# Patient Record
Sex: Female | Born: 1975
Health system: Southern US, Community
[De-identification: ages and names within clinical notes are randomized; demographics above are authoritative.]

## PROBLEM LIST (undated history)

## (undated) DIAGNOSIS — E119 Type 2 diabetes mellitus without complications: Secondary | ICD-10-CM

## (undated) DIAGNOSIS — F32A Depression, unspecified: Secondary | ICD-10-CM

## (undated) DIAGNOSIS — M549 Dorsalgia, unspecified: Secondary | ICD-10-CM

## (undated) DIAGNOSIS — D509 Iron deficiency anemia, unspecified: Secondary | ICD-10-CM

## (undated) DIAGNOSIS — F329 Major depressive disorder, single episode, unspecified: Secondary | ICD-10-CM

## (undated) DIAGNOSIS — G43909 Migraine, unspecified, not intractable, without status migrainosus: Secondary | ICD-10-CM

## (undated) DIAGNOSIS — E282 Polycystic ovarian syndrome: Secondary | ICD-10-CM

## (undated) DIAGNOSIS — F319 Bipolar disorder, unspecified: Secondary | ICD-10-CM

## (undated) DIAGNOSIS — M4712 Other spondylosis with myelopathy, cervical region: Secondary | ICD-10-CM

## (undated) DIAGNOSIS — E782 Mixed hyperlipidemia: Secondary | ICD-10-CM

## (undated) DIAGNOSIS — G43809 Other migraine, not intractable, without status migrainosus: Secondary | ICD-10-CM

## (undated) DIAGNOSIS — M4722 Other spondylosis with radiculopathy, cervical region: Secondary | ICD-10-CM

## (undated) DIAGNOSIS — I10 Essential (primary) hypertension: Secondary | ICD-10-CM

## (undated) DIAGNOSIS — F419 Anxiety disorder, unspecified: Secondary | ICD-10-CM

## (undated) DIAGNOSIS — G4733 Obstructive sleep apnea (adult) (pediatric): Secondary | ICD-10-CM

## (undated) DIAGNOSIS — E1165 Type 2 diabetes mellitus with hyperglycemia: Secondary | ICD-10-CM

## (undated) DIAGNOSIS — G5600 Carpal tunnel syndrome, unspecified upper limb: Secondary | ICD-10-CM

## (undated) DIAGNOSIS — T7840XA Allergy, unspecified, initial encounter: Secondary | ICD-10-CM

## (undated) HISTORY — DX: Migraine, unspecified, not intractable, without status migrainosus: G43.909

## (undated) HISTORY — PX: OTHER SURGICAL HISTORY: SHX169

## (undated) HISTORY — PX: TONSILLECTOMY: SUR1361

## (undated) HISTORY — DX: Allergy, unspecified, initial encounter: T78.40XA

## (undated) HISTORY — PX: PILONIDAL CYST EXCISION: SHX744

## (undated) HISTORY — PX: SKIN BIOPSY: SHX1

## (undated) HISTORY — DX: Anxiety disorder, unspecified: F41.9

## (undated) HISTORY — DX: Depression, unspecified: F32.A

## (undated) HISTORY — DX: Type 2 diabetes mellitus without complications: E11.9

## (undated) HISTORY — PX: OVARIAN CYST REMOVAL: SHX89

## (undated) HISTORY — DX: Major depressive disorder, single episode, unspecified: F32.9

---
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
## 1998-02-03 HISTORY — PX: LAPAROSCOPY: SHX197

## 2004-02-04 HISTORY — PX: COLONOSCOPY: SHX174

## 2007-05-05 HISTORY — PX: ABDOMINAL HYSTERECTOMY: SHX81

## 2009-08-22 ENCOUNTER — Ambulatory Visit: Payer: Self-pay | Admitting: Family Medicine

## 2010-08-22 ENCOUNTER — Emergency Department: Payer: Self-pay | Admitting: Pediatrics

## 2010-11-13 ENCOUNTER — Ambulatory Visit: Payer: Self-pay | Admitting: Oncology

## 2011-05-06 ENCOUNTER — Inpatient Hospital Stay: Payer: Self-pay | Admitting: Psychiatry

## 2011-05-06 LAB — COMPREHENSIVE METABOLIC PANEL
Albumin: 3.8 g/dL (ref 3.4–5.0)
Alkaline Phosphatase: 76 U/L (ref 50–136)
Anion Gap: 10 (ref 7–16)
Chloride: 106 mmol/L (ref 98–107)
Co2: 26 mmol/L (ref 21–32)
Creatinine: 0.73 mg/dL (ref 0.60–1.30)
EGFR (Non-African Amer.): >60
Glucose: 81 mg/dL (ref 65–99)
Osmolality: 282 (ref 275–301)
Potassium: 3.8 mmol/L (ref 3.5–5.1)
SGOT(AST): 90 U/L — ABNORMAL HIGH (ref 15–37)

## 2011-05-06 LAB — DRUG SCREEN, URINE
Benzodiazepine, Ur Scrn: NEGATIVE (ref ?–200)
Cannabinoid 50 Ng, Ur ~~LOC~~: NEGATIVE (ref ?–50)
Cocaine Metabolite,Ur ~~LOC~~: NEGATIVE (ref ?–300)
Opiate, Ur Screen: NEGATIVE (ref ?–300)
Phencyclidine (PCP) Ur S: NEGATIVE (ref ?–25)
Tricyclic, Ur Screen: NEGATIVE (ref ?–1000)

## 2011-05-06 LAB — CBC
HCT: 41.2 % (ref 35.0–47.0)
HGB: 13.9 g/dL (ref 12.0–16.0)
MCH: 31.4 pg (ref 26.0–34.0)
MCV: 93 fL (ref 80–100)

## 2011-05-06 LAB — WET PREP, GENITAL

## 2011-05-06 LAB — ACETAMINOPHEN LEVEL: Acetaminophen: <2 ug/mL

## 2011-05-06 LAB — SALICYLATE LEVEL: Salicylates, Serum: <1.7 mg/dL

## 2011-05-06 LAB — ETHANOL: Ethanol %: <0.003 % (ref 0.000–0.080)

## 2011-06-25 ENCOUNTER — Emergency Department: Payer: Self-pay | Admitting: Unknown Physician Specialty

## 2011-06-25 LAB — URINALYSIS, COMPLETE
Bilirubin,UR: NEGATIVE
Glucose,UR: NEGATIVE mg/dL (ref 0–75)
Ketone: NEGATIVE
Leukocyte Esterase: NEGATIVE
Ph: 5 (ref 4.5–8.0)
RBC,UR: <1 /HPF (ref 0–5)
Specific Gravity: 1.015 (ref 1.003–1.030)
Squamous Epithelial: 2

## 2011-06-25 LAB — CBC
HGB: 13.4 g/dL (ref 12.0–16.0)
MCV: 91 fL (ref 80–100)
Platelet: 215 10*3/uL (ref 150–440)
RBC: 4.27 10*6/uL (ref 3.80–5.20)
WBC: 8.2 10*3/uL (ref 3.6–11.0)

## 2011-06-25 LAB — COMPREHENSIVE METABOLIC PANEL
Albumin: 3.5 g/dL (ref 3.4–5.0)
Alkaline Phosphatase: 77 U/L (ref 50–136)
BUN: 13 mg/dL (ref 7–18)
Calcium, Total: 9.3 mg/dL (ref 8.5–10.1)
Chloride: 100 mmol/L (ref 98–107)
Co2: 29 mmol/L (ref 21–32)
EGFR (African American): >60
EGFR (Non-African Amer.): >60
Osmolality: 275 (ref 275–301)
SGOT(AST): 45 U/L — ABNORMAL HIGH (ref 15–37)
SGPT (ALT): 47 U/L
Sodium: 137 mmol/L (ref 136–145)

## 2011-06-25 LAB — LIPASE, BLOOD: Lipase: 122 U/L (ref 73–393)

## 2012-06-03 ENCOUNTER — Emergency Department: Payer: Self-pay | Admitting: Emergency Medicine

## 2012-06-03 LAB — URINALYSIS, COMPLETE
Bilirubin,UR: NEGATIVE
Blood: NEGATIVE
Glucose,UR: NEGATIVE mg/dL (ref 0–75)
Leukocyte Esterase: NEGATIVE
Ph: 9 (ref 4.5–8.0)
RBC,UR: 1 /HPF (ref 0–5)
Specific Gravity: 1.018 (ref 1.003–1.030)
Squamous Epithelial: 11
WBC UR: 1 /HPF (ref 0–5)

## 2012-06-03 LAB — CBC
HCT: 36.4 % (ref 35.0–47.0)
HGB: 12.8 g/dL (ref 12.0–16.0)
MCHC: 35 g/dL (ref 32.0–36.0)
Platelet: 298 10*3/uL (ref 150–440)

## 2012-06-03 LAB — BASIC METABOLIC PANEL
BUN: 10 mg/dL (ref 7–18)
Chloride: 104 mmol/L (ref 98–107)
Creatinine: 0.68 mg/dL (ref 0.60–1.30)
EGFR (African American): >60
Glucose: 94 mg/dL (ref 65–99)
Osmolality: 276 (ref 275–301)
Potassium: 2.7 mmol/L — ABNORMAL LOW (ref 3.5–5.1)
Sodium: 139 mmol/L (ref 136–145)

## 2012-06-08 ENCOUNTER — Emergency Department: Payer: Self-pay | Admitting: Emergency Medicine

## 2012-06-08 LAB — URINALYSIS, COMPLETE
Bacteria: NONE SEEN
Blood: NEGATIVE
Glucose,UR: NEGATIVE mg/dL (ref 0–75)
Leukocyte Esterase: NEGATIVE
Nitrite: NEGATIVE
Protein: NEGATIVE
RBC,UR: 4 /HPF (ref 0–5)
Specific Gravity: 1.023 (ref 1.003–1.030)
WBC UR: 1 /HPF (ref 0–5)

## 2012-06-08 LAB — COMPREHENSIVE METABOLIC PANEL
Albumin: 3.8 g/dL (ref 3.4–5.0)
Alkaline Phosphatase: 75 U/L (ref 50–136)
Anion Gap: 9 (ref 7–16)
Bilirubin,Total: 0.3 mg/dL (ref 0.2–1.0)
Chloride: 102 mmol/L (ref 98–107)
Creatinine: 0.73 mg/dL (ref 0.60–1.30)
Glucose: 109 mg/dL — ABNORMAL HIGH (ref 65–99)
Potassium: 2.9 mmol/L — ABNORMAL LOW (ref 3.5–5.1)
SGPT (ALT): 49 U/L (ref 12–78)
Sodium: 136 mmol/L (ref 136–145)
Total Protein: 7.9 g/dL (ref 6.4–8.2)

## 2012-06-08 LAB — CBC
HCT: 38.1 % (ref 35.0–47.0)
HGB: 13.5 g/dL (ref 12.0–16.0)
MCH: 32.2 pg (ref 26.0–34.0)
MCHC: 35.5 g/dL (ref 32.0–36.0)
MCV: 91 fL (ref 80–100)
WBC: 10.9 10*3/uL (ref 3.6–11.0)

## 2012-06-08 LAB — MAGNESIUM: Magnesium: 1.7 mg/dL — ABNORMAL LOW

## 2013-05-09 DIAGNOSIS — R1310 Dysphagia, unspecified: Secondary | ICD-10-CM | POA: Insufficient documentation

## 2013-05-09 DIAGNOSIS — J029 Acute pharyngitis, unspecified: Secondary | ICD-10-CM | POA: Insufficient documentation

## 2013-07-23 ENCOUNTER — Emergency Department: Payer: Self-pay | Admitting: Emergency Medicine

## 2013-12-05 ENCOUNTER — Ambulatory Visit: Payer: Self-pay | Admitting: Physician Assistant

## 2013-12-15 DIAGNOSIS — S60529A Blister (nonthermal) of unspecified hand, initial encounter: Secondary | ICD-10-CM | POA: Insufficient documentation

## 2013-12-15 DIAGNOSIS — M722 Plantar fascial fibromatosis: Secondary | ICD-10-CM | POA: Insufficient documentation

## 2014-02-18 ENCOUNTER — Emergency Department: Payer: Self-pay

## 2014-05-28 NOTE — H&P (Signed)
PATIENT NAME:  Nicole Cervantes, Nicole Cervantes MR#:  [id number] DATE OF BIRTH:  Jan 25, 1976  DATE OF ADMISSION:  05/06/2011  CHIEF COMPLAINT AND IDENTIFYING DATA: Nicole Cervantes is a 39 year old female presenting to the Emergency Department with complaints of depression and wishing that a car would swerve into her. She also was having thoughts of cutting herself.   HISTORY OF PRESENT ILLNESS: She was able to obtain a full time job approximately one month ago. She continues with bill stress. She has a son who has had psychiatric disorders. She was experiencing normal mood until approximately seven days ago when she began to experience progressive drop in her mood along with decreased energy and difficulty concentrating. It progressed to crying easily and the symptoms described above.   She has no hallucinations or delusions. Her orientation and memory function are intact. She does have a history of hypomania. However, she has not experienced any recent elevation of mood. She has continued to take her Paxil 40 mg daily. She had to leave work because she could not take her symptoms anymore. She also notes that she has had a change in the nature of her dreams. She is having nightmares. Other thoughts of killing herself include the method of overdose.   PAST PSYCHIATRIC HISTORY: Nicole Cervantes does have a history of several episodes of elevated mood, some lasting longer than seven days where she will experience racing thoughts, euphoria, increased goal-directed activity, and increased self esteem. She also has experienced short periods of elevated mood lasting 1 or 2 days followed by dropping back into a deep depression. These elevated mood periods have occurred without antidepressants and without stimulants.   She has no periods of elevated mood involving any destructive activities. She does state that when she has an elevated mood she is like the "head of the pack" at her work place.   She acknowledges a suicide attempt  of cutting herself when she was 39 years old. She was admitted to a psychiatric hospital at that time.   She has no history of hallucinations or delusions. She denies any history of psychotropic medication other than Paxil. She has remained on Paxil 40 mg q. a.m.   She was in psychotherapy. Her last psychotherapy was with Jeronimo Norma at [place]. This discontinued in November 2012.   FAMILY PSYCHIATRIC HISTORY: She has two sisters diagnosed with bipolar disorder. Also, her son has been diagnosed with psychiatric conditions and has been treated. He has been admitted to [facility] in [place].   SOCIAL HISTORY: Nicole Cervantes lives with a supportive fiance and her 15 year old son. She does not use alcohol or illegal drugs. She has never been incarcerated or arrested. She works at a [employer].   MEDICATIONS:  1. Paxil 40 mg daily.  2. Neurontin 300 mg t.i.d. 3. Hydrochlorothiazide 12.5 mg daily.  PAST MEDICAL HISTORY:   1. Nicole Cervantes has a history of cervical neuropathy with pressure on nerves from bulges on the left side of her neck. Her neuropathy symptoms involve some tingling and numbness throughout the left upper extremity. There is no loss of muscle strength. The paresthesias and numbness come and go intermittently and have been relieved with physical therapy. She and her provider have opted to not use surgery yet.   2. She has hypertension.  ALLERGIES: No known drug allergies.    REVIEW OF SYSTEMS: Constitutional, HEENT, mouth, neurologic, psychiatric, cardiovascular, respiratory, gastrointestinal, genitourinary, skin, musculoskeletal, hematologic, lymphatic, endocrine, metabolic all unremarkable.   PHYSICAL EXAMINATION:  VITAL  SIGNS: Temperature 97.2, pulse 71, respiratory rate 18, blood pressure 153/103.   The patient is examined with female staff escort.   GENERAL APPEARANCE: Nicole Cervantes is a well-developed, well-nourished, middle-aged female lying in a supine  position on her hospital gurney with no abnormal involuntary movements. She has no cachexia. Her muscle tone is normal. Her grooming and hygiene are normal.   HEENT: Normocephalic, atraumatic. Pupils equally round and reactive to light and accommodation. Oropharynx clear without erythema. Head supple, nontender, no masses.   RESPIRATORY: Clear to auscultation. No wheezing, rhonchi, or rales.   EXTREMITIES: No cyanosis, clubbing, or edema.   SKIN: Normal turgor. No rashes.   CARDIOVASCULAR: Regular rate and rhythm. No murmurs, rubs, or gallops.   ABDOMEN: Bowel sounds positive. Soft, nontender.   GENITOURINARY: Deferred.   NEUROLOGIC: Cranial nerves II through XII intact. General sensory intact throughout to light touch. Motor 5/5 throughout. Deep tendon reflexes normal strength and symmetry throughout except slightly decreased at the left elbow compared to the right elbow. Coordination intact bilaterally to finger-to-nose. No Babinski.   MENTAL STATUS EXAM: Nicole Cervantes is alert. Her eye contact is good. Her concentration is mildly decreased. She is oriented to all spheres. Her memory is intact to immediate, recent, and remote. Her fund of knowledge, use of language, and intelligence are normal. Speech is mildly slowed. She has normal prosody and no dysarthria. Thought process is mildly slowed, logical, coherent, and goal-directed. No looseness of associations or tangents. Thought content: Please see the history of present illness. She has no thoughts of harming herself, delusions, or hallucinations. Affect is constricted with tears. Mood is depressed. Insight is intact. Judgment is intact for the need of psychiatric care.   ASSESSMENT:  AXIS I:  Bipolar II disorder, depressed.   AXIS II:  Deferred.  AXIS III: Hypertension. Please see the past medical history.   AXIS IV: Occupational, general medical.   AXIS V:  30.  Nicole Cervantes does have a history of suicide attempts. She has had  a recurrence of severe depression symptoms refractory to ongoing treatment. She would be at risk to attempt suicide outside of the supportive environment of the hospital.   PLAN:  1. We will admit Nicole Cervantes to the inpatient behavioral unit. She will undergo milieu and group psychotherapy.  2. The indications, alternatives, and adverse effects of the following were discussed with Nicole Cervantes: Lamictal as a mood stabilizer, including the risk of lethal rash; Paxil as her antidepressant. She understands and wants to proceed with Lamictal and Paxil.  3. We will continue Paxil at 40 mg daily. We will start Lamictal at 25 mg daily.  4. She will have followup with her primary care physician after hospitalization.   ____________________________ Drue Stager. Jhoselin Crume, MD jsw:bjt D: 05/06/2011 14:13:13 ET T: 05/06/2011 14:31:22 ET JOB#: [id number]  cc: Drue Stager. Miarose Lippert, MD, <Dictator> Billie Ruddy MD ELECTRONICALLY SIGNED 05/08/2011 20:31

## 2014-05-28 NOTE — Discharge Summary (Signed)
PATIENT NAME:  Nicole Cervantes, Nicole Cervantes MR#:  [id number] DATE OF BIRTH:  12-13-1975  DATE OF ADMISSION:  05/06/2011 DATE OF DISCHARGE:  05/08/2011  HISTORY OF PRESENT ILLNESS: [name] is a 39 year old female admitted to the inpatient behavioral health unit due to acute depression as well as suicidal thoughts. She was having thoughts of cutting herself and she was also wishing that a car would swerve into her. She had been experiencing seven days of progressive drop in her mood along with decreased energy and difficulty concentrating. She had been crying easily.   Please see the admission History and Physical dictation.   ANCILLARY CLINICAL DATA:  The patient continued to demonstrate mild hypertension despite her HCTZ dosing. By April 4 she was still showing 149/96 and standing 134/94. Norvasc 5 mg daily was added to her regimen upon discharge. She is to follow up with her primary care physician within the week of discharge.   HOSPITAL COURSE: [name] was admitted to the inpatient behavioral health unit and underwent milieu and group psychotherapy. She was started on Lamictal at 25 mg every a.m. She continued on her Paxil 40 mg daily. She did require Vistaril for anti- acute insomnia. Her mood progressively improved.   CONDITION ON DISCHARGE: By the fourth of April [name] is sleeping well and she is very relieved because she has not slept well in weeks. Her energy is normal. Concentration is normal. Her crying has stopped. She has constructive future goals and interests. She is tolerating her Lamictal well without adverse effect. She is looking forward to returning to work.   MENTAL STATUS EXAM UPON DISCHARGE: [name] is alert. Her eye contact is good. Her concentration is normal. She is oriented to all spheres. Her memory is intact to immediate, recent, and remote. Fund of knowledge, intelligence, and use of language are normal. Speech involves normal rate and prosody without  dysarthria. Thought process is logical, coherent, and goal directed. No looseness of associations or tangents. Thought content: No thoughts of harming herself, no thoughts of harming others. No delusions or hallucinations. Affect broad and appropriate. Mood within normal limits. Insight intact. Judgment intact.    DISCHARGE DIAGNOSES:  AXIS I:  Bipolar II disorder, depressed, in clinical remission.   AXIS II: Deferred.   AXIS III: Hypertension.   AXIS IV: Occupational, general medical.   AXIS V: 55.   [name] is not at risk to harm herself or others. She agrees to call emergency services immediately for any thoughts of harming herself, thoughts of harming others, or distress.   She agrees to not drive if drowsy.   DISCHARGE MEDICATIONS:  1. Vistaril 25 mg, 1 to 3 p.o. at bedtime p.r.n. insomnia 10-day supply. 2. Paxil 40 mg daily, 10-day supply. 3. Lamotrigine 25 mg, 1 daily, 10-day supply. 4. Neurontin 300 mg, 1  t.i.d., 10-day supply.  5. HCTZ 12.5 mg daily, 10-day supply. 6. Norvasc 5 mg daily, 10-day supply.   DIET:  Regular.  ACTIVITY:  Routine.  FOLLOWUP:  1. She will follow up with [facility] on 04/08 at 11:30 a.m.  2. She will follow up with her primary care physician within seven days of discharge.   ____________________________ Drue Stager. Jalena Vanderlinden, MD jsw:bjt D: 05/09/2011 21:20:49 ET T: 05/12/2011 12:39:57 ET JOB#: [id number] Billie Ruddy MD ELECTRONICALLY SIGNED 05/15/2011 0:06

## 2014-09-15 ENCOUNTER — Ambulatory Visit
Admission: EM | Admit: 2014-09-15 | Discharge: 2014-09-15 | Disposition: A | Discharge status: Home / Self Care | Payer: Worker's Compensation | Attending: Family Medicine | Admitting: Family Medicine

## 2014-09-15 ENCOUNTER — Encounter: Payer: Self-pay | Admitting: Emergency Medicine

## 2014-09-15 DIAGNOSIS — M545 Low back pain, unspecified: Secondary | ICD-10-CM

## 2014-09-15 DIAGNOSIS — M5090 Cervical disc disorder, unspecified, unspecified cervical region: Secondary | ICD-10-CM | POA: Diagnosis not present

## 2014-09-15 HISTORY — DX: Essential (primary) hypertension: I10

## 2014-09-15 MED ORDER — KETOROLAC TROMETHAMINE 60 MG/2ML IM SOLN: 60.0000 mg | Freq: Once | INTRAMUSCULAR | Status: DC

## 2014-09-15 MED ORDER — MELOXICAM 7.5 MG PO TABS
7.5000 mg | ORAL_TABLET | Freq: Two times a day (BID) | ORAL | Status: DC
Start: 2014-09-15 — End: 2014-09-21

## 2014-09-15 MED ORDER — ORPHENADRINE CITRATE ER 100 MG PO TB12: 100.0000 mg | ORAL_TABLET | Freq: Two times a day (BID) | ORAL | Status: DC

## 2014-09-15 MED ORDER — KETOROLAC TROMETHAMINE 60 MG/2ML IM SOLN
60.0000 mg | Freq: Once | INTRAMUSCULAR | Status: AC
  Administered 2014-09-15: 60 mg via INTRAMUSCULAR

## 2014-09-15 MED ORDER — HYDROCODONE-ACETAMINOPHEN 5-325 MG PO TABS: 1.0000 | ORAL_TABLET | Freq: Every evening | ORAL | Status: DC | PRN

## 2014-09-15 NOTE — ED Notes (Signed)
Pt lifting boxes now with lower back pain with arms numbness

## 2014-09-15 NOTE — ED Provider Notes (Addendum)
CSN: [id number]     Arrival date & time 09/15/14  0840 History   First MD Initiated Contact with Patient 09/15/14 [id number]     Chief Complaint  Patient presents with  . Back Pain   (Consider location/radiation/quality/duration/timing/severity/associated sxs/prior Treatment) HPI 39 yo F reports lifting 30 lb buckets at work yesterday -moving them from a cart to shelving up to shoulder high. She  States she was "OK at Work" but during the night her neck and back developed pain. Also reports fleeting moments of numbness and tingling In both legs on the "area you touch when you hang your arms /hands down".  Also noted intermittent left upper arm numbness and reports both hands had tingling and numbness during the night . Used naproxen at home without improvement..Denies bladder or bowel difficulty, no incontinence. This morning she accomplished full self care and drove herself to work. Became increasingly uncomfortable and was sent in for evaluation.  Patient  gives history of bulging cervical  disc C4-5-6 in 2012 with left arm symptoms that had resolved in interim. Has 2015 hx of back pain presumed from work environment, approx 1 year ago.  Husband with recent cardiac diagnosis and both are on weight loss program and exercise. She has had 8 pound loss intentionally. Norvasc and HCTZ 25 mg  Past Medical History  Diagnosis Date  . Hypertension    Past Surgical History  Procedure Laterality Date  . Tonsillectomy    . Skin biopsy    . Abdominal hysterectomy  2009  . Ovarian cyst removal     Family History  Problem Relation Age of Onset  . Diabetes Mother   . Bladder Cancer Father   . Prostate cancer Father   . Diabetes Father    Social History  Substance Use Topics  . Smoking status: Never Smoker   . Smokeless tobacco: Never Used  . Alcohol Use: No   OB History    No data available     Review of Systems Constitutional: No fever.  Eyes: No visual changes. ENT:No sore  throat. Cardiovascular:Negative for chest pain/palpitations Respiratory: Negative for shortness of breath Gastrointestinal: No abdominal pain. No nausea,vomiting, diarrhea Genitourinary: Negative for dysuria. Normal urination. Musculoskeletal: Negative for neck discomfort, positive paresthesia left lateral upper arm intermittent. Positive  for  Low back pain. Reporting fleeting paresthesia lateral lower extremities Skin: Negative for rash Neurological: Negative for headache, focal weakness. Numbness/tingling as reported  Allergies  Review of patient's allergies indicates no known allergies.  Home Medications   Prior to Admission medications   Medication Sig Start Date End Date Taking? Authorizing Provider  amLODipine (NORVASC) 10 MG tablet Take 10 mg by mouth daily.   Yes Historical Provider, MD  hydrochlorothiazide (HYDRODIURIL) 25 MG tablet Take 25 mg by mouth daily.   Yes Historical Provider, MD  PARoxetine (PAXIL) 40 MG tablet Take 40 mg by mouth every morning.   Yes Historical Provider, MD  HYDROcodone-acetaminophen (NORCO/VICODIN) 5-325 MG per tablet Take 1-2 tablets by mouth at bedtime and may repeat dose one time if needed. 09/15/14   Jan Fireman, PA-C  meloxicam (MOBIC) 7.5 MG tablet Take 1 tablet (7.5 mg total) by mouth 2 (two) times daily. 09/15/14   Jan Fireman, PA-C  orphenadrine (NORFLEX) 100 MG tablet Take 1 tablet (100 mg total) by mouth 2 (two) times daily. 09/15/14   Jan Fireman, PA-C   BP 124/84 mmHg  Pulse 77  Temp(Src) 97.9 F (36.6 C) (Tympanic)  Resp 18  Ht 5' 4.5" (1.638 m)  Wt 224 lb (101.606 kg)  BMI 37.87 kg/m2  SpO2 100% Physical Exam   Constitutional -alert and oriented,reports moderate distress- she is shaking her fingers and reporting tingling; moving carefully and guarding low back.  Can sit and stand -either for short periods of time, transition between positions is uncomfortable; supine on table comfortable with knees flexed Head-atraumatic,  normocephalic Eyes- conjunctiva normal, EOMI ,conjugate gaze Ears - Neg bilat Nose- no congestion or rhinorrhea Mouth/throat- mucous membranes moist ,oropharynx non-erythematous Neck- supple without glandular enlargement CV- regular rate, grossly normal heart sounds,  Resp-no distress, normal respiratory effort,clear to auscultation bilaterally Back- no CVAT, palpation of lumbar paraspinous area is tender GI- soft,non-tender,no distention GU- not examined MSK- can move all extremities without pain in the extremity; fleeting paresthesia does not appear related to position or function;  SLR bilaterally causes localized right low back discomfort without radiation, good pulses Neuro- normal speech and language, no gross focal neurological deficit appreciated, Hyper-reflexia noted bilateral LEs, good plantar flexion, no foot drop ;  climbs on and off table without assistance CNs as tested are WNL Skin-warm,dry ,intact; no rash noted Psych-mood and affect grossly normal; speech and behavior grossly normal ED Course  Procedures (including critical care time) Labs Review Labs Reviewed - No data to display  Imaging Review No results found.  Toradol 60 mg given IM - well tolerated by patient   Patient reviewed with Dr Alveta Heimlich including history and physical findings. Impression of exacerbation of disc disease and low back pain. Treat for muscle spasm, anti-inflammmatory - may use evening dose of Vicodin MDM   1. Midline low back pain without sciatica   2. Cervical disc disease    Exacerbation of disc disease suspected. Diagnosis and treatment discussed. . Questions fielded, expectations and recommendations reviewed.  Patient expresses understanding. Will return to [facility] with questions, concern or exacerbation.   Discharge Medication List as of 09/15/2014 10:44 AM    START taking these medications   Details  HYDROcodone-acetaminophen (NORCO/VICODIN) 5-325 MG per tablet Take 1-2 tablets by  mouth at bedtime and may repeat dose one time if needed., Starting 09/15/2014, Until Discontinued, Print    meloxicam (MOBIC) 7.5 MG tablet Take 1 tablet (7.5 mg total) by mouth 2 (two) times daily., Starting 09/15/2014, Until Discontinued, Print    orphenadrine (NORFLEX) 100 MG tablet Take 1 tablet (100 mg total) by mouth 2 (two) times daily., Starting 09/15/2014, Until Discontinued, Print        Jan Fireman, PA-C 09/18/14 [place], PA-C 10/18/14 1255

## 2014-09-15 NOTE — Discharge Instructions (Signed)
Muscle relaxer 1 tablet at bedtime- may add one in the morning if not driving or operating machinery Anti-inflammatory 7.5mg  1 twice a day  Vicodin 1-2 tablets at bedtime as needed for pain, may repeat once during day  Heat pad/ice pack/ modified activity No heavy lifting greater than 5 pounds Firm support mattress  Out of work today.Return Monday August 15 with restrictions    Back Pain, Adult Low back pain is very common. About 1 in 5 people have back pain.The cause of low back pain is rarely dangerous. The pain often gets better over time.About half of people with a sudden onset of back pain feel better in just 2 weeks. About 8 in 10 people feel better by 6 weeks.  CAUSES Some common causes of back pain include:  Strain of the muscles or ligaments supporting the spine.  Wear and tear (degeneration) of the spinal discs.  Arthritis.  Direct injury to the back. DIAGNOSIS Most of the time, the direct cause of low back pain is not known.However, back pain can be treated effectively even when the exact cause of the pain is unknown.Answering your caregiver's questions about your overall health and symptoms is one of the most accurate ways to make sure the cause of your pain is not dangerous. If your caregiver needs more information, he or she may order lab work or imaging tests (X-rays or MRIs).However, even if imaging tests show changes in your back, this usually does not require surgery. HOME CARE INSTRUCTIONS For many people, back pain returns.Since low back pain is rarely dangerous, it is often a condition that people can learn to [facility] their own.   Remain active. It is stressful on the back to sit or stand in one place. Do not sit, drive, or stand in one place for more than 30 minutes at a time. Take short walks on level surfaces as soon as pain allows.Try to increase the length of time you walk each day.  Do not stay in bed.Resting more than 1 or 2 days can delay your  recovery.  Do not avoid exercise or work.Your body is made to move.It is not dangerous to be active, even though your back may hurt.Your back will likely heal faster if you return to being active before your pain is gone.  Pay attention to your body when you bend and lift. Many people have less discomfortwhen lifting if they bend their knees, keep the load close to their bodies,and avoid twisting. Often, the most comfortable positions are those that put less stress on your recovering back.  Find a comfortable position to sleep. Use a firm mattress and lie on your side with your knees slightly bent. If you lie on your back, put a pillow under your knees.  Only take over-the-counter or prescription medicines as directed by your caregiver. Over-the-counter medicines to reduce pain and inflammation are often the most helpful.Your caregiver may prescribe muscle relaxant drugs.These medicines help dull your pain so you can more quickly return to your normal activities and healthy exercise.  Put ice on the injured area.  Put ice in a plastic bag.  Place a towel between your skin and the bag.  Leave the ice on for 15-20 minutes, 03-04 times a day for the first 2 to 3 days. After that, ice and heat may be alternated to reduce pain and spasms.  Ask your caregiver about trying back exercises and gentle massage. This may be of some benefit.  Avoid feeling anxious or stressed.Stress  increases muscle tension and can worsen back pain.It is important to recognize when you are anxious or stressed and learn ways to manage it.Exercise is a great option. SEEK MEDICAL CARE IF:  You have pain that is not relieved with rest or medicine.  You have pain that does not improve in 1 week.  You have new symptoms.  You are generally not feeling well. SEEK IMMEDIATE MEDICAL CARE IF:   You have pain that radiates from your back into your legs.  You develop new bowel or bladder control problems.  You  have unusual weakness or numbness in your arms or legs.  You develop nausea or vomiting.  You develop abdominal pain.  You feel faint. Document Released: 01/20/2005 Document Revised: 07/22/2011 Document Reviewed: 05/24/2013 [facility] Patient Information 2015 [place], [place]. This information is not intended to replace advice given to you by your health care provider. Make sure you discuss any questions you have with your health care provider.

## 2014-09-21 ENCOUNTER — Ambulatory Visit: Payer: Worker's Compensation

## 2014-09-21 ENCOUNTER — Encounter: Payer: Self-pay | Admitting: Emergency Medicine

## 2014-09-21 ENCOUNTER — Ambulatory Visit
Admission: EM | Admit: 2014-09-21 | Discharge: 2014-09-21 | Disposition: A | Discharge status: Home / Self Care | Payer: Worker's Compensation | Attending: Emergency Medicine | Admitting: Emergency Medicine

## 2014-09-21 DIAGNOSIS — I10 Essential (primary) hypertension: Secondary | ICD-10-CM | POA: Diagnosis not present

## 2014-09-21 DIAGNOSIS — X58XXXA Exposure to other specified factors, initial encounter: Secondary | ICD-10-CM | POA: Insufficient documentation

## 2014-09-21 DIAGNOSIS — S39012D Strain of muscle, fascia and tendon of lower back, subsequent encounter: Secondary | ICD-10-CM

## 2014-09-21 DIAGNOSIS — S39012A Strain of muscle, fascia and tendon of lower back, initial encounter: Secondary | ICD-10-CM | POA: Insufficient documentation

## 2014-09-21 DIAGNOSIS — M545 Low back pain: Secondary | ICD-10-CM | POA: Diagnosis present

## 2014-09-21 MED ORDER — PREDNISONE 20 MG PO TABS: ORAL_TABLET | ORAL | Status: DC

## 2014-09-21 MED ORDER — KETOROLAC TROMETHAMINE 60 MG/2ML IM SOLN
60.0000 mg | Freq: Once | INTRAMUSCULAR | Status: AC
  Administered 2014-09-21: 60 mg via INTRAMUSCULAR

## 2014-09-21 MED ORDER — DICLOFENAC SODIUM 75 MG PO TBEC: 75.0000 mg | DELAYED_RELEASE_TABLET | Freq: Two times a day (BID) | ORAL | Status: DC

## 2014-09-21 MED ORDER — METHOCARBAMOL 500 MG PO TABS: 500.0000 mg | ORAL_TABLET | Freq: Four times a day (QID) | ORAL | Status: DC

## 2014-09-21 NOTE — ED Notes (Signed)
Patient here for follow-up visit to a back injury that occurred at her work.  Patient states that her lower back pain and not improved but has actually worsen.

## 2014-09-21 NOTE — ED Provider Notes (Signed)
HPI  SUBJECTIVE:  SHAYLINN HLADIK is a 39 y.o. female who presents with persistent midline low back pain starting after injuring her back at work, 8 days ago. States that she was lifting heavy buckets, lowering then down onto lower shelves and lifting him up on middle shelf, and doing a lot of torso rotation. She now reports sore, stabbing pain in her mid lower back, muscle spasms, states pain becomes throbbing with walking. Her symptoms are worse with walking, lifting leg/hip flexion, going from sitting to standing. no alleviating factors. The pain is not associated with torso rotation or bending forward. She tried Norco, Mobic, and Norflex. Last dose of Mobic was this morning. She reports complete body numbness 4 days ago while walking. States it lasted approximately 4 hours and has now resolved, after she started taking the Mobic, Norflex. She states the numbness has resolved, but the back pain is getting worse. She denies neck pain, arm weakness, shoulder pain. She denies nausea, vomiting, fevers, urinary complaints, urinary or fecal incontinence, abdominal pain, saddle anesthesia, lower extremity weakness, pain shooting down her legs, pain worse at night, unintentional weight loss, history of osteopenia, osteoporosis, cancer, IV drug use, AAA, recent bacteremia. Past medical history of hypertension, bulging discs C4-C6, spina bifida. No history of diabetes.   She was seen here on 8/13, no imaging was done at that time.  She was thought to have acute back strain, aggravation of DJD in her C-spine, sent home with Vicodin, Mobic, Norflex.   Past Medical History  Diagnosis Date  . Hypertension     Past Surgical History  Procedure Laterality Date  . Tonsillectomy    . Skin biopsy    . Abdominal hysterectomy  2009  . Ovarian cyst removal      Family History  Problem Relation Age of Onset  . Diabetes Mother   . Bladder Cancer Father   . Prostate cancer Father   . Diabetes Father     Social  History  Substance Use Topics  . Smoking status: Never Smoker   . Smokeless tobacco: Never Used  . Alcohol Use: No    No current facility-administered medications for this encounter.  Current outpatient prescriptions:  .  amLODipine (NORVASC) 10 MG tablet, Take 10 mg by mouth daily., Disp: , Rfl:  .  diclofenac (VOLTAREN) 75 MG EC tablet, Take 1 tablet (75 mg total) by mouth 2 (two) times daily. Take with food, Disp: 30 tablet, Rfl: 0 .  hydrochlorothiazide (HYDRODIURIL) 25 MG tablet, Take 25 mg by mouth daily., Disp: , Rfl:  .  methocarbamol (ROBAXIN) 500 MG tablet, Take 1 tablet (500 mg total) by mouth 4 (four) times daily., Disp: 40 tablet, Rfl: 0 .  PARoxetine (PAXIL) 40 MG tablet, Take 40 mg by mouth every morning., Disp: , Rfl:  .  predniSONE (DELTASONE) 20 MG tablet, Take 3 tabs po on first day, 2 tabs second day, 2 tabs third day, 1 tab fourth day, 1 tab 5th day. Take with food., Disp: 9 tablet, Rfl: 0  No Known Allergies   ROS  As noted in HPI.   Physical Exam  BP 124/86 mmHg  Pulse 78  Temp(Src) 98.5 F (36.9 C) (Tympanic)  Resp 16  Ht 5\' 4"  (1.626 m)  Wt 220 lb (99.791 kg)  BMI 37.74 kg/m2  SpO2 98%  Constitutional: Well developed, well nourished, no acute distress Eyes:  EOMI, conjunctiva normal bilaterally HENT: Normocephalic, atraumatic,mucus membranes moist Respiratory: Normal inspiratory effort Cardiovascular: Normal rate GI:  nondistended, soft, nontender.  Back: No bony tenderness along C-spine, no trapezial tenderness, muscle spasm, patient able to do FROM neck,  No CVA tenderness  skin: No rash, skin intact Musculoskeletal: - paralumbar tenderness, - muscle spasm. + bony tenderness L4-S1. Bilateral lower extremities nontender without new rashes or color change, baseline ROM with intact DP pulses. Pain aggravated with left hip extension, knee extension  No pain with int/ext rotation flex/extension hips bilaterally. SLR neg bilaterally. Sensation baseline  light touch bilaterally for Pt, DTR's symmetric and intact bilaterally KJ, Motor symmetric bilateral 5/5 hip flexion, quadriceps, hamstrings, EHL, foot dorsiflexion, foot plantarflexion, gait somewhat antalgic but without apparent new ataxia. No SIJ tenderness.  Bilateral shoulder, arm exam within normal limits, strength 5/5 bilaterally, sensation grossly intact b/l, Neurologic: Alert & oriented x 3, no focal neuro deficits Psychiatric: Speech and behavior appropriate   ED Course   Medications  ketorolac (TORADOL) injection 60 mg (60 mg Intramuscular Given 09/21/14 1458)    Orders Placed This Encounter  Procedures  . DG Lumbar Spine Complete    Standing Status: Standing     Number of Occurrences: 1     Standing Expiration Date:     Order Specific Question:  Reason for Exam (SYMPTOM  OR DIAGNOSIS REQUIRED)    Answer:  L4 S1 tenderness s/p repetitive use     Comments:  L4-S1 bony tenderness r/o fx dislocation djd bony lesions, etc    No results found for this or any previous visit (from the past 24 hour(s)). Dg Lumbar Spine Complete  09/21/2014   CLINICAL DATA:  Low back pain.  Low back tenderness.  EXAM: LUMBAR SPINE - COMPLETE 4+ VIEW  COMPARISON:  CT scan abdomen dated 06/25/2011  FINDINGS: There is no fracture, subluxation, disc space narrowing or severe facet arthritis. Slight degenerative changes of the facet joints at L5-S1.  IMPRESSION: Slight degenerative facet arthritis at L5-S1.   Electronically Signed   By: Lorriane Shire M.D.   On: 09/21/2014 14:39    ED Clinical Impression   Low back strain, subsequent encounter  ED Assessment/Plan  Previous records reviewed. As noted in history of present illness  Giving Toradol. X-raying back due to bony tenderness. There are no neurologic deficits on my exam. Doubt cauda equina.  Reviewed imaging independently. No fracture dislocation. Slight degenerative facet arthritis per radiology.  See radiology report for full  details.  We'll send home with some steroids, diclofenac, we can try different muscle relaxants. Advised follow-up with physical therapy and referral to orthopedics if not better in one week, advised deep tissue massage. Filled out worker's comp paperwork.  Discussed imaging, MDM, plan and followup with patient. Discussed sn/sx that should prompt return to the UC or ED. Patient  agrees with plan.  *This clinic note was created using Dragon dictation software. Therefore, there may be occasional mistakes despite careful proofreading.  ?   Melynda Ripple, MD 09/21/14 2114

## 2014-09-21 NOTE — Discharge Instructions (Signed)
Follow-up with a physical therapist. They can be very helpful in the acute phase of back pain. Talk to your company about which physical therapist they can recommended. Also, follow up with you have numbness between your thighs, if you the accidentally urinate or defecate on yourself, for pain not controlled with the medications, or for other concerns. Also, try deep tissue massage. Kneaded therapy or [facility] in [place] are very good practices.

## 2014-10-05 ENCOUNTER — Telehealth: Payer: Self-pay

## 2014-10-05 NOTE — ED Notes (Signed)
Pt called and states she is having difficulty with getting worker's compensation follow up appointments ordered (PT and surgical ortho) here at [facility]. She states she has left multiple messages for her Human Resources person and for adjustor at [employer] comp carrier. She states "I am getting the run around. I have not had one call returned from [facility] in [place]." Encouraged patient to call them again, and return here if necessary.

## 2014-10-10 NOTE — ED Notes (Signed)
Patient called stating HR/WCB has not given her an appointment with PT or Orthopedic. Is requesting a note that can go "back to work while waiting for or having PT". Instructed to contact HR AND WCB carrier and speak to them about getting appointments set up as discussed with Provider at [facility]

## 2014-11-16 ENCOUNTER — Other Ambulatory Visit: Payer: Self-pay | Admitting: Student

## 2014-11-16 DIAGNOSIS — M5416 Radiculopathy, lumbar region: Secondary | ICD-10-CM

## 2014-11-20 ENCOUNTER — Encounter: Payer: Self-pay | Admitting: Emergency Medicine

## 2014-11-20 ENCOUNTER — Emergency Department
Admission: EM | Admit: 2014-11-20 | Discharge: 2014-11-20 | Disposition: A | Discharge status: Home / Self Care | Payer: Commercial Managed Care - HMO | Attending: Emergency Medicine | Admitting: Emergency Medicine

## 2014-11-20 DIAGNOSIS — Z79899 Other long term (current) drug therapy: Secondary | ICD-10-CM | POA: Diagnosis not present

## 2014-11-20 DIAGNOSIS — Z791 Long term (current) use of non-steroidal anti-inflammatories (NSAID): Secondary | ICD-10-CM | POA: Diagnosis not present

## 2014-11-20 DIAGNOSIS — M5412 Radiculopathy, cervical region: Secondary | ICD-10-CM | POA: Insufficient documentation

## 2014-11-20 DIAGNOSIS — M25511 Pain in right shoulder: Secondary | ICD-10-CM | POA: Diagnosis present

## 2014-11-20 DIAGNOSIS — I10 Essential (primary) hypertension: Secondary | ICD-10-CM | POA: Diagnosis not present

## 2014-11-20 MED ORDER — MELOXICAM 15 MG PO TABS: 15.0000 mg | ORAL_TABLET | Freq: Every day | ORAL | Status: DC

## 2014-11-20 NOTE — ED Notes (Signed)
Developed pain to right shoulder that is radiating into neck couple of days ago. Denies any specific injury but uses her right arm a lot at work. No swelling or deformity noted

## 2014-11-20 NOTE — ED Notes (Signed)
Pain to right shoulder and neck.  Hurts when looking left.  Onset of symptoms last Tuesday.  No relief from Ice or Naprosyn or Robaxin.  Robaxin last taken yesterday at noon.  Naprosyn last take this morning at 0600.  Patient denies injury.

## 2014-11-20 NOTE — Discharge Instructions (Signed)
Cervical Radiculopathy Cervical radiculopathy happens when a nerve in the neck (cervical nerve) is pinched or bruised. This condition can develop because of an injury or as part of the normal aging process. Pressure on the cervical nerves can cause pain or numbness that runs from the neck all the way down into the arm and fingers. Usually, this condition gets better with rest. Treatment may be needed if the condition does not improve.  CAUSES This condition may be caused by:  Injury.  Slipped (herniated) disk.  Muscle tightness in the neck because of overuse.  Arthritis.  Breakdown or degeneration in the bones and joints of the spine (spondylosis) due to aging.  Bone spurs that may develop near the cervical nerves. SYMPTOMS Symptoms of this condition include:  Pain that runs from the neck to the arm and hand. The pain can be severe or irritating. It may be worse when the neck is moved.  Numbness or weakness in the affected arm and hand. DIAGNOSIS This condition may be diagnosed based on symptoms, medical history, and a physical exam. You may also have tests, including:  X-rays.  CT scan.  MRI.  Electromyogram (EMG).  Nerve conduction tests. TREATMENT In many cases, treatment is not needed for this condition. With rest, the condition usually gets better over time. If treatment is needed, options may include:  Wearing a soft neck collar for short periods of time.  Physical therapy to strengthen your neck muscles.  Medicines, such as NSAIDs, oral corticosteroids, or spinal injections.  Surgery. This may be needed if other treatments do not help. Various types of surgery may be done depending on the cause of your problems. HOME CARE INSTRUCTIONS Managing Pain  Take over-the-counter and prescription medicines only as told by your health care provider.  If directed, apply ice to the affected area.  Put ice in a plastic bag.  Place a towel between your skin and the  bag.  Leave the ice on for 20 minutes, 2-3 times per day.  If ice does not help, you can try using heat. Take a warm shower or warm bath, or use a heat pack as told by your health care provider.  Try a gentle neck and shoulder massage to help relieve symptoms. Activity  Rest as needed. Follow instructions from your health care provider about any restrictions on activities.  Do stretching and strengthening exercises as told by your health care provider or physical therapist. General Instructions  If you were given a soft collar, wear it as told by your health care provider.  Use a flat pillow when you sleep.  Keep all follow-up visits as told by your health care provider. This is important. SEEK MEDICAL CARE IF:  Your condition does not improve with treatment. SEEK IMMEDIATE MEDICAL CARE IF:  Your pain gets much worse and cannot be controlled with medicines.  You have weakness or numbness in your hand, arm, face, or leg.  You have a high fever.  You have a stiff, rigid neck.  You lose control of your bowels or your bladder (have incontinence).  You have trouble with walking, balance, or speaking.   This information is not intended to replace advice given to you by your health care provider. Make sure you discuss any questions you have with your health care provider.   Document Released: 10/15/2000 Document Revised: 10/11/2014 Document Reviewed: 03/16/2014 Elsevier Interactive Patient Education 2016 Elsevier [employer]  Epidural Steroid Injection An epidural steroid injection is given to relieve pain in  your neck, back, or legs that is caused by the irritation or swelling of a nerve root. This procedure involves injecting a steroid and numbing medicine (anesthetic) into the epidural space. The epidural space is the space between the outer covering of your spinal cord and the bones that form your backbone (vertebra).  LET [facility] CARE PROVIDER KNOW ABOUT:   Any allergies you  have.  All medicines you are taking, including vitamins, herbs, eye drops, creams, and over-the-counter medicines such as aspirin.  Previous problems you or members of your family have had with the use of anesthetics.  Any blood disorders or blood clotting disorders you have.  Previous surgeries you have had.  Medical conditions you have. RISKS AND COMPLICATIONS Generally, this is a safe procedure. However, as with any procedure, complications can occur. Possible complications of epidural steroid injection include:  Headache.  Bleeding.  Infection.  Allergic reaction to the medicines.  Damage to your nerves. The response to this procedure depends on the underlying cause of the pain and its duration. People who have long-term (chronic) pain are less likely to benefit from epidural steroids than are those people whose pain comes on strong and suddenly. BEFORE THE PROCEDURE   Ask your health care provider about changing or stopping your regular medicines. You may be advised to stop taking blood-thinning medicines a few days before the procedure.  You may be given medicines to reduce anxiety.  Arrange for someone to take you home after the procedure. PROCEDURE   You will remain awake during the procedure. You may receive medicine to make you relaxed.  You will be asked to lie on your stomach.  The injection site will be cleaned.  The injection site will be numbed with a medicine (local anesthetic).  A needle will be injected through your skin into the epidural space.  Your health care provider will use an X-ray machine to ensure that the steroid is delivered closest to the affected nerve. You may have minimal discomfort at this time.  Once the needle is in the right position, the local anesthetic and the steroid will be injected into the epidural space.  The needle will then be removed and a bandage will be applied to the injection site. AFTER THE PROCEDURE   You may be  monitored for a short time before you go home.  You may feel weakness or numbness in your arm or leg, which disappears within hours.  You may be allowed to eat, drink, and take your regular medicine.  You may have soreness at the site of the injection.   This information is not intended to replace advice given to you by your health care provider. Make sure you discuss any questions you have with your health care provider.   Document Released: 04/29/2007 Document Revised: 09/22/2012 Document Reviewed: 07/09/2012 Elsevier Interactive Patient Education [employer].

## 2014-11-20 NOTE — ED Provider Notes (Signed)
[facility] Emergency Department Provider Note  ____________________________________________  Time seen: Approximately 6:30 PM  I have reviewed the triage vital signs and the nursing notes.   HISTORY  Chief Complaint Shoulder Pain     HPI Nicole Cervantes is a 39 y.o. female who presents to the emergency department complaining of right shoulder pain. She states that she has a history of "bulging disc between C3 and C6." She has had similar symptoms on the left side for which she receives cortisone injections. She states that the symptoms on her right side began 5 days ago and had increased in the intervening period. That she takes Robaxin at night and Naprosyn twice a day and neither medication has improved her symptoms.States pain is constant, burning sensation, worse with movement. He denies any injury to area.   Past Medical History  Diagnosis Date  . Hypertension     There are no active problems to display for this patient.   Past Surgical History  Procedure Laterality Date  . Tonsillectomy    . Skin biopsy    . Abdominal hysterectomy  2009  . Ovarian cyst removal      Current Outpatient Rx  Name  Route  Sig  Dispense  Refill  . amLODipine (NORVASC) 10 MG tablet   Oral   Take 10 mg by mouth daily.         . diclofenac (VOLTAREN) 75 MG EC tablet   Oral   Take 1 tablet (75 mg total) by mouth 2 (two) times daily. Take with food   30 tablet   0   . hydrochlorothiazide (HYDRODIURIL) 25 MG tablet   Oral   Take 25 mg by mouth daily.         . meloxicam (MOBIC) 15 MG tablet   Oral   Take 1 tablet (15 mg total) by mouth daily.   30 tablet   0   . methocarbamol (ROBAXIN) 500 MG tablet   Oral   Take 1 tablet (500 mg total) by mouth 4 (four) times daily.   40 tablet   0   . PARoxetine (PAXIL) 40 MG tablet   Oral   Take 40 mg by mouth every morning.         . predniSONE (DELTASONE) 20 MG tablet      Take 3 tabs po on first day, 2  tabs second day, 2 tabs third day, 1 tab fourth day, 1 tab 5th day. Take with food.   9 tablet   0     Allergies Review of patient's allergies indicates no known allergies.  Family History  Problem Relation Age of Onset  . Diabetes Mother   . Bladder Cancer Father   . Prostate cancer Father   . Diabetes Father     Social History Social History  Substance Use Topics  . Smoking status: Never Smoker   . Smokeless tobacco: Never Used  . Alcohol Use: No    Review of Systems Constitutional: No fever/chills Eyes: No visual changes. ENT: No sore throat. Cardiovascular: Denies chest pain. Respiratory: Denies shortness of breath. Gastrointestinal: No abdominal pain.  No nausea, no vomiting.  No diarrhea.  No constipation. Genitourinary: Negative for dysuria. Musculoskeletal: Negative for back pain. Endorses right shoulder pain. Skin: Negative for rash. Neurological: Negative for headaches, focal weakness or numbness.  10-point ROS otherwise negative.  ____________________________________________   PHYSICAL EXAM:  VITAL SIGNS: ED Triage Vitals  Enc Vitals Group     BP 11/20/14  1648 136/94 mmHg     Pulse Rate 11/20/14 1648 98     Resp 11/20/14 1648 16     Temp 11/20/14 1648 97.2 F (36.2 C)     Temp Source 11/20/14 1648 Oral     SpO2 11/20/14 1648 97 %     Weight 11/20/14 1648 230 lb (104.327 kg)     Height 11/20/14 1648 5\' 4"  (1.626 m)     Head Cir --      Peak Flow --      Pain Score 11/20/14 1650 9     Pain Loc --      Pain Edu? --      Excl. in [place]? --     Constitutional: Alert and oriented. Well appearing and in no acute distress. Eyes: Conjunctivae are normal. PERRL. EOMI. Head: Atraumatic. Nose: No congestion/rhinnorhea. Mouth/Throat: Mucous membranes are moist.  Oropharynx non-erythematous. Neck: No stridor.  No cervical spine tenderness to palpation. Cardiovascular: Normal rate, regular rhythm. Grossly normal heart sounds.  Good peripheral  circulation. Respiratory: Normal respiratory effort.  No retractions. Lungs CTAB. Gastrointestinal: Soft and nontender. No distention. No abdominal bruits. No CVA tenderness. Musculoskeletal: No lower extremity tenderness nor edema.  No joint effusions. No visible deformity to right shoulder. No palpable deformity to the skeletal components. Tender palpation over the scapula but no muscle spasms are noted. Full range of motion to shoulder. Pulses and sensation are intact distally Neurologic:  Normal speech and language. No gross focal neurologic deficits are appreciated. No gait instability. Skin:  Skin is warm, dry and intact. No rash noted. Psychiatric: Mood and affect are normal. Speech and behavior are normal.  ____________________________________________   LABS (all labs ordered are listed, but only abnormal results are displayed)  Labs Reviewed - No data to display ____________________________________________  EKG   ____________________________________________  RADIOLOGY   ____________________________________________   PROCEDURES  Procedure(s) performed: None  Critical Care performed: No  ____________________________________________   INITIAL IMPRESSION / ASSESSMENT AND PLAN / ED COURSE  Pertinent labs & imaging results that were available during my care of the patient were reviewed by me and considered in my medical decision making (see chart for details).  Patient's history, symptoms, and physical exam are consistent with cervical radiculopathy on the right. She is advised of findings and diagnosis and she verbalizes understanding of same. I advised the patient I would place her on stronger anti-inflammatories for symptom control but that she would need to follow up with orthopedics. Patient verbalizes understanding and compliance with treatment plan. She states that she will follow-up with her orthopedic surgeon Dr. Bess Harvest at the first available  time. ____________________________________________   FINAL CLINICAL IMPRESSION(S) / ED DIAGNOSES  Final diagnoses:  Cervical radiculopathy      Darletta Moll, PA-C 11/20/14 1847  Orbie Pyo, MD 11/20/14 2158

## 2014-11-27 ENCOUNTER — Ambulatory Visit: Payer: Self-pay

## 2014-11-29 ENCOUNTER — Ambulatory Visit: Payer: Commercial Managed Care - HMO

## 2015-01-06 ENCOUNTER — Emergency Department
Admission: EM | Admit: 2015-01-06 | Discharge: 2015-01-06 | Disposition: A | Discharge status: Home / Self Care | Payer: Commercial Managed Care - HMO | Attending: Emergency Medicine | Admitting: Emergency Medicine

## 2015-01-06 ENCOUNTER — Encounter: Payer: Self-pay | Admitting: Emergency Medicine

## 2015-01-06 DIAGNOSIS — M545 Low back pain: Secondary | ICD-10-CM | POA: Insufficient documentation

## 2015-01-06 DIAGNOSIS — I10 Essential (primary) hypertension: Secondary | ICD-10-CM | POA: Insufficient documentation

## 2015-01-06 DIAGNOSIS — G8929 Other chronic pain: Secondary | ICD-10-CM

## 2015-01-06 DIAGNOSIS — Z79899 Other long term (current) drug therapy: Secondary | ICD-10-CM | POA: Insufficient documentation

## 2015-01-06 DIAGNOSIS — M549 Dorsalgia, unspecified: Secondary | ICD-10-CM

## 2015-01-06 HISTORY — DX: Dorsalgia, unspecified: M54.9

## 2015-01-06 MED ORDER — MELOXICAM 15 MG PO TABS: 15.0000 mg | ORAL_TABLET | Freq: Every day | ORAL | Status: DC

## 2015-01-06 MED ORDER — KETOROLAC TROMETHAMINE 60 MG/2ML IM SOLN
60.0000 mg | Freq: Once | INTRAMUSCULAR | Status: AC
  Administered 2015-01-06: 60 mg via INTRAMUSCULAR
  Filled 2015-01-06: qty 2

## 2015-01-06 MED ORDER — OXYCODONE-ACETAMINOPHEN 5-325 MG PO TABS
1.0000 | ORAL_TABLET | Freq: Once | ORAL | Status: AC
  Administered 2015-01-06: 1 via ORAL
  Filled 2015-01-06: qty 1

## 2015-01-06 NOTE — ED Notes (Signed)
Pt presents with chronic back pain. Pt states pain became worse yesterday as driving. Pt states pain is worse when sitting and lying. Pt states pain is shooting down the back of both legs into feet.

## 2015-01-06 NOTE — ED Provider Notes (Signed)
[facility] Emergency Department Provider Note  ____________________________________________  Time seen: Approximately 7:40 AM  I have reviewed the triage vital signs and the nursing notes.   HISTORY  Chief Complaint Back Pain   HPI Nicole CIANNA Cervantes is a 39 y.o. female who presents to the emergency department for evaluation of acute on chronic back pain. She states that 2 days ago while riding in the car her chronic back pain that had been well-controlled suddenly began to hurt. She states that the pain is now traveling into both of her buttocks. She denies paresthesias. She denies loss of bowel or bladder control. She recently finished a 12 day prednisone taper. She also ran out of her meloxicam about 2 weeks ago. She has taken Tylenol without relief.She had an MRI of her lumbar spine in October and has been following with [facility]. She denies new injury.   Past Medical History  Diagnosis Date  . Hypertension   . Back pain     There are no active problems to display for this patient.   Past Surgical History  Procedure Laterality Date  . Tonsillectomy    . Skin biopsy    . Abdominal hysterectomy  2009  . Ovarian cyst removal      Current Outpatient Rx  Name  Route  Sig  Dispense  Refill  . amLODipine (NORVASC) 10 MG tablet   Oral   Take 10 mg by mouth daily.         . hydrochlorothiazide (HYDRODIURIL) 25 MG tablet   Oral   Take 25 mg by mouth daily.         . meloxicam (MOBIC) 15 MG tablet   Oral   Take 1 tablet (15 mg total) by mouth daily.   30 tablet   0   . methocarbamol (ROBAXIN) 500 MG tablet   Oral   Take 1 tablet (500 mg total) by mouth 4 (four) times daily.   40 tablet   0   . PARoxetine (PAXIL) 40 MG tablet   Oral   Take 40 mg by mouth every morning.         . predniSONE (DELTASONE) 20 MG tablet      Take 3 tabs po on first day, 2 tabs second day, 2 tabs third day, 1 tab fourth day, 1 tab 5th day. Take with  food.   9 tablet   0     Allergies Review of patient's allergies indicates no known allergies.  Family History  Problem Relation Age of Onset  . Diabetes Mother   . Bladder Cancer Father   . Prostate cancer Father   . Diabetes Father     Social History Social History  Substance Use Topics  . Smoking status: Never Smoker   . Smokeless tobacco: Never Used  . Alcohol Use: No    Review of Systems Constitutional: No recent illness. Eyes: No visual changes. ENT: No sore throat. Cardiovascular: Denies chest pain or palpitations. Respiratory: Denies shortness of breath. Gastrointestinal: No abdominal pain.  Genitourinary: Negative for dysuria. Musculoskeletal: Pain in lower back with radiation into bilateral buttocks Skin: Negative for rash. Neurological: Negative for headaches, focal weakness or numbness. 10-point ROS otherwise negative.  ____________________________________________   PHYSICAL EXAM:  VITAL SIGNS: ED Triage Vitals  Enc Vitals Group     BP 01/06/15 0736 150/112 mmHg     Pulse Rate 01/06/15 0736 92     Resp 01/06/15 0736 18     Temp 01/06/15  0739 98 F (36.7 C)     Temp Source 01/06/15 0739 Oral     SpO2 01/06/15 0736 99 %     Weight 01/06/15 0739 230 lb (104.327 kg)     Height 01/06/15 0739 5\' 4"  (1.626 m)     Head Cir --      Peak Flow --      Pain Score 01/06/15 0743 7     Pain Loc --      Pain Edu? --      Excl. in [place]? --     Constitutional: Alert and oriented. Well appearing and in no acute distress. Eyes: Conjunctivae are normal. EOMI. Head: Atraumatic. Nose: No congestion/rhinnorhea. Neck: No stridor.  Respiratory: Normal respiratory effort.   Musculoskeletal: Full range of motion of back and lower extremities. Straight leg raise is negative bilaterally. No loss of sensation. Neurologic:  Normal speech and language. No gross focal neurologic deficits are appreciated. Speech is normal. No gait instability. Skin:  Skin is warm, dry  and intact. Atraumatic. Psychiatric: Mood and affect are normal. Speech and behavior are normal.  ____________________________________________   LABS (all labs ordered are listed, but only abnormal results are displayed)  Labs Reviewed - No data to display ____________________________________________  RADIOLOGY  Not indicated ____________________________________________   PROCEDURES  Procedure(s) performed: None   ____________________________________________   INITIAL IMPRESSION / ASSESSMENT AND PLAN / ED COURSE  Pertinent labs & imaging results that were available during my care of the patient were reviewed by me and considered in my medical decision making (see chart for details).  IM Toradol and by mouth oxycodone were given while in the emergency department. She will be restarted on her meloxicam and advised to follow-up with her orthopedist. She was advised to return to the emergency department for symptoms that change or worsen if she is unable schedule an appointment. Of note, patient is asymptomatic with her hypertension and had just taken her medications prior to arrival. ____________________________________________   FINAL CLINICAL IMPRESSION(S) / ED DIAGNOSES  Final diagnoses:  Chronic back pain  Essential hypertension       Victorino Dike, FNP 01/06/15 1033  Carrie Mew, MD 01/06/15 [phone]

## 2015-01-06 NOTE — ED Notes (Signed)
Pt states has hypertension and has not taken medications this morning.

## 2015-04-26 LAB — HM PAP SMEAR

## 2015-04-26 LAB — HM MAMMOGRAPHY

## 2015-08-20 DIAGNOSIS — G5603 Carpal tunnel syndrome, bilateral upper limbs: Secondary | ICD-10-CM | POA: Diagnosis not present

## 2015-08-22 DIAGNOSIS — Z6841 Body Mass Index (BMI) 40.0 and over, adult: Secondary | ICD-10-CM | POA: Diagnosis not present

## 2015-08-22 DIAGNOSIS — F315 Bipolar disorder, current episode depressed, severe, with psychotic features: Secondary | ICD-10-CM | POA: Diagnosis not present

## 2015-08-24 DIAGNOSIS — F4000 Agoraphobia, unspecified: Secondary | ICD-10-CM | POA: Diagnosis not present

## 2015-08-24 DIAGNOSIS — F930 Separation anxiety disorder of childhood: Secondary | ICD-10-CM | POA: Diagnosis not present

## 2015-08-24 DIAGNOSIS — F3181 Bipolar II disorder: Secondary | ICD-10-CM | POA: Diagnosis not present

## 2015-08-31 ENCOUNTER — Encounter: Payer: Self-pay | Admitting: Emergency Medicine

## 2015-08-31 ENCOUNTER — Emergency Department: Payer: BLUE CROSS/BLUE SHIELD

## 2015-08-31 ENCOUNTER — Emergency Department
Admission: EM | Admit: 2015-08-31 | Discharge: 2015-08-31 | Disposition: A | Discharge status: Home / Self Care | Payer: BLUE CROSS/BLUE SHIELD | Attending: Emergency Medicine | Admitting: Emergency Medicine

## 2015-08-31 DIAGNOSIS — R1030 Lower abdominal pain, unspecified: Secondary | ICD-10-CM

## 2015-08-31 DIAGNOSIS — Z79899 Other long term (current) drug therapy: Secondary | ICD-10-CM | POA: Diagnosis not present

## 2015-08-31 DIAGNOSIS — R197 Diarrhea, unspecified: Secondary | ICD-10-CM | POA: Insufficient documentation

## 2015-08-31 DIAGNOSIS — K921 Melena: Secondary | ICD-10-CM | POA: Diagnosis not present

## 2015-08-31 DIAGNOSIS — K649 Unspecified hemorrhoids: Secondary | ICD-10-CM | POA: Diagnosis not present

## 2015-08-31 DIAGNOSIS — R1032 Left lower quadrant pain: Secondary | ICD-10-CM | POA: Diagnosis not present

## 2015-08-31 DIAGNOSIS — I10 Essential (primary) hypertension: Secondary | ICD-10-CM | POA: Insufficient documentation

## 2015-08-31 DIAGNOSIS — E876 Hypokalemia: Secondary | ICD-10-CM

## 2015-08-31 LAB — URINALYSIS COMPLETE WITH MICROSCOPIC (ARMC ONLY)
BACTERIA UA: NONE SEEN
Bilirubin Urine: NEGATIVE
Glucose, UA: NEGATIVE mg/dL
Ketones, ur: NEGATIVE mg/dL
Leukocytes, UA: NEGATIVE
Nitrite: NEGATIVE
Protein, ur: NEGATIVE mg/dL
RBC / HPF: NONE SEEN RBC/hpf (ref 0–5)
Specific Gravity, Urine: 1.015 (ref 1.005–1.030)
pH: 5 (ref 5.0–8.0)

## 2015-08-31 LAB — COMPREHENSIVE METABOLIC PANEL
ALT: 33 U/L (ref 14–54)
AST: 29 U/L (ref 15–41)
Albumin: 3.7 g/dL (ref 3.5–5.0)
Alkaline Phosphatase: 81 U/L (ref 38–126)
Anion gap: 7 (ref 5–15)
BUN: 12 mg/dL (ref 6–20)
CHLORIDE: 101 mmol/L (ref 101–111)
CO2: 30 mmol/L (ref 22–32)
Calcium: 9.4 mg/dL (ref 8.9–10.3)
Creatinine, Ser: 0.54 mg/dL (ref 0.44–1.00)
GFR calc Af Amer: >60 mL/min (ref >60)
GLUCOSE: 118 mg/dL — AB (ref 65–99)
POTASSIUM: 2.6 mmol/L — AB (ref 3.5–5.1)
Sodium: 138 mmol/L (ref 135–145)
Total Bilirubin: 0.5 mg/dL (ref 0.3–1.2)
Total Protein: 7.4 g/dL (ref 6.5–8.1)

## 2015-08-31 LAB — CBC
HEMATOCRIT: 39.1 % (ref 35.0–47.0)
Hemoglobin: 13.8 g/dL (ref 12.0–16.0)
MCH: 31.3 pg (ref 26.0–34.0)
MCHC: 35.2 g/dL (ref 32.0–36.0)
MCV: 89 fL (ref 80.0–100.0)
Platelets: 225 10*3/uL (ref 150–440)
RBC: 4.39 MIL/uL (ref 3.80–5.20)
RDW: 13 % (ref 11.5–14.5)
WBC: 11 10*3/uL (ref 3.6–11.0)

## 2015-08-31 LAB — TYPE AND SCREEN
ABO/RH(D): O POS
ANTIBODY SCREEN: NEGATIVE

## 2015-08-31 LAB — LIPASE, BLOOD: LIPASE: 24 U/L (ref 11–51)

## 2015-08-31 MED ORDER — SODIUM CHLORIDE 0.9 % IV BOLUS (SEPSIS)
1000.0000 mL | Freq: Once | INTRAVENOUS | Status: AC
  Administered 2015-08-31: 1000 mL via INTRAVENOUS

## 2015-08-31 MED ORDER — DIATRIZOATE MEGLUMINE & SODIUM 66-10 % PO SOLN
15.0000 mL | Freq: Once | ORAL | Status: AC
  Administered 2015-08-31: 15 mL via ORAL

## 2015-08-31 MED ORDER — IOPAMIDOL (ISOVUE-300) INJECTION 61%
100.0000 mL | Freq: Once | INTRAVENOUS | Status: AC | PRN
  Administered 2015-08-31: 100 mL via INTRAVENOUS
  Filled 2015-08-31: qty 100

## 2015-08-31 MED ORDER — POTASSIUM CHLORIDE CRYS ER 20 MEQ PO TBCR
40.0000 meq | EXTENDED_RELEASE_TABLET | Freq: Once | ORAL | Status: AC
  Administered 2015-08-31: 40 meq via ORAL
  Filled 2015-08-31: qty 2

## 2015-08-31 NOTE — ED Provider Notes (Signed)
[facility] Emergency Department Provider Note  ____________________________________________  Time seen: Approximately 4:52 PM  I have reviewed the triage vital signs and the nursing notes.   HISTORY  Chief Complaint Abdominal Pain    HPI Nicole Cervantes is a 40 y.o. female, NAD, presents to the emergency department with 2 week history of abdominal pain. States the abdominal pain has been present for a couple of weeks but has gotten more severe today, located in LLQ, and is accompanied by occasional nausea and headache. Also reports that for the last 2-3 days she has noticed a large amount of blood streaking the stool, turning toilet water red at times, and blood on toilet paper with clots. States that she has had about 6 bowel movements today that have been very loose with blood present in all episodes and 1 episode with mucus. Notes that stools throughout the day has gone from loose to being more formed but soft. Currently rates the pain as a 4/10. Has been eating and drinking without difficulty or changes. Denies sick contacts, vomiting, fever, chills, body aches, dysuria, hematuria, vaginal discharge, pelvic pain, decreased appetite, dizziness,  lightheadedness, chest pain, palpitations nor shortness of breath. Last colonoscopy was in [place] in 2007/2008 and was negative for any abnormalities. Family history is significant for diverticulosis and Crohn's disease.    Past Medical History:  Diagnosis Date  . Back pain   . Hypertension     There are no active problems to display for this patient.   Past Surgical History:  Procedure Laterality Date  . ABDOMINAL HYSTERECTOMY  2009  . OVARIAN CYST REMOVAL    . SKIN BIOPSY    . TONSILLECTOMY      Prior to Admission medications   Medication Sig Start Date End Date Taking? Authorizing Provider  amLODipine (NORVASC) 10 MG tablet Take 10 mg by mouth daily.    Historical Provider, MD  hydrochlorothiazide  (HYDRODIURIL) 25 MG tablet Take 25 mg by mouth daily.    Historical Provider, MD  meloxicam (MOBIC) 15 MG tablet Take 1 tablet (15 mg total) by mouth daily. 01/06/15   Victorino Dike, FNP  methocarbamol (ROBAXIN) 500 MG tablet Take 1 tablet (500 mg total) by mouth 4 (four) times daily. 09/21/14   Melynda Ripple, MD  PARoxetine (PAXIL) 40 MG tablet Take 40 mg by mouth every morning.    Historical Provider, MD  predniSONE (DELTASONE) 20 MG tablet Take 3 tabs po on first day, 2 tabs second day, 2 tabs third day, 1 tab fourth day, 1 tab 5th day. Take with food. 09/21/14   Melynda Ripple, MD    Allergies Review of patient's allergies indicates no known allergies.  Family History  Problem Relation Age of Onset  . Diabetes Mother   . Bladder Cancer Father   . Prostate cancer Father   . Diabetes Father     Social History Social History  Substance Use Topics  . Smoking status: Never Smoker  . Smokeless tobacco: Never Used  . Alcohol use No     Review of Systems  Constitutional: No fever/chills or decreased appetite.  Eyes: No visual changes. No discharge ENT: No sore throat. Cardiovascular: No chest pain. Respiratory: No cough. No shortness of breath. No wheezing.  Gastrointestinal: Positive LLQ abdominal pain, nausea, increased frequency of loose stools with blood and mucus. No vomiting, constipation, rectal pain/pressure.  Genitourinary: Negative for dysuria. No hematuria. No urinary hesitancy, urgency or increased frequency. Musculoskeletal: Negative for general myalgias.  Skin: Negative for rash, redness, swelling, skin sores, and open wounds.  Neurological: Positive for headaches. Negative for focal weakness or numbness. 10-point ROS otherwise negative.  ____________________________________________   PHYSICAL EXAM:  VITAL SIGNS: ED Triage Vitals  Enc Vitals Group     BP 08/31/15 1457 135/81     Pulse Rate 08/31/15 1457 89     Resp 08/31/15 1457 18     Temp 08/31/15  1457 97.7 F (36.5 C)     Temp src --      SpO2 08/31/15 1457 97 %     Weight 08/31/15 1457 230 lb (104.3 kg)     Height 08/31/15 1457 5\' 4"  (1.626 m)     Head Circumference --      Peak Flow --      Pain Score 08/31/15 1458 8     Pain Loc --      Pain Edu? --      Excl. in [place]? --     Constitutional: Alert and oriented. Well appearing and in no acute distress. Eyes: Conjunctivae are normal.  Head: Atraumatic. Neck: Supple, full range of motion  Hematological/Lymphatic/Immunilogical: No cervical lymphadenopathy. Cardiovascular: Normal rate, regular rhythm. Normal S1 and S2. No murmurs, rubs, or gallops. Good peripheral circulation. Respiratory: Normal respiratory effort without tachypnea or retractions. Lungs CTAB with breath sounds noted in all lung fields. . Gastrointestinal: LLQ and RLQ tenderness on deep palpation but the areas are soft without distention or guarding. All other quadrants of the abdomen are soft, nontender without distention or guarding.  No rigidity or distention. Bowel sounds quiet but without abnormal tones.  Rectal: Normal rectal tone. One external hemorrhoid that is inflamed but no active bleeding. Soft stool in the vault with no visualized blood and hemoccult negative.  Musculoskeletal: No lower extremity tenderness nor edema.  Neurologic:  Normal speech and language. No gross focal neurologic deficits are appreciated.  Skin:  Skin is warm, dry and intact. No rash noted. Psychiatric: Mood and affect are normal. Speech and behavior are normal. Patient exhibits appropriate insight and judgement.   ____________________________________________   LABS (all labs ordered are listed, but only abnormal results are displayed)  Labs Reviewed  COMPREHENSIVE METABOLIC PANEL - Abnormal; Notable for the following:       Result Value   Potassium 2.6 (*)    Glucose, Bld 118 (*)    All other components within normal limits  URINALYSIS COMPLETEWITH MICROSCOPIC ([facility]  ONLY) - Abnormal; Notable for the following:    Color, Urine YELLOW (*)    APPearance CLEAR (*)    Hgb urine dipstick 1+ (*)    Squamous Epithelial / LPF 0-5 (*)    All other components within normal limits  LIPASE, BLOOD  CBC  POC URINE PREG, ED  POC OCCULT BLOOD, ED  TYPE AND SCREEN    POC Occult blood is negative ____________________________________________  EKG  None ____________________________________________  RADIOLOGY I have personally viewed and evaluated these images (plain radiographs) as part of my medical decision making, as well as reviewing the written report by the radiologist.  Ct Abdomen Pelvis W Contrast  Result Date: 08/31/2015 CLINICAL DATA:  Abdominal pain, hematochezia, left lower quadrant pain. EXAM: CT ABDOMEN AND PELVIS WITH CONTRAST TECHNIQUE: Multidetector CT imaging of the abdomen and pelvis was performed using the standard protocol following bolus administration of intravenous contrast. CONTRAST:  183mL ISOVUE-300 IOPAMIDOL (ISOVUE-300) INJECTION 61% COMPARISON:  06/25/2011 FINDINGS: Lower chest: Lung bases are clear. No effusions. Heart is normal  size. Hepatobiliary: Small hepatic cysts in the right hepatic lobe. Gallbladder is contracted. Pancreas: No focal abnormality or ductal dilatation. Spleen: No focal abnormality.  Normal size. Adrenals/Urinary Tract: No adrenal abnormality. No focal renal abnormality. No stones or hydronephrosis. Urinary bladder is unremarkable. Stomach/Bowel: Appendix is normal. Stomach, large and small bowel grossly unremarkable. Vascular/Lymphatic: No evidence of aneurysm or adenopathy. Reproductive: Prior hysterectomy. Small bilateral ovarian cysts, the largest on the right measuring 3.5 cm. Other: No free fluid or free air. Musculoskeletal: No acute bony abnormality or focal bone lesion. IMPRESSION: No acute findings in the abdomen or pelvis. Small bilateral ovarian cysts, the largest on the right at 3.5 cm. Electronically Signed    By: Rolm Baptise M.D.   On: 08/31/2015 18:05   ____________________________________________    PROCEDURES  Procedure(s) performed: None   Procedures   Medications  potassium chloride SA (K-DUR,KLOR-CON) CR tablet 40 mEq (40 mEq Oral Given 08/31/15 1724)  sodium chloride 0.9 % bolus 1,000 mL (1,000 mLs Intravenous New Bag/Given 08/31/15 1726)  diatrizoate meglumine-sodium (GASTROGRAFIN) 66-10 % solution 15 mL (15 mLs Oral Given 08/31/15 1727)  iopamidol (ISOVUE-300) 61 % injection 100 mL (100 mLs Intravenous Contrast Given 08/31/15 1752)     ____________________________________________   INITIAL IMPRESSION / ASSESSMENT AND PLAN / ED COURSE  Pertinent labs & imaging results that were available during my care of the patient were reviewed by me and considered in my medical decision making (see chart for details). All lab work resulted grossly within normal limits except for a low potassium at 2.6. Patient was administered 40 mEq of potassium while in the emergency department and tolerated well. CT scan showed no evidence of any acute infection, inflammation or perforation that would cause patient's pain. Bilateral ovarian cysts were noted which could be contributing to the patient's pain.  Clinical Course    Patient's diagnosis is consistent with lower abdominal pain, hematochezia, hemorrhoids, diarrhea and hypokalemia. Patient will be discharged home with detailed instructions to care for management conservatively of symptoms. May take over-the-counter probiotics as needed to decrease abdominal pain and improved bowel movements. Patient given information in regards to food she may eat to keep her potassium levels normal. Patient is to follow up with Dr Allen Norris, and GI in 3 days for further evaluation and treatment. Patient is given ED precautions to return to the ED for any worsening or new symptoms.      ____________________________________________  FINAL CLINICAL IMPRESSION(S) / ED  DIAGNOSES  Final diagnoses:  Hematochezia  Lower abdominal pain  Hemorrhoids, unspecified hemorrhoid type  Diarrhea, unspecified type  Hypokalemia      NEW MEDICATIONS STARTED DURING THIS VISIT:  New Prescriptions   No medications on file         Braxton Feathers, PA-C 08/31/15 [place], MD 08/31/15 2054

## 2015-08-31 NOTE — ED Triage Notes (Signed)
Patient arrives POV to [facility] ed with complaint of abd pain for 2 weeks, worsening today. Patient states the pain radiates from the LLQ into her umbilicus. Patient states she is having diarrhea with a large amount of bright read blood with clots

## 2015-08-31 NOTE — ED Notes (Signed)
See triage note  Developed some lower bad pain which is increased on the left  Pain radiates from LLQ to umbilicus

## 2015-09-21 DIAGNOSIS — F930 Separation anxiety disorder of childhood: Secondary | ICD-10-CM | POA: Diagnosis not present

## 2015-09-21 DIAGNOSIS — F4000 Agoraphobia, unspecified: Secondary | ICD-10-CM | POA: Diagnosis not present

## 2015-09-21 DIAGNOSIS — F3181 Bipolar II disorder: Secondary | ICD-10-CM | POA: Diagnosis not present

## 2015-09-24 DIAGNOSIS — F930 Separation anxiety disorder of childhood: Secondary | ICD-10-CM | POA: Diagnosis not present

## 2015-09-24 DIAGNOSIS — F3181 Bipolar II disorder: Secondary | ICD-10-CM | POA: Diagnosis not present

## 2015-09-24 DIAGNOSIS — F4000 Agoraphobia, unspecified: Secondary | ICD-10-CM | POA: Diagnosis not present

## 2015-10-10 DIAGNOSIS — F930 Separation anxiety disorder of childhood: Secondary | ICD-10-CM | POA: Diagnosis not present

## 2015-10-10 DIAGNOSIS — F3181 Bipolar II disorder: Secondary | ICD-10-CM | POA: Diagnosis not present

## 2015-10-10 DIAGNOSIS — F4000 Agoraphobia, unspecified: Secondary | ICD-10-CM | POA: Diagnosis not present

## 2015-10-23 ENCOUNTER — Emergency Department
Admission: EM | Admit: 2015-10-23 | Discharge: 2015-10-24 | Disposition: A | Discharge status: Other Acute Inpatient Hospital | Payer: BLUE CROSS/BLUE SHIELD | Attending: Emergency Medicine | Admitting: Emergency Medicine

## 2015-10-23 ENCOUNTER — Encounter: Payer: Self-pay | Admitting: Emergency Medicine

## 2015-10-23 DIAGNOSIS — F99 Mental disorder, not otherwise specified: Secondary | ICD-10-CM | POA: Diagnosis not present

## 2015-10-23 DIAGNOSIS — I10 Essential (primary) hypertension: Secondary | ICD-10-CM | POA: Diagnosis not present

## 2015-10-23 DIAGNOSIS — F332 Major depressive disorder, recurrent severe without psychotic features: Secondary | ICD-10-CM | POA: Diagnosis not present

## 2015-10-23 DIAGNOSIS — Z6841 Body Mass Index (BMI) 40.0 and over, adult: Secondary | ICD-10-CM | POA: Diagnosis not present

## 2015-10-23 DIAGNOSIS — R45851 Suicidal ideations: Secondary | ICD-10-CM

## 2015-10-23 DIAGNOSIS — F314 Bipolar disorder, current episode depressed, severe, without psychotic features: Secondary | ICD-10-CM | POA: Diagnosis not present

## 2015-10-23 DIAGNOSIS — Z79899 Other long term (current) drug therapy: Secondary | ICD-10-CM | POA: Diagnosis not present

## 2015-10-23 DIAGNOSIS — Z791 Long term (current) use of non-steroidal anti-inflammatories (NSAID): Secondary | ICD-10-CM | POA: Insufficient documentation

## 2015-10-23 DIAGNOSIS — M549 Dorsalgia, unspecified: Secondary | ICD-10-CM

## 2015-10-23 DIAGNOSIS — F329 Major depressive disorder, single episode, unspecified: Secondary | ICD-10-CM | POA: Insufficient documentation

## 2015-10-23 DIAGNOSIS — F32A Depression, unspecified: Secondary | ICD-10-CM

## 2015-10-23 HISTORY — DX: Bipolar disorder, unspecified: F31.9

## 2015-10-23 LAB — COMPREHENSIVE METABOLIC PANEL
ALBUMIN: 3.8 g/dL (ref 3.5–5.0)
ALK PHOS: 64 U/L (ref 38–126)
ALT: 30 U/L (ref 14–54)
AST: 29 U/L (ref 15–41)
Anion gap: 7 (ref 5–15)
BUN: 15 mg/dL (ref 6–20)
CALCIUM: 9.1 mg/dL (ref 8.9–10.3)
CO2: 30 mmol/L (ref 22–32)
CREATININE: 0.63 mg/dL (ref 0.44–1.00)
Chloride: 103 mmol/L (ref 101–111)
GFR calc Af Amer: >60 mL/min (ref >60)
GFR calc non Af Amer: >60 mL/min (ref >60)
GLUCOSE: 87 mg/dL (ref 65–99)
Potassium: 3 mmol/L — ABNORMAL LOW (ref 3.5–5.1)
SODIUM: 140 mmol/L (ref 135–145)
Total Bilirubin: 0.4 mg/dL (ref 0.3–1.2)
Total Protein: 7.4 g/dL (ref 6.5–8.1)

## 2015-10-23 LAB — CBC
HEMATOCRIT: 40.7 % (ref 35.0–47.0)
HEMOGLOBIN: 14.3 g/dL (ref 12.0–16.0)
MCH: 31.8 pg (ref 26.0–34.0)
MCHC: 35 g/dL (ref 32.0–36.0)
MCV: 90.6 fL (ref 80.0–100.0)
Platelets: 245 10*3/uL (ref 150–440)
RBC: 4.49 MIL/uL (ref 3.80–5.20)
RDW: 13.2 % (ref 11.5–14.5)
WBC: 12.8 10*3/uL — ABNORMAL HIGH (ref 3.6–11.0)

## 2015-10-23 LAB — URINE DRUG SCREEN, QUALITATIVE (ARMC ONLY)
Amphetamines, Ur Screen: NOT DETECTED
BARBITURATES, UR SCREEN: NOT DETECTED
Benzodiazepine, Ur Scrn: NOT DETECTED
COCAINE METABOLITE, UR ~~LOC~~: NOT DETECTED
Cannabinoid 50 Ng, Ur ~~LOC~~: NOT DETECTED
MDMA (ECSTASY) UR SCREEN: NOT DETECTED
METHADONE SCREEN, URINE: NOT DETECTED
OPIATE, UR SCREEN: NOT DETECTED
Phencyclidine (PCP) Ur S: NOT DETECTED
Tricyclic, Ur Screen: NOT DETECTED

## 2015-10-23 LAB — SALICYLATE LEVEL: Salicylate Lvl: <4 mg/dL (ref 2.8–30.0)

## 2015-10-23 LAB — ACETAMINOPHEN LEVEL

## 2015-10-23 LAB — ETHANOL: Alcohol, Ethyl (B): <5 mg/dL (ref <5)

## 2015-10-23 MED ORDER — LAMOTRIGINE 25 MG PO TABS
25.0000 mg | ORAL_TABLET | Freq: Every day | ORAL | Status: DC
  Administered 2015-10-24: 25 mg via ORAL
  Filled 2015-10-23 (×2): qty 1

## 2015-10-23 MED ORDER — TRAZODONE HCL 100 MG PO TABS
100.0000 mg | ORAL_TABLET | Freq: Every evening | ORAL | Status: DC | PRN
  Administered 2015-10-23: 100 mg via ORAL
  Filled 2015-10-23: qty 1

## 2015-10-23 MED ORDER — PAROXETINE HCL 20 MG PO TABS
40.0000 mg | ORAL_TABLET | Freq: Every day | ORAL | Status: DC
  Administered 2015-10-23 – 2015-10-24 (×2): 40 mg via ORAL
  Filled 2015-10-23 (×2): qty 2

## 2015-10-23 MED ORDER — HYDROCHLOROTHIAZIDE 25 MG PO TABS
25.0000 mg | ORAL_TABLET | Freq: Every day | ORAL | Status: DC
  Filled 2015-10-23 (×3): qty 1

## 2015-10-23 MED ORDER — AMLODIPINE BESYLATE 5 MG PO TABS
10.0000 mg | ORAL_TABLET | Freq: Every day | ORAL | Status: DC
  Administered 2015-10-24: 10 mg via ORAL
  Filled 2015-10-23 (×2): qty 2

## 2015-10-23 MED ORDER — MELOXICAM 7.5 MG PO TABS
15.0000 mg | ORAL_TABLET | Freq: Every day | ORAL | Status: DC
  Administered 2015-10-24: 15 mg via ORAL
  Filled 2015-10-23 (×2): qty 2

## 2015-10-23 NOTE — ED Notes (Signed)
Patient resting quietly in room. Patient took all scheduled medications without problem.  No noted distress or abnormal behaviors noted. Will continue 15 minute checks and observation by security camera for safety.

## 2015-10-23 NOTE — BH Assessment (Signed)
Assessment Note  Nicole Cervantes is an 40 y.o. female who presents to the ER after being seen in the office of her PCP. While at her appointment, she voiced SI with the thoughts of overdosing on medications. Patient states, she has dealt with depression throughout her life. She does well for a week or two and then for months she is down, sad, have feelings of helplessness and hopelessness. On last night (10/22/2015) and today (10/23/2015) she was having SI and was unable to "come out of it."  Patient reports of having two suicide attempts. Once when she was a teenager and the other one in 2013. As a teenager she tried to cut her wrist but her mother found her and stop her. In 2013, she took an overdose of medications.  Patient is currently living with her husband and their teenage son. She reports of having no problems in the home. She currently receives mental health outpatient treatment with [facility].  Patient denies HI and AV/H. She also denies having a history of violence and aggression. She have no involvement with the legal system nor with DSS. During the interview, she was pleasant, calm and cooperative. Diagnosis: Depression  Past Medical History:  Past Medical History:  Diagnosis Date  . Back pain   . Bipolar 1 disorder ([place])   . Hypertension     Past Surgical History:  Procedure Laterality Date  . ABDOMINAL HYSTERECTOMY  2009  . OVARIAN CYST REMOVAL    . SKIN BIOPSY    . TONSILLECTOMY      Family History:  Family History  Problem Relation Age of Onset  . Diabetes Mother   . Bladder Cancer Father   . Prostate cancer Father   . Diabetes Father     Social History:  reports that she has never smoked. She has never used smokeless tobacco. She reports that she does not drink alcohol. Her drug history is not on file.  Additional Social History:  Alcohol / Drug Use Pain Medications: See PTA Prescriptions: See PTA Over the Counter: See PTA History of alcohol /  drug use?: No history of alcohol / drug abuse Longest period of sobriety (when/how long): No past or present use Negative Consequences of Use:  (Reports of none) Withdrawal Symptoms:  (Reports of none)  CIWA: CIWA-Ar BP: 121/82 Pulse Rate: 66 COWS:    Allergies: No Known Allergies  Home Medications:  (Not in a hospital admission)  OB/GYN Status:  No LMP recorded. Patient has had a hysterectomy.  General Assessment Data Location of Assessment: [facility] ED TTS Assessment: In system Is this a Tele or Face-to-Face Assessment?: Face-to-Face Is this an Initial Assessment or a Re-assessment for this encounter?: Initial Assessment Marital status: Married [place] name: n/a Is patient pregnant?: No Pregnancy Status: No Living Arrangements: Spouse/significant other, Children Can pt return to current living arrangement?: Yes Admission Status: Involuntary Is patient capable of signing voluntary admission?: No (Under IVC) Referral Source: Self/Family/Friend Insurance type: Insurance risk surveyor Exam ([place]) Medical Exam completed: Yes  Crisis Care Plan Living Arrangements: Spouse/significant other, Children Legal Guardian: Other: (Reports of none) Name of Psychiatrist: [place] Name of Therapist: [place]  Education Status Is patient currently in school?: No Current Grade: n/a Highest grade of school patient has completed: [employer] Diploma Name of school: n/a Contact person: n/a  Risk to self with the past 6 months Suicidal Ideation: Yes-Currently Present Has patient been a risk to self within the  past 6 months prior to admission? : Yes Suicidal Intent: Yes-Currently Present Has patient had any suicidal intent within the past 6 months prior to admission? : Yes Is patient at risk for suicide?: Yes Suicidal Plan?: Yes-Currently Present Has patient had any suicidal plan within the past 6 months prior to admission? : Yes Specify  Current Suicidal Plan: Overdose on medications Access to Means: Yes Specify Access to Suicidal Means: Overdose on medications What has been your use of drugs/alcohol within the last 12 months?: Reports of none Previous Attempts/Gestures: Yes How many times?: 2 Other Self Harm Risks: Reports of none Triggers for Past Attempts: Family contact, Other (Comment) (Stress) Intentional Self Injurious Behavior: None Family Suicide History: No Recent stressful life event(s): Other (Comment) (History of depression) Persecutory voices/beliefs?: No Depression: Yes Depression Symptoms: Tearfulness, Insomnia, Loss of interest in usual pleasures, Isolating, Fatigue Substance abuse history and/or treatment for substance abuse?: No Suicide prevention information given to non-admitted patients: Not applicable  Risk to Others within the past 6 months Homicidal Ideation: No Does patient have any lifetime risk of violence toward others beyond the six months prior to admission? : No Thoughts of Harm to Others: No Current Homicidal Intent: No Current Homicidal Plan: No Access to Homicidal Means: No Identified Victim: Reports of none History of harm to others?: No Violent Behavior Description: Reports of none Does patient have access to weapons?: No Criminal Charges Pending?: No Does patient have a court date: No Is patient on probation?: No  Psychosis Hallucinations: None noted Delusions: None noted  Mental Status Report Appearance/Hygiene: In scrubs, Unremarkable Eye Contact: Good Motor Activity: Freedom of movement, Unremarkable Speech: Logical/coherent, Soft, Unremarkable Level of Consciousness: Alert Mood: Depressed, Sad, Pleasant Affect: Appropriate to circumstance, Depressed, Sad Anxiety Level: Minimal Thought Processes: Coherent, Relevant Judgement: Unimpaired Orientation: Person, Place, Appropriate for developmental age, Situation, Time Obsessive Compulsive Thoughts/Behaviors:  None  Cognitive Functioning Concentration: Normal Memory: Remote Intact, Recent Intact IQ: Average Insight: Fair Impulse Control: Fair Appetite: Good Weight Loss: 0 Weight Gain: 10 (Last 30 days) Sleep: Decreased Total Hours of Sleep: 4 (Trouble falling and staying asleep) Vegetative Symptoms: None  ADLScreening [facility] Assessment Services) Patient's cognitive ability adequate to safely complete daily activities?: Yes Patient able to express need for assistance with ADLs?: Yes Independently performs ADLs?: Yes (appropriate for developmental age)  Prior Inpatient Therapy Prior Inpatient Therapy: Yes Prior Therapy Dates: 2013 Prior Therapy Facilty/Provider(s): [facility] Reason for Treatment: Depression, suicide attempt  Prior Outpatient Therapy Prior Outpatient Therapy: Yes Prior Therapy Dates: Current Prior Therapy Facilty/Provider(s): [facility] Reason for Treatment: Mood Disorder Does patient have an ACCT team?: No Does patient have Intensive In-House Services?  : No Does patient have [facility] services? : No Does patient have P4CC services?: No  ADL Screening (condition at time of admission) Patient's cognitive ability adequate to safely complete daily activities?: Yes Is the patient deaf or have difficulty hearing?: No Does the patient have difficulty seeing, even when wearing glasses/contacts?: No Does the patient have difficulty concentrating, remembering, or making decisions?: No Patient able to express need for assistance with ADLs?: Yes Does the patient have difficulty dressing or bathing?: No Independently performs ADLs?: Yes (appropriate for developmental age) Does the patient have difficulty walking or climbing stairs?: No Weakness of Legs: None Weakness of Arms/Hands: None  Home Assistive Devices/Equipment Home Assistive Devices/Equipment: None  Therapy Consults (therapy consults require a physician order) PT Evaluation Needed: No OT  Evalulation Needed: No SLP Evaluation Needed: No Abuse/Neglect Assessment (Assessment  to be complete while patient is alone) Physical Abuse: Denies Verbal Abuse: Denies Sexual Abuse: Denies Exploitation of patient/patient's resources: Denies Self-Neglect: Denies Values / Beliefs Cultural Requests During Hospitalization: None Spiritual Requests During Hospitalization: None Consults Spiritual Care Consult Needed: No Social Work Consult Needed: No Regulatory affairs officer (For Healthcare) Does patient have an advance directive?: No    Additional Information 1:1 In Past 12 Months?: No CIRT Risk: No Elopement Risk: No Does patient have medical clearance?: Yes  Child/Adolescent Assessment Running Away Risk: Denies (Patient is an adult)  Disposition:  Disposition Initial Assessment Completed for this Encounter: Yes Disposition of Patient: Other dispositions (ER MD Ordered Psych Consult)  On Site Evaluation by:   Reviewed with Physician:    Gunnar Fusi MS, LCAS, LPC, [place], CCSI Therapeutic Triage Specialist 10/23/2015 6:35 PM

## 2015-10-23 NOTE — ED Notes (Signed)
ENVIRONMENTAL ASSESSMENT Potentially harmful objects out of patient reach: Yes Personal belongings secured: Yes Patient dressed in hospital provided attire only: Yes Plastic bags out of patient reach: Yes Patient care equipment (cords, cables, call bells, lines, and drains) shortened, removed, or accounted for: Yes Equipment and supplies removed from bottom of stretcher: Yes Potentially toxic materials out of patient reach: Yes Sharps container removed or out of patient reach: Yes  Patient currently in room resting. No signs of acute distress noted at this time. Maintained on 15 minute checks and observation by security camera for safety.

## 2015-10-23 NOTE — ED Notes (Signed)
Pt. Alert and oriented, warm and dry, in no distress. Pt. Denies SI, HI, and AVH. Pt. Encouraged to let nursing staff know of any concerns or needs. 

## 2015-10-23 NOTE — BH Assessment (Signed)
Patient is to be admitted to [facility] by Dr. Weber Cooks.  Attending Physician will be Dr. Weber Cooks.   Patient has been assigned to room 305-A, by [facility] Charge Nurse Laureles   Intake Paper Work has been signed and placed on patient chart.  ER staff is aware of the admission Lattie Haw, ER Sect.; Dr. Alfred Levins, ER MD; Sharlee Blew, [place], Patient Access).

## 2015-10-23 NOTE — ED Provider Notes (Addendum)
[facility] Emergency Department Provider Note  ____________________________________________   I have reviewed the triage vital signs and the nursing notes.   HISTORY  Chief Complaint Suicidal    HPI Nicole Cervantes is a 40 y.o. female presents today complaining of wanting to kill herself. Has a history remotely of suicidal attempts. She is here with IVC paperwork. Patient states that she has not done anything recently that he would hurt herself. She has a history of medication overdose and that is what she is thinking about doing. She denies any particular stressors and states now is abusing her.  Past Medical History:  Diagnosis Date  . Back pain   . Bipolar 1 disorder ([place])   . Hypertension     There are no active problems to display for this patient.   Past Surgical History:  Procedure Laterality Date  . ABDOMINAL HYSTERECTOMY  2009  . OVARIAN CYST REMOVAL    . SKIN BIOPSY    . TONSILLECTOMY      Prior to Admission medications   Medication Sig Start Date End Date Taking? Authorizing Provider  amLODipine (NORVASC) 10 MG tablet Take 10 mg by mouth daily.    Historical Provider, MD  hydrochlorothiazide (HYDRODIURIL) 25 MG tablet Take 25 mg by mouth daily.    Historical Provider, MD  meloxicam (MOBIC) 15 MG tablet Take 1 tablet (15 mg total) by mouth daily. 01/06/15   Victorino Dike, FNP  methocarbamol (ROBAXIN) 500 MG tablet Take 1 tablet (500 mg total) by mouth 4 (four) times daily. 09/21/14   Melynda Ripple, MD  PARoxetine (PAXIL) 40 MG tablet Take 40 mg by mouth every morning.    Historical Provider, MD  predniSONE (DELTASONE) 20 MG tablet Take 3 tabs po on first day, 2 tabs second day, 2 tabs third day, 1 tab fourth day, 1 tab 5th day. Take with food. 09/21/14   Melynda Ripple, MD    Allergies Review of patient's allergies indicates no known allergies.  Family History  Problem Relation Age of Onset  . Diabetes Mother   . Bladder Cancer  Father   . Prostate cancer Father   . Diabetes Father     Social History Social History  Substance Use Topics  . Smoking status: Never Smoker  . Smokeless tobacco: Never Used  . Alcohol use No    Review of Systems }Constitutional: No fever/chills Eyes: No visual changes. ENT: No sore throat. No stiff neck no neck pain Cardiovascular: Denies chest pain. Respiratory: Denies shortness of breath. Gastrointestinal:   no vomiting.  No diarrhea.  No constipation. Genitourinary: Negative for dysuria. Musculoskeletal: Negative lower extremity swelling Skin: Negative for rash. Neurological: Negative for severe headaches, focal weakness or numbness. 10-point ROS otherwise negative.  ____________________________________________   PHYSICAL EXAM:  VITAL SIGNS: ED Triage Vitals  Enc Vitals Group     BP 10/23/15 1152 121/82     Pulse Rate 10/23/15 1152 66     Resp 10/23/15 1152 14     Temp 10/23/15 1152 97.5 F (36.4 C)     Temp Source 10/23/15 1152 Oral     SpO2 10/23/15 1152 97 %     Weight 10/23/15 1153 241 lb (109.3 kg)     Height 10/23/15 1153 5\' 4"  (1.626 m)     Head Circumference --      Peak Flow --      Pain Score 10/23/15 1153 6     Pain Loc --  Pain Edu? --      Excl. in [place]? --     Constitutional: Alert and oriented. Well appearing and in no acute distress. Eyes: Conjunctivae are normal. PERRL. EOMI. Head: Atraumatic. Nose: No congestion/rhinnorhea. Mouth/Throat: Mucous membranes are moist.  Oropharynx non-erythematous. Neck: No stridor.   Nontender with no meningismus Cardiovascular: Normal rate, regular rhythm. Grossly normal heart sounds.  Good peripheral circulation. Respiratory: Normal respiratory effort.  No retractions. Lungs CTAB. Abdominal: Soft and nontender. No distention. No guarding no rebound Back:  There is no focal tenderness or step off.  there is no midline tenderness there are no lesions noted. there is no CVA tenderness Musculoskeletal:  No lower extremity tenderness, no upper extremity tenderness. No joint effusions, no DVT signs strong distal pulses no edema Neurologic:  Normal speech and language. No gross focal neurologic deficits are appreciated.  Skin:  Skin is warm, dry and intact. No rash noted. Psychiatric: Mood and affect are somewhat flat. Speech and behavior are normal.  ____________________________________________   LABS (all labs ordered are listed, but only abnormal results are displayed)  Labs Reviewed  COMPREHENSIVE METABOLIC PANEL  ETHANOL  SALICYLATE LEVEL  ACETAMINOPHEN LEVEL  CBC  URINE DRUG SCREEN, QUALITATIVE ([place])   ____________________________________________  EKG  I personally interpreted any EKGs ordered by me or triage  ____________________________________________  RADIOLOGY  I reviewed any imaging ordered by me or triage that were performed during my shift and, if possible, patient and/or family made aware of any abnormal findings. ____________________________________________   PROCEDURES  Procedure(s) performed: None  Procedures  Critical Care performed: None  ____________________________________________   INITIAL IMPRESSION / ASSESSMENT AND PLAN / ED COURSE  Pertinent labs & imaging results that were available during my care of the patient were reviewed by me and considered in my medical decision making (see chart for details).  Patient presents today complaining of feeling depressed and wanting to kill herself. She is here With IVC paperwork we'll have her seen by psychiatry.  Clinical Course   ____________________________________________   FINAL CLINICAL IMPRESSION(S) / ED DIAGNOSES  Final diagnoses:  None      This chart was dictated using voice recognition software.  Despite best efforts to proofread,  errors can occur which can change meaning.      Schuyler Amor, MD 10/23/15 [place], MD 10/23/15 1350

## 2015-10-23 NOTE — ED Notes (Signed)

## 2015-10-23 NOTE — ED Triage Notes (Signed)
Pt states that she woke up this morning feeling very depressed and "wanting to take pills to take my life."  Pt states that she has a hx of bipolar. Admits that she has had hallucinations.  Pt went to [facility] and told them that she was wanting to harm herself so she was told that she could not leave. [facility] brought patient to hospital from clinic.

## 2015-10-23 NOTE — ED Notes (Signed)

## 2015-10-23 NOTE — ED Notes (Signed)
PT IVC/ PENDING PLACEMENT  

## 2015-10-23 NOTE — ED Notes (Signed)
Patient resting quietly in room. No noted distress or abnormal behaviors noted. Will continue 15 minute checks and observation by security camera for safety. 

## 2015-10-23 NOTE — ED Notes (Signed)
All jewelry, cell phone given to husband.

## 2015-10-23 NOTE — ED Notes (Signed)
Patient was admitted to the ED after going to a primary care appointment and stating that she had thoughts about harming herself. Patient says that she has been feeling depressed recently and thinking negative thoughts. Currently endorses passive SI, denies HI and AVH and pain, contracts for safety while in the hospital. Patient states that she wouldn't mind going inpatient for a couple of days in order to get stabilized. Patient is calm, cooperative and pleasant. No signs of distress noted. Maintained on 15 minute checks and observation by security camera for safety.

## 2015-10-23 NOTE — ED Notes (Signed)
Snack and drink given to pt. No co's voiced to this RN at this time.

## 2015-10-23 NOTE — Consult Note (Signed)
[place] [facility] Consult   Reason for Consult:  Consult for 40 year old woman with history of chronic mood disorder. Came to the hospital because of recent worsening of suicidal ideation. Referring Physician:  Burlene Arnt Patient Identification: Nicole Cervantes MRN:  [id number] Principal Diagnosis: Severe recurrent major depression without psychotic features [facility]) Diagnosis:   Patient Active Problem List   Diagnosis Date Noted  . Severe recurrent major depression without psychotic features ([place]) [F33.2] 10/23/2015  . Hypertension [I10] 10/23/2015  . Back pain [M54.9] 10/23/2015    Total Time spent with patient: 1 hour  Subjective:   Nicole Cervantes is a 40 y.o. female patient admitted with "I just got very depressed".  HPI:  Patient interviewed. Chart reviewed. Labs reviewed. 40 year old woman with a history of chronic mood disorder. She says over the past 2 days her mood has gotten much worse. Last night she found herself having thoughts of specifically overdosing on all of her medicine. She does not know of a new stressor that would make her mood worse. Nevertheless she has been feeling more down and sad. Not sleeping very well but that has been going on for months. Feeling hopeless. She does mention that she had a recent medication change. Her outpatient psychiatrist discontinued her Paxil and started her on Lamictal at only 25 mg. Patient denies alcohol abuse. Denies drug abuse.  Social history: Lives with her husband and her husband's 59 year old son. Patient describes her relationship with her husband as excellent. She is not aware of any acute stress.  Medical history: Overweight. High blood pressure. Chronic back pain.  Substance abuse history: Patient denies use of alcohol or recreational drugs and denies any past history of substance abuse.  Past Psychiatric History: Long-standing history of mood disorder. Has been diagnosed as having bipolar disorder although she says she has  never had a manic episode. This appears to be based more on chronic depression with fluctuation area patient was on Paxil for years which she thought was relatively effective. She has had 2 previous suicide attempts one as an adolescent and one in 2013. She was last hospitalized in about 2013. Other than Paxil and her current Lamictal she has been on Seroquel but developed a rash related to it. No history of violence.  Risk to Self: Is patient at risk for suicide?: Yes Risk to Others:   Prior Inpatient Therapy:   Prior Outpatient Therapy:    Past Medical History:  Past Medical History:  Diagnosis Date  . Back pain   . Bipolar 1 disorder ([place])   . Hypertension     Past Surgical History:  Procedure Laterality Date  . ABDOMINAL HYSTERECTOMY  2009  . OVARIAN CYST REMOVAL    . SKIN BIOPSY    . TONSILLECTOMY     Family History:  Family History  Problem Relation Age of Onset  . Diabetes Mother   . Bladder Cancer Father   . Prostate cancer Father   . Diabetes Father    Family Psychiatric  History: Extensive problems with depression and behavior problems on her mother's side of the family including depression in her mother Social History:  History  Alcohol Use No     History  Drug use: Unknown    Social History   Social History  . Marital status: Married    Spouse name: N/A  . Number of children: N/A  . Years of education: N/A   Social History Main Topics  . Smoking status: Never Smoker  . Smokeless tobacco:  Never Used  . Alcohol use No  . Drug use: Unknown  . Sexual activity: Not Asked   Other Topics Concern  . None   Social History Narrative  . None   Additional Social History:    Allergies:  No Known Allergies  Labs: No results found for this or any previous visit (from the past 48 hour(s)).  Current Facility-Administered Medications  Medication Dose Route Frequency Provider Last Rate Last Dose  . amLODipine (NORVASC) tablet 10 mg  10 mg Oral Daily Gonzella Lex, MD      . hydrochlorothiazide (HYDRODIURIL) tablet 25 mg  25 mg Oral Daily Gonzella Lex, MD      . lamoTRIgine (LAMICTAL) tablet 25 mg  25 mg Oral Daily Zayquan Bogard T Yula Crotwell, MD      . meloxicam (MOBIC) tablet 15 mg  15 mg Oral Daily Electra Paladino T Cameryn Schum, MD      . PARoxetine (PAXIL) tablet 40 mg  40 mg Oral Daily Gonzella Lex, MD      . traZODone (DESYREL) tablet 100 mg  100 mg Oral QHS PRN Gonzella Lex, MD       Current Outpatient Prescriptions  Medication Sig Dispense Refill  . amLODipine (NORVASC) 10 MG tablet Take 10 mg by mouth daily.    . hydrochlorothiazide (HYDRODIURIL) 25 MG tablet Take 25 mg by mouth daily.    . meloxicam (MOBIC) 15 MG tablet Take 1 tablet (15 mg total) by mouth daily. 30 tablet 0  . methocarbamol (ROBAXIN) 500 MG tablet Take 1 tablet (500 mg total) by mouth 4 (four) times daily. 40 tablet 0  . PARoxetine (PAXIL) 40 MG tablet Take 40 mg by mouth every morning.    . predniSONE (DELTASONE) 20 MG tablet Take 3 tabs po on first day, 2 tabs second day, 2 tabs third day, 1 tab fourth day, 1 tab 5th day. Take with food. 9 tablet 0    Musculoskeletal: Strength & Muscle Tone: within normal limits Gait & Station: normal Patient leans: N/A  Psychiatric Specialty Exam: Physical Exam  Nursing note and vitals reviewed. Constitutional: She appears well-developed and well-nourished.  HENT:  Head: Normocephalic and atraumatic.  Eyes: Conjunctivae are normal. Pupils are equal, round, and reactive to light.  Neck: Normal range of motion.  Cardiovascular: Regular rhythm and normal heart sounds.   Respiratory: Effort normal. No respiratory distress.  GI: Soft.  Musculoskeletal: Normal range of motion.  Neurological: She is alert.  Skin: Skin is warm and dry.  Psychiatric: Her speech is normal. Judgment normal. Her mood appears anxious. Her affect is not angry. She is slowed. Thought content is not paranoid. Cognition and memory are normal. She exhibits a depressed mood.  She expresses suicidal ideation. She expresses suicidal plans.    Review of Systems  Constitutional: Negative.   HENT: Negative.   Eyes: Negative.   Respiratory: Negative.   Cardiovascular: Negative.   Gastrointestinal: Negative.   Musculoskeletal: Negative.   Skin: Negative.   Neurological: Negative.   Psychiatric/Behavioral: Positive for depression and suicidal ideas. Negative for hallucinations, memory loss and substance abuse. The patient is nervous/anxious and has insomnia.     Blood pressure 121/82, pulse 66, temperature 97.5 F (36.4 C), temperature source Oral, resp. rate 14, height 5\' 4"  (1.626 m), weight 109.3 kg (241 lb), SpO2 97 %.Body mass index is 41.37 kg/m.  General Appearance: Casual  Eye Contact:  Good  Speech:  Clear and Coherent  Volume:  Normal  Mood:  Depressed  Affect:  Congruent  Thought Process:  Goal Directed  Orientation:  Full (Time, Place, and Person)  Thought Content:  Logical  Suicidal Thoughts:  Yes.  with intent/plan  Homicidal Thoughts:  No  Memory:  Immediate;   Good Recent;   Good Remote;   Good  Judgement:  Good  Insight:  Good  Psychomotor Activity:  Normal  Concentration:  Concentration: Fair  Recall:  [place] of Knowledge:  Fair  Language:  Fair  Akathisia:  No  Handed:  Right  AIMS (if indicated):     Assets:  Communication Skills Desire for Improvement Financial Resources/Insurance Housing Intimacy Resilience Social Support  ADL's:  Intact  Cognition:  WNL  Sleep:        Treatment Plan Summary: Daily contact with patient to assess and evaluate symptoms and progress in treatment, Medication management and Plan Patient is a 40 year old woman with chronic mood disorder. Diagnosis of bipolar disorder although to me it sounds more typical of recurrent severe depression. Current worsening of mood with suicidal ideation. Although she is not currently having psychotic symptoms she reports that she has had a past history of  visual hallucinations. Patient feels safer admitting herself to the hospital which sounds appropriate given her history. Admission orders completed. I recommend restarting antidepressant with Paxil 40 mg a day. Continue the Lamictal 25 mg a day. Continue her blood pressure medicine and move back. I'm going to check her TSH and lipid panel. 15 minute checks. Regular treatment on the psychiatric ward.  Disposition: Recommend psychiatric Inpatient admission when medically cleared. Supportive therapy provided about ongoing stressors.  Alethia Berthold, MD 10/23/2015 4:50 PM

## 2015-10-24 ENCOUNTER — Inpatient Hospital Stay
Admit: 2015-10-24 | Discharge: 2015-10-26 | DRG: 885 | Disposition: A | Discharge status: Home / Self Care | Payer: BLUE CROSS/BLUE SHIELD | Attending: Psychiatry | Admitting: Psychiatry

## 2015-10-24 DIAGNOSIS — Z8052 Family history of malignant neoplasm of bladder: Secondary | ICD-10-CM | POA: Diagnosis not present

## 2015-10-24 DIAGNOSIS — Z79899 Other long term (current) drug therapy: Secondary | ICD-10-CM | POA: Diagnosis not present

## 2015-10-24 DIAGNOSIS — Z818 Family history of other mental and behavioral disorders: Secondary | ICD-10-CM

## 2015-10-24 DIAGNOSIS — Z791 Long term (current) use of non-steroidal anti-inflammatories (NSAID): Secondary | ICD-10-CM | POA: Diagnosis not present

## 2015-10-24 DIAGNOSIS — M1990 Unspecified osteoarthritis, unspecified site: Secondary | ICD-10-CM | POA: Diagnosis present

## 2015-10-24 DIAGNOSIS — Z833 Family history of diabetes mellitus: Secondary | ICD-10-CM | POA: Diagnosis not present

## 2015-10-24 DIAGNOSIS — I10 Essential (primary) hypertension: Secondary | ICD-10-CM | POA: Diagnosis not present

## 2015-10-24 DIAGNOSIS — G4700 Insomnia, unspecified: Secondary | ICD-10-CM | POA: Diagnosis not present

## 2015-10-24 DIAGNOSIS — F332 Major depressive disorder, recurrent severe without psychotic features: Secondary | ICD-10-CM | POA: Diagnosis not present

## 2015-10-24 DIAGNOSIS — R45851 Suicidal ideations: Secondary | ICD-10-CM

## 2015-10-24 DIAGNOSIS — Z915 Personal history of self-harm: Secondary | ICD-10-CM

## 2015-10-24 DIAGNOSIS — Z9889 Other specified postprocedural states: Secondary | ICD-10-CM

## 2015-10-24 DIAGNOSIS — F329 Major depressive disorder, single episode, unspecified: Secondary | ICD-10-CM | POA: Diagnosis not present

## 2015-10-24 DIAGNOSIS — Z90710 Acquired absence of both cervix and uterus: Secondary | ICD-10-CM

## 2015-10-24 DIAGNOSIS — M549 Dorsalgia, unspecified: Secondary | ICD-10-CM | POA: Diagnosis present

## 2015-10-24 MED ORDER — AMLODIPINE BESYLATE 5 MG PO TABS
10.0000 mg | ORAL_TABLET | Freq: Every day | ORAL | Status: DC
  Administered 2015-10-25 – 2015-10-26 (×2): 10 mg via ORAL
  Filled 2015-10-24 (×2): qty 2

## 2015-10-24 MED ORDER — ACETAMINOPHEN 325 MG PO TABS: 650.0000 mg | ORAL_TABLET | Freq: Four times a day (QID) | ORAL | Status: DC | PRN

## 2015-10-24 MED ORDER — TRAZODONE HCL 100 MG PO TABS: 100.0000 mg | ORAL_TABLET | Freq: Every evening | ORAL | Status: DC | PRN

## 2015-10-24 MED ORDER — MELOXICAM 7.5 MG PO TABS
15.0000 mg | ORAL_TABLET | Freq: Every day | ORAL | Status: DC
  Administered 2015-10-25 – 2015-10-26 (×2): 15 mg via ORAL
  Filled 2015-10-24: qty 1
  Filled 2015-10-24 (×2): qty 2
  Filled 2015-10-24: qty 1

## 2015-10-24 MED ORDER — ALUM & MAG HYDROXIDE-SIMETH 200-200-20 MG/5ML PO SUSP: 30.0000 mL | ORAL | Status: DC | PRN

## 2015-10-24 MED ORDER — PAROXETINE HCL 20 MG PO TABS
40.0000 mg | ORAL_TABLET | Freq: Every day | ORAL | Status: DC
  Administered 2015-10-25 – 2015-10-26 (×2): 40 mg via ORAL
  Filled 2015-10-24 (×2): qty 2

## 2015-10-24 MED ORDER — LAMOTRIGINE 25 MG PO TABS
25.0000 mg | ORAL_TABLET | Freq: Every day | ORAL | Status: DC
  Administered 2015-10-25 – 2015-10-26 (×2): 25 mg via ORAL
  Filled 2015-10-24 (×2): qty 1

## 2015-10-24 MED ORDER — INFLUENZA VAC SPLIT QUAD 0.5 ML IM SUSY
0.5000 mL | PREFILLED_SYRINGE | INTRAMUSCULAR | Status: AC
  Administered 2015-10-25: 0.5 mL via INTRAMUSCULAR
  Filled 2015-10-24: qty 0.5

## 2015-10-24 MED ORDER — HYDROCHLOROTHIAZIDE 25 MG PO TABS: 25.0000 mg | ORAL_TABLET | Freq: Every day | ORAL | Status: DC

## 2015-10-24 MED ORDER — MAGNESIUM HYDROXIDE 400 MG/5ML PO SUSP: 30.0000 mL | Freq: Every day | ORAL | Status: DC | PRN

## 2015-10-24 NOTE — [facility] Suicide Risk Assessment (Signed)
[facility] INPATIENT:  Family/Significant Other Suicide Prevention Education  Suicide Prevention Education:  Education Completed; husband,  Sharetha Santarelli ph#: [phone] has been identified by the patient as the family member/significant other with whom the patient will be residing, and identified as the person(s) who will aid the patient in the event of a mental health crisis (suicidal ideations/suicide attempt).  With written consent from the patient, the family member/significant other has been provided the following suicide prevention education, prior to the and/or following the discharge of the patient. Pt husband concern about recent onset of depressive symptoms and suicidal thoughts. CSW educated husband about bipolar disorder.  The suicide prevention education provided includes the following:  Suicide risk factors  Suicide prevention and interventions  National Suicide Hotline telephone number  [facility] assessment telephone number  [facility] Emergency Assistance Early and/or Residential Mobile Crisis Unit telephone number  Request made of family/significant other to:  Remove weapons (e.g., guns, rifles, knives), all items previously/currently identified as safety concern.    Remove drugs/medications (over-the-counter, prescriptions, illicit drugs), all items previously/currently identified as a safety concern.  The family member/significant other verbalizes understanding of the suicide prevention education information provided.  The family member/significant other agrees to remove the items of safety concern listed above.  Emilie Rutter, MSW, LCSW-A 10/24/2015, 4:18 PM

## 2015-10-24 NOTE — [facility] Group Notes (Signed)
[facility] Group Notes:  (Nursing/MHT/Case Management/Adjunct)  Date:  10/24/2015  Time:  4:13 PM  Type of Therapy:  Psychoeducational Skills  Participation Level:  Active  Participation Quality:  Appropriate, Attentive and Sharing  Affect:  Appropriate  Cognitive:  Alert and Appropriate  Insight:  Appropriate  Engagement in Group:  Engaged  Modes of Intervention:  Discussion, Education and Support  Summary of Progress/Problems:  Nicole Cervantes 10/24/2015, 4:13 PM

## 2015-10-24 NOTE — ED Notes (Signed)
Patient is alert and oriented, Patient is calm, denies si with a plan at this time, but states that she feels down and does not want to go home like this, because she is afraid of what she would do. Patient states " I had a great time with my husband last weekend and then of the way home I started to get agitated and depressed, states that He husband is great and supportive and she has great parents, had a great childhood, she states " I have no real reason to feel this way' patient is wanting to feel better and to get on the right medications and also states she wants to find a support group, nurse told her about [facility] program that is in [place]. Patient is safe, q 15 min. Checks and camera monitoring in progress.
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
## 2015-10-24 NOTE — ED Notes (Signed)
Patient is watching tv, she is safe, no signs of distress, denies si with at plan, no avh, q 15 min. Checks and camera monitoring in progress.

## 2015-10-24 NOTE — ED Notes (Signed)
Called report to [facility], patient is going to be transferred to room 305. Patient is sleeping at present time, no signs of distress. q15 minute checks and camera monitoring.

## 2015-10-24 NOTE — ED Notes (Signed)
Lunch served, no signs of distress, no behavioral issues. q 15 min. Checks, and camera monitoring.

## 2015-10-24 NOTE — Tx Team (Signed)
Initial Treatment Plan 10/24/2015 3:19 PM [name] [id number]    PATIENT STRESSORS: Marital or family conflict Medication change or noncompliance   PATIENT STRENGTHS: Ability for insight Average or above average intelligence Capable of independent living Occupational psychologist fund of knowledge Motivation for treatment/growth Physical Health Supportive family/friends Work skills   PATIENT IDENTIFIED PROBLEMS:   'I wanted to Od"-suicidal ideation     "I have been spiraling down since July"-mood instability               DISCHARGE CRITERIA:  Improved stabilization in mood, thinking, and/or behavior Motivation to continue treatment in a less acute level of care Reduction of life-threatening or endangering symptoms to within safe limits Verbal commitment to aftercare and medication compliance  PRELIMINARY DISCHARGE PLAN: Outpatient therapy Return to previous living arrangement Return to previous work or school arrangements  PATIENT/FAMILY INVOLVEMENT: This treatment plan has been presented to and reviewed with the patient, Nicole Cervantes.  The patient and family have been given the opportunity to ask questions and make suggestions.  Delfin Edis, RN 10/24/2015, 3:19 PM
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
## 2015-10-24 NOTE — H&P (Addendum)
Psychiatric Admission Assessment Adult  Patient Identification: Nicole Cervantes MRN:  [id number] Date of Evaluation:  10/24/2015 Chief Complaint:  Depression Principal Diagnosis: Severe episode of recurrent major depressive disorder, without psychotic features ([place]) Diagnosis:   Patient Active Problem List   Diagnosis Date Noted  . Severe episode of recurrent major depressive disorder, without psychotic features ([place]) [F33.2] 10/24/2015  . Severe recurrent major depression without psychotic features ([place]) [F33.2] 10/23/2015  . Hypertension [I10] 10/23/2015  . Back pain [M54.9] 10/23/2015   History of Present Illness: This is a 40 year old woman who presented to the emergency room with symptoms of worsening depression. Her mood had been getting worse with increased hopelessness and more suicidal ideation with thoughts of wrecking her car or otherwise trying to harm herself. Patient was aware that she was feeling worse and was voluntarily seeking treatment. She had had a relatively recent change in her medication in that her outpatient provider at discontinued her peroxide teen and started her on Lamictal. She was still at only the lowest dose of an upward taper of Lamictal. Patient had been given a diagnosis of bipolar disorder although she does not describe any clear history of mania or hypomania. Not abusing substances. Associated Signs/Symptoms: Depression Symptoms:  depressed mood, psychomotor retardation, fatigue, feelings of worthlessness/guilt, difficulty concentrating, hopelessness, suicidal thoughts with specific plan, (Hypo) Manic Symptoms:  None Anxiety Symptoms:  Excessive Worry, Psychotic Symptoms:  None PTSD Symptoms: Negative Total Time spent with patient: 45 minutes  Past Psychiatric History: Patient has a long-standing history of depression with a possible diagnosis of bipolar disorder. She does have a past history of suicide attempts. Patient had been stable for quite a  while but recently had a decompensation. No known clear-cut psychosis social stress. No history of psychosis or mania. Had been doing well on Paxil. Sees a therapist as well.  Is the patient at risk to self? Yes.    Has the patient been a risk to self in the past 6 months? Yes.    Has the patient been a risk to self within the distant past? Yes.    Is the patient a risk to others? No.  Has the patient been a risk to others in the past 6 months? No.  Has the patient been a risk to others within the distant past? No.   Prior Inpatient Therapy:   Prior Outpatient Therapy:    Alcohol Screening: 1. How often do you have a drink containing alcohol?: Never 3. How often do you have six or more drinks on one occasion?: Never 9. Have you or someone else been injured as a result of your drinking?: No 10. Has a relative or friend or a doctor or another health worker been concerned about your drinking or suggested you cut down?: No Alcohol Use Disorder Identification Test Final Score (AUDIT): 0 Brief Intervention: AUDIT score less than 7 or less-screening does not suggest unhealthy drinking-brief intervention not indicated Substance Abuse History in the last 12 months:  No. Consequences of Substance Abuse: Negative Previous Psychotropic Medications: Yes  Psychological Evaluations: Yes  Past Medical History:  Past Medical History:  Diagnosis Date  . Back pain   . Bipolar 1 disorder ([place])   . Hypertension     Past Surgical History:  Procedure Laterality Date  . ABDOMINAL HYSTERECTOMY  2009  . Cyst removal to lower back    . OVARIAN CYST REMOVAL    . SKIN BIOPSY    . TONSILLECTOMY     Family  History:  Family History  Problem Relation Age of Onset  . Diabetes Mother   . Bladder Cancer Father   . Prostate cancer Father   . Diabetes Father    Family Psychiatric  History: Positive for depression Tobacco Screening: Have you used any form of tobacco in the last 30 days? (Cigarettes,  Smokeless Tobacco, Cigars, and/or Pipes): No Social History:  History  Alcohol Use No     History  Drug Use No    Additional Social History: Marital status: Married Number of Years Married: 2 What types of issues is patient dealing with in the relationship?: None  What is your sexual orientation?: Straight  Has your sexual activity been affected by drugs, alcohol, medication, or emotional stress?: Emotional stress - pt states she never wants to have sex  Does patient have children?: Yes How many children?: 1 How is patient's relationship with their children?: 1 of pt's own, husband has 7 daughter     History of alcohol / drug use?: No history of alcohol / drug abuse                    Allergies:  No Known Allergies Lab Results:  Results for orders placed or performed during the hospital encounter of 10/23/15 (from the past 48 hour(s))  Urine Drug Screen, Qualitative     Status: None   Collection Time: 10/23/15 12:00 PM  Result Value Ref Range   Tricyclic, Ur Screen NONE DETECTED NONE DETECTED   Amphetamines, Ur Screen NONE DETECTED NONE DETECTED   MDMA (Ecstasy)Ur Screen NONE DETECTED NONE DETECTED   Cocaine Metabolite,Ur [place] NONE DETECTED NONE DETECTED   Opiate, Ur Screen NONE DETECTED NONE DETECTED   Phencyclidine (PCP) Ur S NONE DETECTED NONE DETECTED   Cannabinoid 50 Ng, Ur [place] NONE DETECTED NONE DETECTED   Barbiturates, Ur Screen NONE DETECTED NONE DETECTED   Benzodiazepine, Ur Scrn NONE DETECTED NONE DETECTED   Methadone Scn, Ur NONE DETECTED NONE DETECTED    Comment: (NOTE) 834  Tricyclics, urine               Cutoff 1000 ng/mL 200  Amphetamines, urine             Cutoff 1000 ng/mL 300  MDMA (Ecstasy), urine           Cutoff 500 ng/mL 400  Cocaine Metabolite, urine       Cutoff 300 ng/mL 500  Opiate, urine                   Cutoff 300 ng/mL 600  Phencyclidine (PCP), urine      Cutoff 25 ng/mL 700  Cannabinoid, urine              Cutoff 50 ng/mL 800   Barbiturates, urine             Cutoff 200 ng/mL 900  Benzodiazepine, urine           Cutoff 200 ng/mL 1000 Methadone, urine                Cutoff 300 ng/mL 1100 1200 The urine drug screen provides only a preliminary, unconfirmed 1300 analytical test result and should not be used for non-medical 1400 purposes. Clinical consideration and professional judgment should 1500 be applied to any positive drug screen result due to possible 1600 interfering substances. A more specific alternate chemical method 1700 must be used in order to obtain a confirmed analytical result.  1800 Gas chromato  graphy / mass spectrometry (GC/MS) is the preferred 1900 confirmatory method.   Comprehensive metabolic panel     Status: Abnormal   Collection Time: 10/23/15 12:15 PM  Result Value Ref Range   Sodium 140 135 - 145 mmol/L   Potassium 3.0 (L) 3.5 - 5.1 mmol/L   Chloride 103 101 - 111 mmol/L   CO2 30 22 - 32 mmol/L   Glucose, Bld 87 65 - 99 mg/dL   BUN 15 6 - 20 mg/dL   Creatinine, Ser 0.63 0.44 - 1.00 mg/dL   Calcium 9.1 8.9 - 10.3 mg/dL   Total Protein 7.4 6.5 - 8.1 g/dL   Albumin 3.8 3.5 - 5.0 g/dL   AST 29 15 - 41 U/L   ALT 30 14 - 54 U/L   Alkaline Phosphatase 64 38 - 126 U/L   Total Bilirubin 0.4 0.3 - 1.2 mg/dL   GFR calc non Af Amer >60 >60 mL/min   GFR calc Af Amer >60 >60 mL/min    Comment: (NOTE) The eGFR has been calculated using the CKD EPI equation. This calculation has not been validated in all clinical situations. eGFR's persistently <60 mL/min signify possible Chronic Kidney Disease.    Anion gap 7 5 - 15  Ethanol     Status: None   Collection Time: 10/23/15 12:15 PM  Result Value Ref Range   Alcohol, Ethyl (B) <5 <5 mg/dL    Comment:        LOWEST DETECTABLE LIMIT FOR SERUM ALCOHOL IS 5 mg/dL FOR MEDICAL PURPOSES ONLY   Salicylate level     Status: None   Collection Time: 10/23/15 12:15 PM  Result Value Ref Range   Salicylate Lvl <6.1 2.8 - 30.0 mg/dL  Acetaminophen  level     Status: Abnormal   Collection Time: 10/23/15 12:15 PM  Result Value Ref Range   Acetaminophen (Tylenol), Serum <10 (L) 10 - 30 ug/mL    Comment:        THERAPEUTIC CONCENTRATIONS VARY SIGNIFICANTLY. A RANGE OF 10-30 ug/mL MAY BE AN EFFECTIVE CONCENTRATION FOR MANY PATIENTS. HOWEVER, SOME ARE BEST TREATED AT CONCENTRATIONS OUTSIDE THIS RANGE. ACETAMINOPHEN CONCENTRATIONS >150 ug/mL AT 4 HOURS AFTER INGESTION AND >50 ug/mL AT 12 HOURS AFTER INGESTION ARE OFTEN ASSOCIATED WITH TOXIC REACTIONS.   cbc     Status: Abnormal   Collection Time: 10/23/15 12:15 PM  Result Value Ref Range   WBC 12.8 (H) 3.6 - 11.0 K/uL   RBC 4.49 3.80 - 5.20 MIL/uL   Hemoglobin 14.3 12.0 - 16.0 g/dL   HCT 40.7 35.0 - 47.0 %   MCV 90.6 80.0 - 100.0 fL   MCH 31.8 26.0 - 34.0 pg   MCHC 35.0 32.0 - 36.0 g/dL   RDW 13.2 11.5 - 14.5 %   Platelets 245 150 - 440 K/uL    Blood Alcohol level:  Lab Results  Component Value Date   ETH <5 68/37/2902    Metabolic Disorder Labs:  No results found for: HGBA1C, MPG No results found for: PROLACTIN No results found for: CHOL, TRIG, HDL, CHOLHDL, VLDL, LDLCALC  Current Medications: Current Facility-Administered Medications  Medication Dose Route Frequency Provider Last Rate Last Dose  . acetaminophen (TYLENOL) tablet 650 mg  650 mg Oral Q6H PRN Gonzella Lex, MD      . alum & mag hydroxide-simeth (MAALOX/MYLANTA) 200-200-20 MG/5ML suspension 30 mL  30 mL Oral Q4H PRN Gonzella Lex, MD      . Derrill Memo ON 10/25/2015] amLODipine (  NORVASC) tablet 10 mg  10 mg Oral Daily Gonzella Lex, MD      . Derrill Memo ON 10/25/2015] Influenza vac split quadrivalent PF (FLUARIX) injection 0.5 mL  0.5 mL Intramuscular Tomorrow-1000 Gonzella Lex, MD      . Derrill Memo ON 10/25/2015] lamoTRIgine (LAMICTAL) tablet 25 mg  25 mg Oral Daily Gonzella Lex, MD      . magnesium hydroxide (MILK OF MAGNESIA) suspension 30 mL  30 mL Oral Daily PRN Gonzella Lex, MD      . Derrill Memo ON  10/25/2015] meloxicam (MOBIC) tablet 15 mg  15 mg Oral Daily Gonzella Lex, MD      . Derrill Memo ON 10/25/2015] PARoxetine (PAXIL) tablet 40 mg  40 mg Oral Daily Gonzella Lex, MD      . traZODone (DESYREL) tablet 100 mg  100 mg Oral QHS PRN Gonzella Lex, MD       PTA Medications: Prescriptions Prior to Admission  Medication Sig Dispense Refill Last Dose  . amLODipine (NORVASC) 10 MG tablet Take 10 mg by mouth daily.   unknown at unknown  . hydrochlorothiazide (HYDRODIURIL) 25 MG tablet Take 25 mg by mouth daily.   unknown at unknown  . lamoTRIgine (LAMICTAL) 25 MG tablet Take 25 mg by mouth daily. Take 1 tablet daily for 2 weeks than 2 tablets daily for 2 weeks   unknown at unknown  . meloxicam (MOBIC) 15 MG tablet Take 1 tablet (15 mg total) by mouth daily. 30 tablet 0 unknown at unknown  . PARoxetine (PAXIL) 40 MG tablet Take 40 mg by mouth every morning.       Musculoskeletal: Strength & Muscle Tone: within normal limits Gait & Station: normal Patient leans: N/A  Psychiatric Specialty Exam: Physical Exam  Nursing note and vitals reviewed. Constitutional: She appears well-developed and well-nourished.  HENT:  Head: Normocephalic and atraumatic.  Eyes: Conjunctivae are normal. Pupils are equal, round, and reactive to light.  Neck: Normal range of motion.  Cardiovascular: Regular rhythm and normal heart sounds.   Respiratory: Effort normal. No respiratory distress.  GI: Soft.  Musculoskeletal: Normal range of motion.  Neurological: She is alert.  Skin: Skin is warm and dry.  Psychiatric: She has a normal mood and affect. Her behavior is normal. Judgment and thought content normal.    Review of Systems  Constitutional: Negative.   HENT: Negative.   Eyes: Negative.   Respiratory: Negative.   Cardiovascular: Negative.   Gastrointestinal: Negative.   Musculoskeletal: Negative.   Skin: Negative.   Neurological: Negative.   Psychiatric/Behavioral: Positive for depression.  Negative for hallucinations, memory loss, substance abuse and suicidal ideas. The patient is not nervous/anxious and does not have insomnia.     Blood pressure 117/89, pulse 82, temperature 97.7 F (36.5 C), temperature source Oral, resp. rate 18, height _0  (1.626 m), weight 108.9 kg (240 lb), SpO2 99 %.Body mass index is 41.2 kg/m.  General Appearance: Casual  Eye Contact:  Good  Speech:  Clear and Coherent  Volume:  Normal  Mood:  Depressed  Affect:  Appropriate  Thought Process:  Coherent  Orientation:  Full (Time, Place, and Person)  Thought Content:  Logical  Suicidal Thoughts:  No  Homicidal Thoughts:  No  Memory:  Immediate;   Good Recent;   Good Remote;   Good  Judgement:  Fair  Insight:  Good  Psychomotor Activity:  Normal  Concentration:  Concentration: Good  Recall:  Good  Fund of Knowledge:  Good  Language:  Good  Akathisia:  No  Handed:  Right  AIMS (if indicated):     Assets:  Communication Skills Desire for Improvement Financial Resources/Insurance Housing Physical Health Resilience Social Support  ADL's:  Intact  Cognition:  WNL  Sleep:       Treatment Plan Summary: Daily contact with patient to assess and evaluate symptoms and progress in treatment, Medication management and Plan Patient has been started back on her peroxide team while the lamotrigine has been continued. She is already reporting an improvement in symptoms. Feels more optimistic about the future. Denies any current active suicidal thoughts. Affect is looking a little bit brighter. Patient is motivated to cooperate with therapy. Treatment team can evaluate her and decide when she appears stable enough for discharge and then work on appropriate outpatient plan.  Observation Level/Precautions:  15 minute checks  Laboratory:  HbAIC  Psychotherapy:    Medications:    Consultations:    Discharge Concerns:    Estimated LOS:  Other:     Physician Treatment Plan for Primary Diagnosis:  Severe episode of recurrent major depressive disorder, without psychotic features ([place]) Long Term Goal(s): Improvement in symptoms so as ready for discharge  Short Term Goals: Ability to verbalize feelings will improve and Ability to disclose and discuss suicidal ideas  Physician Treatment Plan for Secondary Diagnosis: Principal Problem:   Severe episode of recurrent major depressive disorder, without psychotic features ([place])  Long Term Goal(s): Improvement in symptoms so as ready for discharge  Short Term Goals: Ability to verbalize feelings will improve, Ability to identify and develop effective coping behaviors will improve and Ability to maintain clinical measurements within normal limits will improve  I certify that inpatient services furnished can reasonably be expected to improve the patient's condition.    Alethia Berthold, MD 9/20/20176:01 PM

## 2015-10-25 ENCOUNTER — Encounter: Payer: Self-pay | Admitting: Psychiatry

## 2015-10-25 DIAGNOSIS — R45851 Suicidal ideations: Secondary | ICD-10-CM

## 2015-10-25 LAB — LIPID PANEL
CHOL/HDL RATIO: 3.6 ratio
CHOLESTEROL: 186 mg/dL (ref 0–200)
HDL: 52 mg/dL (ref >40)
LDL CALC: 96 mg/dL (ref 0–99)
TRIGLYCERIDES: 188 mg/dL — AB (ref <150)
VLDL: 38 mg/dL (ref 0–40)

## 2015-10-25 LAB — TSH: TSH: 4.845 u[IU]/mL — ABNORMAL HIGH (ref 0.350–4.500)

## 2015-10-25 NOTE — Progress Notes (Signed)
Recreation Therapy Notes  Date: 09.21.17 Time: 9:30 am Location: Craft Room  Group Topic: Coping Skills/Leisure Education  Goal Area(s) Addresses:  Patient will identify things they are grateful for.  Patient will identify how being grateful can influence their decision making.  Behavioral Response: Attentive, Interactive  Intervention: Grateful Wheel  Activity: Patients were given an I Am Grateful For worksheet and instructed to write things they are grateful for under each category.  Education: LRT educated group on why it is important to be grateful.  Education Outcome: Acknowledges education/In group clarification offered  Clinical Observations/Feedback: Patient completed activity by writing things she was grateful for. Patient contributed to group discussion by stating things she was grateful for, what category had more things she was grateful for in it, her common thread, and that she does not participate in leisure activities often and why.  Leonette Monarch, LRT/CTRS 10/25/2015 10:16 AM

## 2015-10-25 NOTE — Progress Notes (Signed)
   10/25/15 1100  Clinical Encounter Type  Visited With Patient  Visit Type Follow-up;Psychological support;Spiritual support  Referral From Nurse  Consult/Referral To Chaplain  Recommendations At some point counseling and group therapy may be beneficial.   Spiritual Encounters  Spiritual Needs Prayer;Emotional  Stress Factors  Patient Stress Factors Health changes;Loss of control  Family Stress Factors None identified  Advance Directives (For Healthcare)  Does patient have an advance directive? No  Would patient like information on creating an advanced directive? Yes - Spiritual care consult ordered   Patient was resting when Chaplain team entered the room. We were able to share the services offered and I told the patient that while performing rounds I would follow up again and see if there was anything she needs.

## 2015-10-25 NOTE — Tx Team (Signed)
Interdisciplinary Treatment and Diagnostic Plan Update  10/25/2015 Time of Session: 1045am Nicole Cervantes MRN: [id number]  Principal Diagnosis: Severe episode of recurrent major depressive disorder, without psychotic features ([place])  Secondary Diagnoses: Principal Problem:   Severe episode of recurrent major depressive disorder, without psychotic features ([place]) Active Problems:   Back pain   Suicidal ideation   Current Medications:  Current Facility-Administered Medications  Medication Dose Route Frequency Provider Last Rate Last Dose  . acetaminophen (TYLENOL) tablet 650 mg  650 mg Oral Q6H PRN Gonzella Lex, MD      . alum & mag hydroxide-simeth (MAALOX/MYLANTA) 200-200-20 MG/5ML suspension 30 mL  30 mL Oral Q4H PRN Gonzella Lex, MD      . amLODipine (NORVASC) tablet 10 mg  10 mg Oral Daily Gonzella Lex, MD   10 mg at 10/25/15 0820  . lamoTRIgine (LAMICTAL) tablet 25 mg  25 mg Oral Daily Gonzella Lex, MD   25 mg at 10/25/15 0820  . magnesium hydroxide (MILK OF MAGNESIA) suspension 30 mL  30 mL Oral Daily PRN Gonzella Lex, MD      . meloxicam (MOBIC) tablet 15 mg  15 mg Oral Daily Gonzella Lex, MD   15 mg at 10/25/15 0844  . PARoxetine (PAXIL) tablet 40 mg  40 mg Oral Daily Gonzella Lex, MD   40 mg at 10/25/15 0819  . traZODone (DESYREL) tablet 100 mg  100 mg Oral QHS PRN Gonzella Lex, MD       PTA Medications: Prescriptions Prior to Admission  Medication Sig Dispense Refill Last Dose  . amLODipine (NORVASC) 10 MG tablet Take 10 mg by mouth daily.   unknown at unknown  . hydrochlorothiazide (HYDRODIURIL) 25 MG tablet Take 25 mg by mouth daily.   unknown at unknown  . lamoTRIgine (LAMICTAL) 25 MG tablet Take 25 mg by mouth daily. Take 1 tablet daily for 2 weeks than 2 tablets daily for 2 weeks   unknown at unknown  . meloxicam (MOBIC) 15 MG tablet Take 1 tablet (15 mg total) by mouth daily. 30 tablet 0 unknown at unknown  . PARoxetine (PAXIL) 40 MG tablet Take 40 mg by  mouth every morning.       Treatment Modalities: Medication Management, Group therapy, Case management,  1 to 1 session with clinician, Psychoeducation, Recreational therapy.   Physician Treatment Plan for Primary Diagnosis: Severe episode of recurrent major depressive disorder, without psychotic features ([place]) Long Term Goal(s): Improvement in symptoms so as ready for discharge   Short Term Goals: Ability to verbalize feelings will improve, Ability to demonstrate self-control will improve, Ability to identify and develop effective coping behaviors will improve and Ability to maintain clinical measurements within normal limits will improve  Medication Management: Evaluate patient's response, side effects, and tolerance of medication regimen.  Therapeutic Interventions: 1 to 1 sessions, Unit Group sessions and Medication administration.  Evaluation of Outcomes: Progressing  Physician Treatment Plan for Secondary Diagnosis: Principal Problem:   Severe episode of recurrent major depressive disorder, without psychotic features ([place]) Active Problems:   Back pain   Suicidal ideation  Long Term Goal(s): Improvement in symptoms so as ready for discharge  Short Term Goals: Ability to verbalize feelings will improve, Ability to demonstrate self-control will improve, Ability to identify and develop effective coping behaviors will improve and Ability to maintain clinical measurements within normal limits will improve  Medication Management: Evaluate patient's response, side effects, and tolerance of medication regimen.  Therapeutic  Interventions: 1 to 1 sessions, Unit Group sessions and Medication administration.  Evaluation of Outcomes: Progressing   RN Treatment Plan for Primary Diagnosis: Severe episode of recurrent major depressive disorder, without psychotic features ([place]) Long Term Goal(s): Knowledge of disease and therapeutic regimen to maintain health will improve  Short Term Goals:  Ability to verbalize feelings will improve, Ability to disclose and discuss suicidal ideas, Ability to identify and develop effective coping behaviors will improve and Compliance with prescribed medications will improve  Medication Management: RN will administer medications as ordered by provider, will assess and evaluate patient's response and provide education to patient for prescribed medication. RN will report any adverse and/or side effects to prescribing provider.  Therapeutic Interventions: 1 on 1 counseling sessions, Psychoeducation, Medication administration, Evaluate responses to treatment, Monitor vital signs and CBGs as ordered, Perform/monitor CIWA, COWS, AIMS and Fall Risk screenings as ordered, Perform wound care treatments as ordered.  Evaluation of Outcomes: Progressing   LCSW Treatment Plan for Primary Diagnosis: Severe episode of recurrent major depressive disorder, without psychotic features ([place]) Long Term Goal(s): Safe transition to appropriate next level of care at discharge, Engage patient in therapeutic group addressing interpersonal concerns.  Short Term Goals: Engage patient in aftercare planning with referrals and resources, Increase ability to appropriately verbalize feelings, Increase emotional regulation, Identify triggers associated with mental health/substance abuse issues and Increase skills for wellness and recovery  Therapeutic Interventions: Assess for all discharge needs, 1 to 1 time with Social worker, Explore available resources and support systems, Assess for adequacy in community support network, Educate family and significant other(s) on suicide prevention, Complete Psychosocial Assessment, Interpersonal group therapy.  Evaluation of Outcomes: Progressing   Progress in Treatment: Attending groups: Yes. Participating in groups: Yes. Taking medication as prescribed: Yes. Toleration medication: Yes. Family/Significant other contact made: Yes,  individual(s) contacted:  husband Patient understands diagnosis: Yes. Discussing patient identified problems/goals with staff: Yes. Medical problems stabilized or resolved: Yes. Denies suicidal/homicidal ideation: No. Issues/concerns per patient self-inventory: No. Other: n/a  New problem(s) identified: None identified at this time.  New Short Term/Long Term Goal(s):None identified at this time.  Discharge Plan or Barriers: Patient will discharge back home and follow-up with [facility].  Reason for Continuation of Hospitalization: Depression Medication stabilization Suicidal ideation  Estimated Length of Stay: 3 to 5 days  Attendees: Patient:Nicole Cervantes 10/25/2015 2:31 PM  Physician: Dr. Orson Slick, MD 10/25/2015 2:31 PM  Nursing: Ritta Slot 10/25/2015 2:31 PM  RN Care Manager: 10/25/2015 2:31 PM  Social Worker: Lear Ng. Claybon Jabs MSW, [place] 10/25/2015 2:31 PM  Recreational Therapist: Leonette Monarch, LRT/CTRS 10/25/2015 2:31 PM  Other:  10/25/2015 2:31 PM  Other:  10/25/2015 2:31 PM  Other: 10/25/2015 2:31 PM    Scribe for Treatment Team: Jolaine Click, [place] 10/25/2015 2:38 PM

## 2015-10-25 NOTE — Progress Notes (Signed)
Recreation Therapy Notes  INPATIENT RECREATION THERAPY ASSESSMENT  Patient Details Name: Nicole Cervantes MRN: [id number] DOB: May 24, 1975 Today's Date: 10/25/2015  Patient Stressors: Family, Friends, Work, Other (Comment) (Son just moved to [place] to live with pt's parents - he doesn't want to talk to her and he is making bad decisions; lack of supportive friends; can't pass tests at work and doesn't know what will happen if she can't pass them; not sleeping)  Coping Skills:   Isolate, Avoidance, Art/Dance, Talking, Music, Sports, Other (Comment) (Breathe)  Personal Challenges: Concentration, Decision-Making, Relationships, Self-Esteem/Confidence, Social Interaction, Stress Management  Leisure Interests (2+):  Art - Paint, Individual - Other (Comment) (Read)  Awareness of Community Resources:  Yes  Community Resources:  Library  Current Use: No  If no, Barriers?: Other (Comment), Social (Anxiety)  Patient Strengths:  Being a Radio producer, 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

## 2015-10-25 NOTE — Progress Notes (Signed)
A&Ox3, interacting with peers in the room, c/o 4/10 pain, "I don't need pain medication because the Meloxicam 15 mg once a day is adequate for my pain right now", denied SI/HI, denied AV/H. "So far so good, I am looking for a short stay, glad to be back on Paxil.Marland KitchenMarland Kitchen"

## 2015-10-25 NOTE — Progress Notes (Signed)
[facility] MD Progress Note  10/25/2015 11:57 AM Nicole Cervantes  MRN:  [id number]  Subjective:  Nicole Cervantes reports slight improvement in her mood today. She still feels very anxious and has "popping" suicidal ideation. Her plan was to overdose on medications so she feels safe in the hospital. She was restarted on Paxil and seems to tolerate it well. There are no somatic complaints. Good group participation. She met with treatment team today. She is pleasant polite and cooperated. She easily engages in conversation. Following discharge, she will return to her home with her husband. She would like to switch her provider for [facility] to [facility].  Principal Problem: Severe episode of recurrent major depressive disorder, without psychotic features ([place]) Diagnosis:   Patient Active Problem List   Diagnosis Date Noted  . Suicidal ideation [R45.851] 10/25/2015  . Severe episode of recurrent major depressive disorder, without psychotic features ([place]) [F33.2] 10/24/2015  . Hypertension [I10] 10/23/2015  . Back pain [M54.9] 10/23/2015   Total Time spent with patient: 20 minutes  Past Psychiatric History: Depression.  Past Medical History:  Past Medical History:  Diagnosis Date  . Back pain   . Bipolar 1 disorder ([place])   . Hypertension     Past Surgical History:  Procedure Laterality Date  . ABDOMINAL HYSTERECTOMY  2009  . Cyst removal to lower back    . OVARIAN CYST REMOVAL    . SKIN BIOPSY    . TONSILLECTOMY     Family History:  Family History  Problem Relation Age of Onset  . Diabetes Mother   . Bladder Cancer Father   . Prostate cancer Father   . Diabetes Father    Family Psychiatric  History: See H&P. Social History:  History  Alcohol Use No     History  Drug Use No    Social History   Social History  . Marital status: Married    Spouse name: N/A  . Number of children: N/A  . Years of education: N/A   Social History Main Topics  . Smoking status: Never Smoker  . Smokeless tobacco:  Never Used  . Alcohol use No  . Drug use: No  . Sexual activity: Yes   Other Topics Concern  . None   Social History Narrative  . None   Additional Social History:    History of alcohol / drug use?: No history of alcohol / drug abuse                    Sleep: Fair  Appetite:  Fair  Current Medications: Current Facility-Administered Medications  Medication Dose Route Frequency Provider Last Rate Last Dose  . acetaminophen (TYLENOL) tablet 650 mg  650 mg Oral Q6H PRN Gonzella Lex, MD      . alum & mag hydroxide-simeth (MAALOX/MYLANTA) 200-200-20 MG/5ML suspension 30 mL  30 mL Oral Q4H PRN Gonzella Lex, MD      . amLODipine (NORVASC) tablet 10 mg  10 mg Oral Daily Gonzella Lex, MD   10 mg at 10/25/15 0820  . lamoTRIgine (LAMICTAL) tablet 25 mg  25 mg Oral Daily Gonzella Lex, MD   25 mg at 10/25/15 0820  . magnesium hydroxide (MILK OF MAGNESIA) suspension 30 mL  30 mL Oral Daily PRN Gonzella Lex, MD      . meloxicam (MOBIC) tablet 15 mg  15 mg Oral Daily Gonzella Lex, MD   15 mg at 10/25/15 0844  . PARoxetine (PAXIL) tablet  40 mg  40 mg Oral Daily Gonzella Lex, MD   40 mg at 10/25/15 0819  . traZODone (DESYREL) tablet 100 mg  100 mg Oral QHS PRN Gonzella Lex, MD        Lab Results:  Results for orders placed or performed during the hospital encounter of 10/24/15 (from the past 48 hour(s))  Lipid panel     Status: Abnormal   Collection Time: 10/25/15  6:56 AM  Result Value Ref Range   Cholesterol 186 0 - 200 mg/dL   Triglycerides 188 (H) <150 mg/dL   HDL 52 >40 mg/dL   Total CHOL/HDL Ratio 3.6 RATIO   VLDL 38 0 - 40 mg/dL   LDL Cholesterol 96 0 - 99 mg/dL    Comment:        Total Cholesterol/HDL:CHD Risk Coronary Heart Disease Risk Table                     Men   Women  1/2 Average Risk   3.4   3.3  Average Risk       5.0   4.4  2 X Average Risk   9.6   7.1  3 X Average Risk  23.4   11.0        Use the calculated Patient Ratio above and the CHD  Risk Table to determine the patient's CHD Risk.        ATP III CLASSIFICATION (LDL):  <100     mg/dL   Optimal  100-129  mg/dL   Near or Above                    Optimal  130-159  mg/dL   Borderline  160-189  mg/dL   High  >190     mg/dL   Very High   TSH     Status: Abnormal   Collection Time: 10/25/15  6:56 AM  Result Value Ref Range   TSH 4.845 (H) 0.350 - 4.500 uIU/mL    Blood Alcohol level:  Lab Results  Component Value Date   ETH <5 94/49/6759    Metabolic Disorder Labs: No results found for: HGBA1C, MPG No results found for: PROLACTIN Lab Results  Component Value Date   CHOL 186 10/25/2015   TRIG 188 (H) 10/25/2015   HDL 52 10/25/2015   CHOLHDL 3.6 10/25/2015   VLDL 38 10/25/2015   LDLCALC 96 10/25/2015    Physical Findings: AIMS:  , ,  ,  ,    CIWA:    COWS:     Musculoskeletal: Strength & Muscle Tone: within normal limits Gait & Station: normal Patient leans: N/A  Psychiatric Specialty Exam: Physical Exam  Nursing note and vitals reviewed.   Review of Systems  Psychiatric/Behavioral: Positive for depression and suicidal ideas. The patient is nervous/anxious.   All other systems reviewed and are negative.   Blood pressure 121/87, pulse 76, temperature 97.7 F (36.5 C), temperature source Oral, resp. rate 18, height _0  (1.626 m), weight 108.9 kg (240 lb), SpO2 99 %.Body mass index is 41.2 kg/m.  General Appearance: Casual  Eye Contact:  Good  Speech:  Clear and Coherent  Volume:  Normal  Mood:  Anxious and Depressed  Affect:  Appropriate  Thought Process:  Goal Directed  Orientation:  Full (Time, Place, and Person)  Thought Content:  WDL  Suicidal Thoughts:  Yes.  with intent/plan  Homicidal Thoughts:  No  Memory:  Immediate;  Fair Recent;   Fair Remote;   Fair  Judgement:  Fair  Insight:  Shallow  Psychomotor Activity:  Normal  Concentration:  Concentration: Fair and Attention Span: Fair  Recall:  [employer] of Knowledge:  Fair   Language:  Fair  Akathisia:  No  Handed:  Right  AIMS (if indicated):     Assets:  Communication Skills Desire for Improvement Financial Resources/Insurance Housing Physical Health Resilience Social Support  ADL's:  Intact  Cognition:  WNL  Sleep:  Number of Hours: 7     Treatment Plan Summary: Daily contact with patient to assess and evaluate symptoms and progress in treatment and Medication management   Nicole Cervantes is a 40 year old female with a history of depression admitted for suicidal ideation with a plan to overdose on medication in the context of recent medication adjustments.  1. Suicidal ideation. The patient is able to contract for safety in the hospital.  2. Mood. We restarted Paxil 40 mg daily and continued Lamictal titration.  3. Arthritis. She is on Mobic.  4. Hypertension. She is on Norvasc.  5. Insomnia. She is on trazodone.  6. Disposition. She will be discharged to home with her husband. She will follow up with [facility].  Orson Slick, MD 10/25/2015, 11:57 AM

## 2015-10-25 NOTE — [facility] Group Notes (Signed)
Goals Group  Date/Time: 10/25/2015, 9:00AM  Type of Therapy and Topic: Group Therapy: Goals Group: SMART Goals  ?  Participation Level: Moderate  ?  Description of Group:  ?  The purpose of a daily goals group is to assist and guide patients in setting recovery/wellness-related goals. The objective is to set goals as they relate to the crisis in which they were admitted. Patients will be using SMART goal modalities to set measurable goals. Characteristics of realistic goals will be discussed and patients will be assisted in setting and processing how one will reach their goal. Facilitator will also assist patients in applying interventions and coping skills learned in psycho-education groups to the SMART goal and process how one will achieve defined goal.  ?  Therapeutic Goals:  ?  -Patients will develop and document one goal related to or their crisis in which brought them into treatment.  -Patients will be guided by LCSW using SMART goal setting modality in how to set a measurable, attainable, realistic and time sensitive goal.  -Patients will process barriers in reaching goal.  -Patients will process interventions in how to overcome and successful in reaching goal.  ?  Patient's Goal: Patient stated that her goal for today is to work on reducing thoughts of self-harm. Pt stated that she has reoccurring thoughts of harming herself and wishes to reduce these thoughts by staying in the day room and joining all groups. ?  Therapeutic Modalities:  Motivational Interviewing  Public relations account executive Therapy  Crisis Intervention Model  SMART goals setting  Glorious Peach, MSW, LCSW-A

## 2015-10-25 NOTE — Progress Notes (Signed)
D: Patient stated slept poorast night .Stated appetite is good and energy level  Is normal. Stated concentration is good . Stated on Depression scale 6 hopeless 2 and anxiety0 .( low 0-10 high) Denies suicidal  homicidal ideations  .  No auditory hallucinations  No pain concerns . Appropriate ADL'S. Interacting with peers and staff.  A: Encourage patient participation with unit programming . Instruction  Given on  Medication , verbalize understanding. Working on Radiographer, therapeutic  R: Voice no other concerns. Staff continue to monitor

## 2015-10-25 NOTE — [facility] Group Notes (Signed)
[place] LCSW Group Therapy   10/25/2015 1:00 pm   Type of Therapy: Group Therapy   Participation Level: Active   Participation Quality: Attentive, Sharing and Supportive   Affect: Appropriate   Cognitive: Alert and Oriented   Insight: Developing/Improving and Engaged   Engagement in Therapy: Developing/Improving and Engaged   Modes of Intervention: Clarification, Confrontation, Discussion, Education, Exploration, Limit-setting, Orientation, Problem-solving, Rapport Building, Art therapist, Socialization and Support   Summary of Progress/Problems: The topic for group was balance in life. Today's group focused on defining balance in one's own words, identifying things that can knock one off balance, and exploring healthy ways to maintain balance in life. Group members were asked to provide an example of a time when they felt off balance, describe how they handled that situation, and process healthier ways to regain balance in the future. Group members were asked to share the most important tool for maintaining balance that they learned while at [facility] and how they plan to apply this method after discharge. Patient defined balance as "trying to manage life and work." She stated that her two significant out-of-balance areas are mental/emotional and spirituality health. Pt's goal to achieve balance is joining church, complying with medications and therapy.     Glorious Peach, MSW, LCSW-A 10/25/2015, [id number]

## 2015-10-26 LAB — HEMOGLOBIN A1C
HEMOGLOBIN A1C: 5.7 % — AB (ref 4.8–5.6)
Mean Plasma Glucose: 117 mg/dL

## 2015-10-26 MED ORDER — TRAZODONE HCL 100 MG PO TABS: 100.0000 mg | ORAL_TABLET | Freq: Every evening | ORAL | 1 refills | Status: DC | PRN

## 2015-10-26 MED ORDER — PAROXETINE HCL 40 MG PO TABS: 40.0000 mg | ORAL_TABLET | ORAL | 1 refills | Status: DC

## 2015-10-26 MED ORDER — LAMOTRIGINE 25 MG PO TABS: 50.0000 mg | ORAL_TABLET | Freq: Every day | ORAL | 1 refills | Status: DC

## 2015-10-26 NOTE — Tx Team (Signed)
Interdisciplinary Treatment and Diagnostic Plan Update  10/26/2015 Time of Session: 11:00am Nicole Cervantes MRN: [id number]  Principal Diagnosis: Severe episode of recurrent major depressive disorder, without psychotic features ([place])  Secondary Diagnoses: Principal Problem:   Severe episode of recurrent major depressive disorder, without psychotic features ([place]) Active Problems:   Back pain   Suicidal ideation   Current Medications:  Current Facility-Administered Medications  Medication Dose Route Frequency Provider Last Rate Last Dose  . acetaminophen (TYLENOL) tablet 650 mg  650 mg Oral Q6H PRN Gonzella Lex, MD      . alum & mag hydroxide-simeth (MAALOX/MYLANTA) 200-200-20 MG/5ML suspension 30 mL  30 mL Oral Q4H PRN Gonzella Lex, MD      . amLODipine (NORVASC) tablet 10 mg  10 mg Oral Daily Gonzella Lex, MD   10 mg at 10/26/15 0859  . lamoTRIgine (LAMICTAL) tablet 25 mg  25 mg Oral Daily Gonzella Lex, MD   25 mg at 10/26/15 0859  . magnesium hydroxide (MILK OF MAGNESIA) suspension 30 mL  30 mL Oral Daily PRN Gonzella Lex, MD      . meloxicam (MOBIC) tablet 15 mg  15 mg Oral Daily Gonzella Lex, MD   15 mg at 10/26/15 0859  . PARoxetine (PAXIL) tablet 40 mg  40 mg Oral Daily Gonzella Lex, MD   40 mg at 10/26/15 0859  . traZODone (DESYREL) tablet 100 mg  100 mg Oral QHS PRN Gonzella Lex, MD       PTA Medications: Prescriptions Prior to Admission  Medication Sig Dispense Refill Last Dose  . amLODipine (NORVASC) 10 MG tablet Take 10 mg by mouth daily.   unknown at unknown  . hydrochlorothiazide (HYDRODIURIL) 25 MG tablet Take 25 mg by mouth daily.   unknown at unknown  . lamoTRIgine (LAMICTAL) 25 MG tablet Take 25 mg by mouth daily. Take 1 tablet daily for 2 weeks than 2 tablets daily for 2 weeks   unknown at unknown  . meloxicam (MOBIC) 15 MG tablet Take 1 tablet (15 mg total) by mouth daily. 30 tablet 0 unknown at unknown  . [DISCONTINUED] PARoxetine (PAXIL) 40 MG tablet  Take 40 mg by mouth every morning.       Treatment Modalities: Medication Management, Group therapy, Case management,  1 to 1 session with clinician, Psychoeducation, Recreational therapy.   Physician Treatment Plan for Primary Diagnosis: Severe episode of recurrent major depressive disorder, without psychotic features ([place]) Long Term Goal(s): Improvement in symptoms so as ready for discharge   Short Term Goals: Ability to verbalize feelings will improve, Ability to demonstrate self-control will improve, Ability to identify and develop effective coping behaviors will improve and Ability to maintain clinical measurements within normal limits will improve  Medication Management: Evaluate patient's response, side effects, and tolerance of medication regimen.  Therapeutic Interventions: 1 to 1 sessions, Unit Group sessions and Medication administration.  Evaluation of Outcomes: Adequate for Discharge  Physician Treatment Plan for Secondary Diagnosis: Principal Problem:   Severe episode of recurrent major depressive disorder, without psychotic features ([place]) Active Problems:   Back pain   Suicidal ideation  Long Term Goal(s): Improvement in symptoms so as ready for discharge  Short Term Goals: Ability to verbalize feelings will improve, Ability to demonstrate self-control will improve, Ability to identify and develop effective coping behaviors will improve and Ability to maintain clinical measurements within normal limits will improve  Medication Management: Evaluate patient's response, side effects, and tolerance of medication  regimen.  Therapeutic Interventions: 1 to 1 sessions, Unit Group sessions and Medication administration.  Evaluation of Outcomes: Adequate for Discharge   RN Treatment Plan for Primary Diagnosis: Severe episode of recurrent major depressive disorder, without psychotic features ([place]) Long Term Goal(s): Knowledge of disease and therapeutic regimen to maintain  health will improve  Short Term Goals: Ability to verbalize feelings will improve, Ability to disclose and discuss suicidal ideas, Ability to identify and develop effective coping behaviors will improve and Compliance with prescribed medications will improve  Medication Management: RN will administer medications as ordered by provider, will assess and evaluate patient's response and provide education to patient for prescribed medication. RN will report any adverse and/or side effects to prescribing provider.  Therapeutic Interventions: 1 on 1 counseling sessions, Psychoeducation, Medication administration, Evaluate responses to treatment, Monitor vital signs and CBGs as ordered, Perform/monitor CIWA, COWS, AIMS and Fall Risk screenings as ordered, Perform wound care treatments as ordered.  Evaluation of Outcomes: Adequate for Discharge   LCSW Treatment Plan for Primary Diagnosis: Severe episode of recurrent major depressive disorder, without psychotic features ([place]) Long Term Goal(s): Safe transition to appropriate next level of care at discharge, Engage patient in therapeutic group addressing interpersonal concerns.  Short Term Goals: Engage patient in aftercare planning with referrals and resources, Increase ability to appropriately verbalize feelings, Increase emotional regulation, Identify triggers associated with mental health/substance abuse issues and Increase skills for wellness and recovery  Therapeutic Interventions: Assess for all discharge needs, 1 to 1 time with Social worker, Explore available resources and support systems, Assess for adequacy in community support network, Educate family and significant other(s) on suicide prevention, Complete Psychosocial Assessment, Interpersonal group therapy.  Evaluation of Outcomes: Adequate for Discharge   Progress in Treatment: Attending groups: Yes. Participating in groups: Yes. Taking medication as prescribed: Yes. Toleration  medication: Yes. Family/Significant other contact made: Yes, individual(s) contacted:  husband Patient understands diagnosis: Yes. Discussing patient identified problems/goals with staff: Yes. Medical problems stabilized or resolved: Yes. Denies suicidal/homicidal ideation: Yes. Issues/concerns per patient self-inventory: No. Other: n/a  New problem(s) identified: None identified at this time.   New Short Term/Long Term Goal(s): None identified at this time.  Discharge Plan or Barriers: Patient will be discharged back home to live with her husband. Patient has follow-up with [facility] for medication management and outpatient therapy.  Reason for Continuation of Hospitalization: Anticipated discharge 10/26/2015.  Estimated Length of Stay: Anticipated discharge 10/26/2015.  Attendees: Patient:Nicole Cervantes 10/26/2015 1:49 PM  Physician: Dr. Bary Leriche, MD 10/26/2015 1:49 PM  Nursing: Elige Radon, RN 10/26/2015 1:49 PM  RN Care Manager: 10/26/2015 1:49 PM  Social Worker: Lear Ng. Claybon Jabs MSW, [place] 10/26/2015 1:49 PM  Recreational Therapist: Leonette Monarch, LRT/CTRS 10/26/2015 1:49 PM  Other:  10/26/2015 1:49 PM  Other:  10/26/2015 1:49 PM  Other: 10/26/2015 1:49 PM    Scribe for Treatment Team: Lear Ng. [place], [place] 10/26/2015 1:55 PM

## 2015-10-26 NOTE — BHH Group Notes (Addendum)
[place] LCSW Group Therapy   10/26/2015 9:30 AM   Type of Therapy: Group Therapy   Participation Level: Active   Participation Quality: Attentive, Sharing and Supportive   Affect: Appropriate   Cognitive: Alert and Oriented   Insight: Developing/Improving and Engaged   Engagement in Therapy: Developing/Improving and Engaged   Modes of Intervention: Clarification, Confrontation, Discussion, Education, Exploration, Limit-setting, Orientation, Problem-solving, Rapport Building, Art therapist, Socialization and Support   Summary of Progress/Problems: The topic for today was feelings about relapse. Pt discussed what relapse prevention is to them and identified triggers that they are on the path to relapse. Pt processed their feeling towards relapse and was able to relate to peers. Pt discussed coping skills that can be used for relapse prevention.  Pt defined relapse as "setbacks." She stated that the symptoms she has more during a relapse is visual hallucinations and feeling "trapped" due to worsening anxiety. Pt identified educating family, breathing exercises, and going for walks as ways to reduce exposure to triggers and prevent relapse.     Glorious Peach, MSW, LCSWA 10/26/2015, 11:04AM

## 2015-10-26 NOTE — BHH Suicide Risk Assessment (Signed)
[facility] Discharge Suicide Risk Assessment   Principal Problem: Severe episode of recurrent major depressive disorder, without psychotic features [facility]) Discharge Diagnoses:  Patient Active Problem List   Diagnosis Date Noted  . Suicidal ideation [R45.851] 10/25/2015  . Severe episode of recurrent major depressive disorder, without psychotic features ([place]) [F33.2] 10/24/2015  . Hypertension [I10] 10/23/2015  . Back pain [M54.9] 10/23/2015    Total Time spent with patient: 30 minutes  Musculoskeletal: Strength & Muscle Tone: within normal limits Gait & Station: normal Patient leans: N/A  Psychiatric Specialty Exam: Review of Systems  All other systems reviewed and are negative.   Blood pressure 132/86, pulse 75, temperature 98 F (36.7 C), temperature source Oral, resp. rate 18, height 5\' 4"  (1.626 m), weight 108.9 kg (240 lb), SpO2 99 %.Body mass index is 41.2 kg/m.  General Appearance: Casual  Eye Contact::  Good  Speech:  Clear and [id number]  Volume:  Normal  Mood:  Euthymic  Affect:  Appropriate  Thought Process:  Goal Directed  Orientation:  Full (Time, Place, and Person)  Thought Content:  WDL  Suicidal Thoughts:  No  Homicidal Thoughts:  No  Memory:  Immediate;   Fair Recent;   Fair Remote;   Fair  Judgement:  Impaired  Insight:  Shallow  Psychomotor Activity:  Normal  Concentration:  Fair  Recall:  [employer] [place]  Language: Fair  Akathisia:  No  Handed:  Right  AIMS (if indicated):     Assets:  Communication Skills Desire for Improvement Financial Resources/Insurance Housing Intimacy Physical Health Resilience Social Support Transportation Vocational/Educational  Sleep:  Number of Hours: 6.25  Cognition: WNL  ADL's:  Intact   Mental Status Per Nursing Assessment::   On Admission:  Suicidal ideation indicated by patient, Self-harm thoughts (pt currently denies SI. reports thoughts to OD yesterday)  Demographic Factors:   Caucasian  Loss Factors: NA  Historical Factors: Prior suicide attempts, Family history of mental illness or substance abuse and Impulsivity  Risk Reduction Factors:   Responsible for children under 3 years of age, Sense of responsibility to family, Employed, Living with another person, especially a relative, Positive social support and Positive therapeutic relationship  Continued Clinical Symptoms:  Bipolar Disorder:   Depressive phase Depression:   Impulsivity  Cognitive Features That Contribute To Risk:  None    Suicide Risk:  Minimal: No identifiable suicidal ideation.  Patients presenting with no risk factors but with morbid ruminations; may be classified as minimal risk based on the severity of the depressive symptoms  Follow-up [place]. Go on 10/29/2015.   Why:  Please arrive to the clinic at 8am for an assessment for medication management and therapy.  Arrive as early as possible for prompt service. Please call Sherrian Divers at [phone] for questions and assistance. Contact information: [place] [address] Ph: [phone] Fax: [phone]          Plan Of Care/Follow-up recommendations:  Activity:  As tolerated. Diet:  Low sodium heart healthy. Other:  Keep follow-up appointments.  Orson Slick, MD 10/26/2015, 12:22 PM

## 2015-10-26 NOTE — BHH Group Notes (Signed)
[place] Group Notes:  (Nursing/MHT/Case Management/Adjunct)  Date:  10/26/2015  Time:  4:13 AM  Type of Therapy:  Psychoeducational Skills  Participation Level:  Active  Participation Quality:  Appropriate, Attentive and Sharing  Affect:  Appropriate  Cognitive:  Appropriate  Insight:  Appropriate and Good  Engagement in Group:  Engaged  Modes of Intervention:  Discussion, Socialization and Support  Summary of Progress/Problems:  Reece Agar 10/26/2015, 4:13 AM

## 2015-10-26 NOTE — Discharge Summary (Signed)
Physician Discharge Summary Note  Patient:  Nicole Cervantes is an 40 y.o., female MRN:  [id number] DOB:  12-Sep-1975 Patient phone:  [phone] (home)  Patient address:   [address] [place] [address],  Total Time spent with patient: 30 minutes  Date of Admission:  10/24/2015 Date of Discharge: 10/26/2015  Reason for Admission:  Suicidal ideation.  History of Present Illness: This is a 40 year old woman who presented to the emergency room with symptoms of worsening depression. Her mood had been getting worse with increased hopelessness and more suicidal ideation with thoughts of wrecking her car or otherwise trying to harm herself. Patient was aware that she was feeling worse and was voluntarily seeking treatment. She had had a relatively recent change in her medication in that her outpatient provider at discontinued her peroxide teen and started her on Lamictal. She was still at only the lowest dose of an upward taper of Lamictal. Patient had been given a diagnosis of bipolar disorder although she does not describe any clear history of mania or hypomania. Not abusing substances. Associated Signs/Symptoms: Depression Symptoms:  depressed mood, psychomotor retardation, fatigue, feelings of worthlessness/guilt, difficulty concentrating, hopelessness, suicidal thoughts with specific plan, (Hypo) Manic Symptoms:  None Anxiety Symptoms:  Excessive Worry, Psychotic Symptoms:  None PTSD Symptoms: Negative  Past Psychiatric History: Patient has a long-standing history of depression with a possible diagnosis of bipolar disorder. She does have a past history of suicide attempts. Patient had been stable for quite a while but recently had a decompensation. No known clear-cut psychosis social stress. No history of psychosis or mania. Had been doing well on Paxil. Sees a therapist as well.  Family Psychiatric  History: Positive for depression   Principal Problem: Severe episode of  recurrent major depressive disorder, without psychotic features [facility]) Discharge Diagnoses: Patient Active Problem List   Diagnosis Date Noted  . Suicidal ideation [R45.851] 10/25/2015  . Severe episode of recurrent major depressive disorder, without psychotic features ([place]) [F33.2] 10/24/2015  . Hypertension [I10] 10/23/2015  . Back pain [M54.9] 10/23/2015   Past Medical History:  Past Medical History:  Diagnosis Date  . Back pain   . Bipolar 1 disorder ([place])   . Hypertension     Past Surgical History:  Procedure Laterality Date  . ABDOMINAL HYSTERECTOMY  2009  . Cyst removal to lower back    . OVARIAN CYST REMOVAL    . SKIN BIOPSY    . TONSILLECTOMY     Family History:  Family History  Problem Relation Age of Onset  . Diabetes Mother   . Bladder Cancer Father   . Prostate cancer Father   . Diabetes Father    Social History:  History  Alcohol Use No     History  Drug Use No    Social History   Social History  . Marital status: Married    Spouse name: N/A  . Number of children: N/A  . Years of education: N/A   Social History Main Topics  . Smoking status: Never Smoker  . Smokeless tobacco: Never Used  . Alcohol use No  . Drug use: No  . Sexual activity: Yes   Other Topics Concern  . None   Social History Narrative  . None    Hospital Course:    Nicole Cervantes is a 40 year old female with a history of depression admitted for suicidal ideation with a plan to overdose on medication in the context of recent medication adjustments.  1. Suicidal  ideation. This has resolved. The patient is able to contract for safety. She is forward thinking and optimistic about the future.  2. Mood. We restarted Paxil 40 mg daily and continued Lamictal titration.  3. Arthritis. She is on Mobic.  4. Hypertension. She is on Norvasc.  5. Insomnia. She is on trazodone.  6. Disposition. She was discharged to home with her husband. She will follow up with  [facility].  Physical Findings: AIMS:  , ,  ,  ,    CIWA:    COWS:     Musculoskeletal: Strength & Muscle Tone: within normal limits Gait & Station: normal Patient leans: N/A  Psychiatric Specialty Exam: Physical Exam  Nursing note and vitals reviewed.   Review of Systems  All other systems reviewed and are negative.   Blood pressure 132/86, pulse 75, temperature 98 F (36.7 C), temperature source Oral, resp. rate 18, height 5\' 4"  (1.626 m), weight 108.9 kg (240 lb), SpO2 99 %.Body mass index is 41.2 kg/m.  See SRA.                                                  Sleep:  Number of Hours: 6.25     Have you used any form of tobacco in the last 30 days? (Cigarettes, Smokeless Tobacco, Cigars, and/or Pipes): No  Has this patient used any form of tobacco in the last 30 days? (Cigarettes, Smokeless Tobacco, Cigars, and/or Pipes) Yes, No  Blood Alcohol level:  Lab Results  Component Value Date   ETH <5 [id number]    Metabolic Disorder Labs:  Lab Results  Component Value Date   HGBA1C 5.7 (H) 10/25/2015   MPG 117 10/25/2015   No results found for: PROLACTIN Lab Results  Component Value Date   CHOL 186 10/25/2015   TRIG 188 (H) 10/25/2015   HDL 52 10/25/2015   CHOLHDL 3.6 10/25/2015   VLDL 38 10/25/2015   LDLCALC 96 10/25/2015    See Psychiatric Specialty Exam and Suicide Risk Assessment completed by Attending Physician prior to discharge.  Discharge destination:  Home  Is patient on multiple antipsychotic therapies at discharge:  No   Has Patient had three or more failed trials of antipsychotic monotherapy by history:  No  Recommended Plan for Multiple Antipsychotic Therapies: NA  Discharge Instructions    Diet - low sodium heart healthy    Complete by:  As directed    Increase activity slowly    Complete by:  As directed        Medication List    TAKE these medications     Indication  amLODipine 10 MG tablet Commonly known as:   NORVASC Take 10 mg by mouth daily.  Indication:  High Blood Pressure Disorder   hydrochlorothiazide 25 MG tablet Commonly known as:  HYDRODIURIL Take 25 mg by mouth daily.  Indication:  High Blood Pressure Disorder   lamoTRIgine 25 MG tablet Commonly known as:  LAMICTAL Take 25 mg by mouth daily. Take 1 tablet daily for 2 weeks than 2 tablets daily for 2 weeks What changed:  Another medication with the same name was added. Make sure you understand how and when to take each.  Indication:  Manic-Depression   lamoTRIgine 25 MG tablet Commonly known as:  LAMICTAL Take 2 tablets (50 mg total) by mouth at bedtime. Take 4 tabs  at bedtime for 2 weeks beginning on 11/13/2015, 8 tabs or 200 mg after. Start taking on:  11/13/2015 What changed:  You were already taking a medication with the same name, and this prescription was added. Make sure you understand how and when to take each.  Indication:  Manic-Depression   meloxicam 15 MG tablet Commonly known as:  MOBIC Take 1 tablet (15 mg total) by mouth daily.  Indication:  Joint Damage causing Pain and Loss of Function   PARoxetine 40 MG tablet Commonly known as:  PAXIL Take 1 tablet (40 mg total) by mouth every morning.  Indication:  Depressive Phase of Manic-Depression   traZODone 100 MG tablet Commonly known as:  DESYREL Take 1 tablet (100 mg total) by mouth at bedtime as needed for sleep.  Indication:  [place]. Go on 10/29/2015.   Why:  Please arrive to the clinic at 8am for an assessment for medication management and therapy.  Arrive as early as possible for prompt service. Please call Sherrian Divers at [phone] for questions and assistance. Contact information: [place] of [place] [address] Ph: [phone] Fax: [phone]          Follow-up recommendations:  Activity:  As tolerated. Diet:  Low sodium heart  healthy. Other:  Keep follow-up appointments.  Comments:    Signed: Orson Slick, MD 10/26/2015, 4:07 PM

## 2015-10-26 NOTE — Progress Notes (Signed)
Patient denies SI/HI, denies A/V hallucinations. Patient verbalizes understanding of discharge instructions, follow up care and prescriptions. Patient given all belongings. Patient escorted out by staff, transported by family.

## 2015-10-26 NOTE — Progress Notes (Signed)
  [facility] Adult Case Management Discharge Plan :  Will you be returning to the same living situation after discharge:  Yes,  patient will be returning home. At discharge, do you have transportation home?: Yes,  husband Do you have the ability to pay for your medications: Yes,  patient has insurance  Release of information consent forms completed and in the chart;  Patient's signature needed at discharge.  Patient to Follow up at: Follow-up Information    [facility]. Go on 10/29/2015.   Why:  Please arrive to the clinic at 8am for an assessment for medication management and therapy.  Arrive as early as possible for prompt service. Please call Sherrian Divers at [phone] for questions and assistance. Contact information: [place] of [place] [address] Ph: [phone] Fax: [phone]          Next level of care provider has access to [place] and Suicide Prevention discussed: Yes,  with patient and husband  Have you used any form of tobacco in the last 30 days? (Cigarettes, Smokeless Tobacco, Cigars, and/or Pipes): No  Has patient been referred to the Quitline?: N/A patient is not a smoker  Patient has been referred for addiction treatment: N/A  Maryl Blalock G. [place], [place] 10/26/2015, 1:56 PM

## 2015-10-26 NOTE — Progress Notes (Signed)
Recreation Therapy Notes  INPATIENT RECREATION TR PLAN  Patient Details Name: ALLIYAH ROESLER MRN: [id number] DOB: [birth year] Today's Date: 10/26/2015  Rec Therapy Plan Is patient appropriate for Therapeutic Recreation?: Yes Treatment times per week: At least once a week TR Treatment/Interventions: 1:1 session, Group participation (Comment) (Appropriate participation in daily recreational therapy tx)  Discharge Criteria Pt will be discharged from therapy if:: Treatment goals are met, Discharged Treatment plan/goals/alternatives discussed and agreed upon by:: Patient/family  Discharge Summary Short term goals set: See Care Plan Short term goals met: Complete Progress toward goals comments: One-to-one attended Which groups?: Coping skills, Leisure education, Other (Comment) (Self-expression) One-to-one attended: Self-esteem, stress management Reason goals not met: N/A Therapeutic equipment acquired: None Reason patient discharged from therapy: Discharge from hospital Pt/family agrees with progress & goals achieved: Yes Date patient discharged from therapy: 10/26/15   Leonette Monarch, LRT/CTRS 10/26/2015, 3:42 PM

## 2015-10-26 NOTE — Plan of Care (Signed)
Problem: [facility] Participation in Recreation Therapeutic Interventions Goal: STG-Patient will demonstrate improved self esteem by identif STG: Self-Esteem - Within 3 treatment sessions, patient will verbalize at least 5 positive affirmation statements in one treatment session to increase self-esteem post d/c.  Outcome: Completed/Met Date Met: 10/26/15 Treatment Session 1; Completed 1 out of 1: At approximately 1:50 pm, LRT met with patient in consultation room. Patient verbalized 5 positive affirmation statements. Patient reported, "It almost makes me want to cry." LRT encouraged patient to continue saying positive affirmation statements.  Leonette Monarch, LRT/CTRS 09.22.17 3:39 pm Goal: STG-Other Recreation Therapy Goal (Specify) STG: Stress Management - Within 3 treatment sessions, patient will verbalize understanding of the stress management techniques in one treatment session to increase stress management skills post d/c.  Outcome: Completed/Met Date Met: 10/26/15 Treatment Session 1; Completed 1 out of 1: At approximately 1:50 pm, LRT met with patient in consultation room. LRT educated and provided patient with handouts on stress management techniques. Patient verbalized understanding. LRT encouraged patient to read over and practice the stress management techniques.  Leonette Monarch, LRT/CTRS 09.22.17 3:41 pm

## 2015-10-26 NOTE — Progress Notes (Signed)
D: Flat affect. Interacts well with others and visible in the milieu. Went to group and ate snack. She denies SI/HI/AVH. Patient talked about her coping skills. Patient reports edema in RLE, LLE. +1 pitting in both.  A: No HS medication scheduled. Encouragement provided.  R: Patient has been calm and cooperative. Safety maintained with 15 min checks.

## 2015-10-26 NOTE — Plan of Care (Signed)
Problem: Safety: Goal: Ability to remain free from injury will improve Outcome: Progressing Patient has remained free from injury during this shift.   

## 2015-10-29 DIAGNOSIS — F3132 Bipolar disorder, current episode depressed, moderate: Secondary | ICD-10-CM | POA: Diagnosis not present

## 2015-11-05 DIAGNOSIS — Z0000 Encounter for general adult medical examination without abnormal findings: Secondary | ICD-10-CM | POA: Diagnosis not present

## 2015-11-05 DIAGNOSIS — I10 Essential (primary) hypertension: Secondary | ICD-10-CM | POA: Diagnosis not present

## 2015-11-05 DIAGNOSIS — R946 Abnormal results of thyroid function studies: Secondary | ICD-10-CM | POA: Diagnosis not present

## 2015-11-05 DIAGNOSIS — Z8639 Personal history of other endocrine, nutritional and metabolic disease: Secondary | ICD-10-CM | POA: Diagnosis not present

## 2015-12-10 DIAGNOSIS — F3132 Bipolar disorder, current episode depressed, moderate: Secondary | ICD-10-CM | POA: Diagnosis not present

## 2016-03-12 DIAGNOSIS — H66001 Acute suppurative otitis media without spontaneous rupture of ear drum, right ear: Secondary | ICD-10-CM | POA: Diagnosis not present

## 2016-03-12 DIAGNOSIS — R51 Headache: Secondary | ICD-10-CM | POA: Diagnosis not present

## 2016-03-12 DIAGNOSIS — Z6839 Body mass index (BMI) 39.0-39.9, adult: Secondary | ICD-10-CM | POA: Diagnosis not present

## 2016-03-21 DIAGNOSIS — M5412 Radiculopathy, cervical region: Secondary | ICD-10-CM | POA: Diagnosis not present

## 2016-03-21 DIAGNOSIS — M5030 Other cervical disc degeneration, unspecified cervical region: Secondary | ICD-10-CM | POA: Diagnosis not present

## 2016-03-25 ENCOUNTER — Other Ambulatory Visit: Payer: Self-pay | Admitting: Orthopedic Surgery

## 2016-03-25 DIAGNOSIS — M5412 Radiculopathy, cervical region: Secondary | ICD-10-CM

## 2016-03-25 DIAGNOSIS — M5030 Other cervical disc degeneration, unspecified cervical region: Secondary | ICD-10-CM

## 2016-04-11 DIAGNOSIS — M25511 Pain in right shoulder: Secondary | ICD-10-CM | POA: Diagnosis not present

## 2016-04-11 DIAGNOSIS — M19011 Primary osteoarthritis, right shoulder: Secondary | ICD-10-CM | POA: Diagnosis not present

## 2016-04-16 ENCOUNTER — Ambulatory Visit
Admission: RE | Admit: 2016-04-16 | Discharge: 2016-04-16 | Disposition: A | Discharge status: Home / Self Care | Payer: BLUE CROSS/BLUE SHIELD | Source: Ambulatory Visit | Attending: Orthopedic Surgery | Admitting: Orthopedic Surgery

## 2016-04-16 DIAGNOSIS — M47892 Other spondylosis, cervical region: Secondary | ICD-10-CM | POA: Diagnosis not present

## 2016-04-16 DIAGNOSIS — M5030 Other cervical disc degeneration, unspecified cervical region: Secondary | ICD-10-CM | POA: Diagnosis not present

## 2016-04-16 DIAGNOSIS — M50221 Other cervical disc displacement at C4-C5 level: Secondary | ICD-10-CM | POA: Diagnosis not present

## 2016-04-16 DIAGNOSIS — M4802 Spinal stenosis, cervical region: Secondary | ICD-10-CM | POA: Diagnosis not present

## 2016-04-16 DIAGNOSIS — M5412 Radiculopathy, cervical region: Secondary | ICD-10-CM | POA: Diagnosis not present

## 2016-06-06 DIAGNOSIS — M5030 Other cervical disc degeneration, unspecified cervical region: Secondary | ICD-10-CM | POA: Diagnosis not present

## 2016-06-06 DIAGNOSIS — M5412 Radiculopathy, cervical region: Secondary | ICD-10-CM | POA: Diagnosis not present

## 2016-06-17 ENCOUNTER — Encounter: Payer: Self-pay | Admitting: Emergency Medicine

## 2016-06-17 ENCOUNTER — Emergency Department
Admission: EM | Admit: 2016-06-17 | Discharge: 2016-06-17 | Disposition: A | Discharge status: Home / Self Care | Payer: BLUE CROSS/BLUE SHIELD | Attending: Emergency Medicine | Admitting: Emergency Medicine

## 2016-06-17 ENCOUNTER — Emergency Department: Payer: BLUE CROSS/BLUE SHIELD

## 2016-06-17 DIAGNOSIS — R0602 Shortness of breath: Secondary | ICD-10-CM | POA: Diagnosis not present

## 2016-06-17 DIAGNOSIS — I10 Essential (primary) hypertension: Secondary | ICD-10-CM | POA: Diagnosis not present

## 2016-06-17 DIAGNOSIS — R0789 Other chest pain: Secondary | ICD-10-CM | POA: Diagnosis not present

## 2016-06-17 DIAGNOSIS — R079 Chest pain, unspecified: Secondary | ICD-10-CM | POA: Diagnosis not present

## 2016-06-17 DIAGNOSIS — Z79899 Other long term (current) drug therapy: Secondary | ICD-10-CM | POA: Diagnosis not present

## 2016-06-17 LAB — BASIC METABOLIC PANEL
Anion gap: 9 (ref 5–15)
BUN: 10 mg/dL (ref 6–20)
CALCIUM: 8.6 mg/dL — AB (ref 8.9–10.3)
CHLORIDE: 104 mmol/L (ref 101–111)
CO2: 24 mmol/L (ref 22–32)
CREATININE: 0.62 mg/dL (ref 0.44–1.00)
GFR calc non Af Amer: >60 mL/min (ref >60)
Glucose, Bld: 140 mg/dL — ABNORMAL HIGH (ref 65–99)
Potassium: 3.1 mmol/L — ABNORMAL LOW (ref 3.5–5.1)
SODIUM: 137 mmol/L (ref 135–145)

## 2016-06-17 LAB — TROPONIN I
Troponin I: <0.03 ng/mL (ref <0.03)
Troponin I: <0.03 ng/mL (ref <0.03)

## 2016-06-17 LAB — CBC
HCT: 39.6 % (ref 35.0–47.0)
Hemoglobin: 13.8 g/dL (ref 12.0–16.0)
MCH: 30.8 pg (ref 26.0–34.0)
MCHC: 35 g/dL (ref 32.0–36.0)
MCV: 88.2 fL (ref 80.0–100.0)
PLATELETS: 224 10*3/uL (ref 150–440)
RBC: 4.49 MIL/uL (ref 3.80–5.20)
RDW: 12.9 % (ref 11.5–14.5)
WBC: 8.3 10*3/uL (ref 3.6–11.0)

## 2016-06-17 LAB — BRAIN NATRIURETIC PEPTIDE: B Natriuretic Peptide: 34 pg/mL (ref 0.0–100.0)

## 2016-06-17 MED ORDER — POTASSIUM CHLORIDE CRYS ER 20 MEQ PO TBCR
40.0000 meq | EXTENDED_RELEASE_TABLET | Freq: Once | ORAL | Status: AC
  Administered 2016-06-17: 40 meq via ORAL
  Filled 2016-06-17: qty 2

## 2016-06-17 MED ORDER — SODIUM CHLORIDE 0.9 % IV BOLUS (SEPSIS): 1000.0000 mL | Freq: Once | INTRAVENOUS | Status: DC

## 2016-06-17 MED ORDER — ACETAMINOPHEN 325 MG PO TABS
650.0000 mg | ORAL_TABLET | Freq: Once | ORAL | Status: AC
  Administered 2016-06-17: 650 mg via ORAL
  Filled 2016-06-17: qty 2

## 2016-06-17 NOTE — ED Notes (Signed)
EPIC unable to load in room, unable e-sign for DC paperwork, pt verbalized understanding.

## 2016-06-17 NOTE — ED Triage Notes (Signed)
Pt c/o left sided constant chest pain that started early this morning. Also reports pain radiates to left shoulder.  No respiratory distress currently. Skin warm and dry in triage.  Has had similar sx in past that she saw doctor for but they did not find cause.

## 2016-06-17 NOTE — Discharge Instructions (Signed)
Take tylenol, motrin for pain.   See your doctor. Recommend stress test if you still have pain   Return to ER if you have worse chest pain, shortness of breath.

## 2016-06-17 NOTE — ED Provider Notes (Signed)
[facility] Provider Note   CSN: [id number] Arrival date & time: 06/17/16  0707     History   Chief Complaint Chief Complaint  Patient presents with  . Chest Pain    HPI Nicole Cervantes is a 41 y.o. female hx of HTN, bipolar here with chest pain. Left-sided chest pain this morning. States that he does radiate up the left jaw and left shoulder. Associated with some subjective chills. Denies any fevers or cough or shortness of breath. She driving to work and the pain got worse so she came in for evaluation. Denies any leg swelling or recent travel or history of blood clots. Denies any history of coronary artery disease. Patient states that this happened to her before and she was checked up by her doctor but was never diagnosed with any heart problems.   The history is provided by the patient and the spouse.    Past Medical History:  Diagnosis Date  . Back pain   . Bipolar 1 disorder ([place])   . Hypertension     Patient Active Problem List   Diagnosis Date Noted  . Suicidal ideation 10/25/2015  . Severe episode of recurrent major depressive disorder, without psychotic features ([place]) 10/24/2015  . Hypertension 10/23/2015  . Back pain 10/23/2015    Past Surgical History:  Procedure Laterality Date  . ABDOMINAL HYSTERECTOMY  2009  . Cyst removal to lower back    . OVARIAN CYST REMOVAL    . SKIN BIOPSY    . TONSILLECTOMY      OB History    No data available       Home Medications    Prior to Admission medications   Medication Sig Start Date End Date Taking? Authorizing Provider  amLODipine (NORVASC) 10 MG tablet Take 10 mg by mouth daily.    [provider]  hydrochlorothiazide (HYDRODIURIL) 25 MG tablet Take 25 mg by mouth daily.    [provider]  lamoTRIgine (LAMICTAL) 25 MG tablet Take 25 mg by mouth daily. Take 1 tablet daily for 2 weeks than 2 tablets daily for 2 weeks 10/10/15   [provider]  lamoTRIgine (LAMICTAL) 25  MG tablet Take 2 tablets (50 mg total) by mouth at bedtime. Take 4 tabs at bedtime for 2 weeks beginning on 11/13/2015, 8 tabs or 200 mg after. 11/13/15   Pucilowska, Herma Ard B, MD  meloxicam (MOBIC) 15 MG tablet Take 1 tablet (15 mg total) by mouth daily. 01/06/15   Triplett, Johnette Abraham B, FNP  PARoxetine (PAXIL) 40 MG tablet Take 1 tablet (40 mg total) by mouth every morning. 10/26/15   Pucilowska, Jolanta B, MD  traZODone (DESYREL) 100 MG tablet Take 1 tablet (100 mg total) by mouth at bedtime as needed for sleep. 10/26/15   Pucilowska, Wardell Honour, MD    Family History Family History  Problem Relation Age of Onset  . Diabetes Mother   . Bladder Cancer Father   . Prostate cancer Father   . Diabetes Father     Social History Social History  Substance Use Topics  . Smoking status: Never Smoker  . Smokeless tobacco: Never Used  . Alcohol use No     Allergies   Patient has no known allergies.   Review of Systems Review of Systems  Cardiovascular: Positive for chest pain.  All other systems reviewed and are negative.    Physical Exam Updated Vital Signs BP 112/75   Pulse 62   Temp 98.1 F (36.7 C) (Oral)  Resp 13   Ht 5\' 4"  (1.626 m)   Wt 224 lb (101.6 kg)   SpO2 92%   BMI 38.45 kg/m   Physical Exam  Constitutional: She is oriented to person, place, and time.  Slightly anxious   HENT:  Head: Normocephalic.  Mouth/Throat: Oropharynx is clear and moist.  Eyes: EOM are normal. Pupils are equal, round, and reactive to light.  Neck: Normal range of motion. Neck supple.  Cardiovascular: Normal rate, regular rhythm and normal heart sounds.   Pulmonary/Chest: Effort normal and breath sounds normal. No respiratory distress. She has no wheezes.  ? Mild L chest reproducible tenderness   Abdominal: Soft. Bowel sounds are normal. She exhibits no distension. There is no tenderness.  Musculoskeletal: Normal range of motion. She exhibits no edema or tenderness.  Neurological: She is  alert and oriented to person, place, and time. No cranial nerve deficit. Coordination normal.  Skin: Skin is warm.  Psychiatric: She has a normal mood and affect.  Nursing note and vitals reviewed.    ED Treatments / Results  Labs (all labs ordered are listed, but only abnormal results are displayed) Labs Reviewed  BASIC METABOLIC PANEL - Abnormal; Notable for the following:       Result Value   Potassium 3.1 (*)    Glucose, Bld 140 (*)    Calcium 8.6 (*)    All other components within normal limits  CBC  TROPONIN I  BRAIN NATRIURETIC PEPTIDE  TROPONIN I    EKG  EKG Interpretation  Date/Time:  Tuesday Jun 17 2016 07:09:19 EDT Ventricular Rate:  71 PR Interval:  160 QRS Duration: 98 QT Interval:  410 QTC Calculation: 445 R Axis:   -43 Text Interpretation:  Normal sinus rhythm Left axis deviation Low voltage QRS Abnormal ECG When compared with ECG of 25-Jun-2011 13:34, QRS axis Shifted left No significant change since last tracing Confirmed by Manjot Hinks  MD, Zenora Karpel ([id number]) on 06/17/2016 7:45:04 AM       Radiology Dg Chest 2 View  Result Date: 06/17/2016 CLINICAL DATA:  Left-sided chest pain EXAM: CHEST  2 VIEW COMPARISON:  None. FINDINGS: The heart size and mediastinal contours are within normal limits. Both lungs are clear. The visualized skeletal structures are unremarkable. IMPRESSION: No active cardiopulmonary disease. Electronically Signed   By: Inez Catalina M.D.   On: 06/17/2016 07:36    Procedures Procedures (including critical care time)  Medications Ordered in ED Medications  acetaminophen (TYLENOL) tablet 650 mg (650 mg Oral Given 06/17/16 0833)  potassium chloride SA (K-DUR,KLOR-CON) CR tablet 40 mEq (40 mEq Oral Given 06/17/16 6568)     Initial Impression / Assessment and Plan / ED Course  I have reviewed the triage vital signs and the nursing notes.  Pertinent labs & imaging results that were available during my care of the patient were reviewed by me and  considered in my medical decision making (see chart for details).     Nicole Cervantes is a 41 y.o. female here with chest pain. Pain free now, EKG unremarkable. Low suspicion for PE or dissection. Low risk for ACS. Will get labs, trop x 2.   11:49 AM Trop neg x 2. BNP nl (had some leg swelling and subjective shortness of breath). Pain free now. Will dc home. Recommend stress test outpatient if she has chest pain.   Final Clinical Impressions(s) / ED Diagnoses   Final diagnoses:  None    New Prescriptions New Prescriptions   No medications on  file     Drenda Freeze, MD 06/17/16 1150

## 2016-07-11 DIAGNOSIS — J0100 Acute maxillary sinusitis, unspecified: Secondary | ICD-10-CM | POA: Diagnosis not present

## 2016-07-11 DIAGNOSIS — Z6837 Body mass index (BMI) 37.0-37.9, adult: Secondary | ICD-10-CM | POA: Diagnosis not present

## 2016-07-11 DIAGNOSIS — R197 Diarrhea, unspecified: Secondary | ICD-10-CM | POA: Diagnosis not present

## 2016-07-11 DIAGNOSIS — J40 Bronchitis, not specified as acute or chronic: Secondary | ICD-10-CM | POA: Diagnosis not present

## 2016-07-15 DIAGNOSIS — M5440 Lumbago with sciatica, unspecified side: Secondary | ICD-10-CM | POA: Diagnosis not present

## 2016-07-15 DIAGNOSIS — M5126 Other intervertebral disc displacement, lumbar region: Secondary | ICD-10-CM | POA: Diagnosis not present

## 2016-07-15 DIAGNOSIS — M5412 Radiculopathy, cervical region: Secondary | ICD-10-CM | POA: Diagnosis not present

## 2016-08-26 ENCOUNTER — Ambulatory Visit (INDEPENDENT_AMBULATORY_CARE_PROVIDER_SITE_OTHER): Payer: BLUE CROSS/BLUE SHIELD | Admitting: Obstetrics and Gynecology

## 2016-08-26 ENCOUNTER — Encounter: Payer: Self-pay | Admitting: Obstetrics and Gynecology

## 2016-08-26 VITALS — BP 124/78 | Ht 65.0 in | Wt 224.0 lb

## 2016-08-26 DIAGNOSIS — N898 Other specified noninflammatory disorders of vagina: Secondary | ICD-10-CM

## 2016-08-26 DIAGNOSIS — N761 Subacute and chronic vaginitis: Secondary | ICD-10-CM | POA: Insufficient documentation

## 2016-08-26 LAB — POCT WET PREP WITH KOH
Clue Cells Wet Prep HPF POC: NEGATIVE
KOH PREP POC: NEGATIVE
Trichomonas, UA: NEGATIVE
Yeast Wet Prep HPF POC: NEGATIVE

## 2016-08-26 MED ORDER — CLOTRIMAZOLE-BETAMETHASONE 1-0.05 % EX CREA: 1.0000 "application " | TOPICAL_CREAM | Freq: Two times a day (BID) | CUTANEOUS | 0 refills | Status: DC

## 2016-08-26 NOTE — Progress Notes (Signed)
Chief Complaint  Patient presents with  . Vaginitis  . Pelvic Pain    HPI:      [name] is a 41 y.o. G1P1001 who LMP was No LMP recorded. Patient has had a hysterectomy., presents today for vaginal irritation, increased d/c, and mild odor since last wk. She has tried vagisil ext for itch relief. She denies any recent abx use.   She has a long hx of chronic vag irritation, with neg bx with Dr. Laurey Morale a few yrs ago. Pt will scratch till she bleeds. Pt uses dial soap, no dryer sheets.   She is s/p hyst. She is sex active with husband.    Patient Active Problem List   Diagnosis Date Noted  . Subacute vaginitis 08/26/2016  . Suicidal ideation 10/25/2015  . Severe episode of recurrent major depressive disorder, without psychotic features ([place]) 10/24/2015  . Hypertension 10/23/2015  . Back pain 10/23/2015   Past Surgical History:  Procedure Laterality Date  . ABDOMINAL HYSTERECTOMY  05/2007   IN [place], [place]  . COLONOSCOPY  2006   DX  . Cyst removal to lower back    . LAPAROSCOPY  2000   BURNED HOLES IN OVARIES TO RELEASE PRESSURE FROM CYSTS.  DR. BURNS, [place] CYST REMOVAL    . SKIN BIOPSY    . TONSILLECTOMY       Family History  Problem Relation Age of Onset  . Diabetes Mother   . Hypertension Mother   . Depression Mother   . Stroke Mother   . Bladder Cancer Father   . Prostate cancer Father 63  . Diabetes Father   . Hypertension Father   . Bipolar disorder Sister   . Cancer Sister 38       BLADDER  . Heart disease Maternal Grandmother   . Depression Maternal Grandmother   . Depression Maternal Grandfather   . Bipolar disorder Sister   . Bipolar disorder Other   . Depression Maternal Aunt   . Depression Maternal Uncle     Social History   Social History  . Marital status: Married    Spouse name: N/A  . Number of children: 1  . Years of education: 37   Occupational History  . Not on file.   Social  History Main Topics  . Smoking status: Former Research scientist (life sciences)  . Smokeless tobacco: Never Used     Comment: QUIT 2008  . Alcohol use Yes     Comment: [place]  . Drug use: No  . Sexual activity: Yes    Birth control/ protection: Surgical   Other Topics Concern  . Not on file   Social History Narrative  . No narrative on file     Current Outpatient Prescriptions:  .  amLODipine (NORVASC) 10 MG tablet, Take 10 mg by mouth daily., Disp: , Rfl:  .  hydrochlorothiazide (HYDRODIURIL) 25 MG tablet, Take 25 mg by mouth daily., Disp: , Rfl:  .  PARoxetine (PAXIL) 40 MG tablet, Take 1 tablet (40 mg total) by mouth every morning., Disp: 30 tablet, Rfl: 1 .  Potassium 75 MG TABS, Take by mouth., Disp: , Rfl:  .  traZODone (DESYREL) 100 MG tablet, Take 1 tablet (100 mg total) by mouth at bedtime as needed for sleep., Disp: 30 tablet, Rfl: 1 .  clotrimazole-betamethasone (LOTRISONE) cream, Apply 1 application topically 2 (two) times daily. Apply externally BID prn sx up to 2 wks, Disp: 15 g,  Rfl: 0 .  lamoTRIgine (LAMICTAL) 25 MG tablet, Take 25 mg by mouth daily. Take 1 tablet daily for 2 weeks than 2 tablets daily for 2 weeks, Disp: , Rfl:  .  lamoTRIgine (LAMICTAL) 25 MG tablet, Take 2 tablets (50 mg total) by mouth at bedtime. Take 4 tabs at bedtime for 2 weeks beginning on 11/13/2015, 8 tabs or 200 mg after. (Patient not taking: Reported on 08/26/2016), Disp: 360 tablet, Rfl: 1 .  meloxicam (MOBIC) 15 MG tablet, Take 1 tablet (15 mg total) by mouth daily. (Patient not taking: Reported on 08/26/2016), Disp: 30 tablet, Rfl: 0  Review of Systems  Constitutional: Negative for fever.  Gastrointestinal: Negative for blood in stool, constipation, diarrhea, nausea and vomiting.  Genitourinary: Positive for vaginal discharge. Negative for dyspareunia, dysuria, flank pain, frequency, hematuria, urgency, vaginal bleeding and vaginal pain.  Musculoskeletal: Negative for back pain.  Skin: Negative for rash.      OBJECTIVE:   Vitals:  BP 124/78   Ht 5\' 5"  (1.651 m)   Wt 224 lb (101.6 kg)   BMI 37.28 kg/m   Physical Exam  Constitutional: She is oriented to person, place, and time and well-developed, well-nourished, and in no distress. Vital signs are normal.  Genitourinary: Vagina normal, right adnexa normal, left adnexa normal and vulva normal. Right adnexum displays no mass and no tenderness. Left adnexum displays no mass and no tenderness. Vulva exhibits no erythema, no exudate, no lesion, no rash and no tenderness. Vagina exhibits no lesion.  Genitourinary Comments: UTERUS/CX SURG ABSENT  Neurological: She is oriented to person, place, and time.  Vitals reviewed.   Results: Results for orders placed or performed in visit on 08/26/16 (from the past 24 hour(s))  POCT Wet Prep with KOH     Status: Normal   Collection Time: 08/26/16  5:10 PM  Result Value Ref Range   Trichomonas, UA Negative    Clue Cells Wet Prep HPF POC neg    Epithelial Wet Prep HPF POC  Few, Moderate, Many, Too numerous to count   Yeast Wet Prep HPF POC neg    Bacteria Wet Prep HPF POC  Few   RBC Wet Prep HPF POC     WBC Wet Prep HPF POC     KOH Prep POC Negative Negative     Assessment/Plan: Subacute vaginitis - Neg exam/wet prep. Rx lotrisone crm. Question chem etiology with dial soap. Use dove sens skin soap. Cold compresses to stop itch/scratch. F/u prn sx.  - Plan: clotrimazole-betamethasone (LOTRISONE) cream, POCT Wet Prep with KOH  Vaginal discharge - Plan: clotrimazole-betamethasone (LOTRISONE) cream, POCT Wet Prep with KOH    Return if symptoms worsen or fail to improve.  Alicia B. Copland, PA-C 08/26/2016 5:12 PM

## 2016-09-03 DIAGNOSIS — H66004 Acute suppurative otitis media without spontaneous rupture of ear drum, recurrent, right ear: Secondary | ICD-10-CM | POA: Diagnosis not present

## 2016-09-03 DIAGNOSIS — R42 Dizziness and giddiness: Secondary | ICD-10-CM | POA: Diagnosis not present

## 2016-09-03 DIAGNOSIS — Z6837 Body mass index (BMI) 37.0-37.9, adult: Secondary | ICD-10-CM | POA: Diagnosis not present

## 2016-09-12 DIAGNOSIS — M5416 Radiculopathy, lumbar region: Secondary | ICD-10-CM | POA: Diagnosis not present

## 2016-09-12 DIAGNOSIS — M5136 Other intervertebral disc degeneration, lumbar region: Secondary | ICD-10-CM | POA: Diagnosis not present

## 2016-10-24 DIAGNOSIS — Z6839 Body mass index (BMI) 39.0-39.9, adult: Secondary | ICD-10-CM | POA: Diagnosis not present

## 2016-10-24 DIAGNOSIS — H6692 Otitis media, unspecified, left ear: Secondary | ICD-10-CM | POA: Diagnosis not present

## 2016-10-24 DIAGNOSIS — H6983 Other specified disorders of Eustachian tube, bilateral: Secondary | ICD-10-CM | POA: Diagnosis not present

## 2016-11-25 DIAGNOSIS — H6983 Other specified disorders of Eustachian tube, bilateral: Secondary | ICD-10-CM | POA: Diagnosis not present

## 2016-11-25 DIAGNOSIS — L309 Dermatitis, unspecified: Secondary | ICD-10-CM | POA: Diagnosis not present

## 2016-11-25 DIAGNOSIS — Z8669 Personal history of other diseases of the nervous system and sense organs: Secondary | ICD-10-CM | POA: Diagnosis not present

## 2016-11-25 DIAGNOSIS — J321 Chronic frontal sinusitis: Secondary | ICD-10-CM | POA: Diagnosis not present

## 2017-01-15 DIAGNOSIS — G5761 Lesion of plantar nerve, right lower limb: Secondary | ICD-10-CM | POA: Diagnosis not present

## 2017-03-16 ENCOUNTER — Ambulatory Visit: Payer: BLUE CROSS/BLUE SHIELD | Admitting: Obstetrics and Gynecology

## 2017-03-16 ENCOUNTER — Ambulatory Visit (INDEPENDENT_AMBULATORY_CARE_PROVIDER_SITE_OTHER): Payer: BLUE CROSS/BLUE SHIELD | Admitting: Obstetrics and Gynecology

## 2017-03-16 ENCOUNTER — Encounter: Payer: Self-pay | Admitting: Obstetrics and Gynecology

## 2017-03-16 VITALS — BP 120/80 | HR 85 | Ht 64.0 in | Wt 233.0 lb

## 2017-03-16 DIAGNOSIS — R350 Frequency of micturition: Secondary | ICD-10-CM | POA: Diagnosis not present

## 2017-03-16 DIAGNOSIS — R102 Pelvic and perineal pain: Secondary | ICD-10-CM

## 2017-03-16 LAB — POCT URINALYSIS DIPSTICK
Bilirubin, UA: NEGATIVE
Glucose, UA: NEGATIVE
Ketones, UA: NEGATIVE
LEUKOCYTES UA: NEGATIVE
NITRITE UA: NEGATIVE
PH UA: 5 (ref 5.0–8.0)
PROTEIN UA: NEGATIVE
RBC UA: NEGATIVE
Spec Grav, UA: 1.02 (ref 1.010–1.025)

## 2017-03-16 NOTE — Patient Instructions (Signed)
I value your feedback and entrusting us with your care. If you get a [facility] patient survey, I would appreciate you taking the time to let us know about your experience today. Thank you! 

## 2017-03-16 NOTE — Progress Notes (Signed)
Chief Complaint  Patient presents with  . Vaginal Pain    pressure with urination,    HPI:      [name] is a 42 y.o. G1P1001 who LMP was No LMP recorded. Patient has had a hysterectomy., presents today for suprapubic pressure yesterday that has turned into bilat pelvic pain today. Pt notes underlying constant dull pain, but has intermittent sharp pains BLQ, R>L. She has urinary frequncy with good flow, but drinks lots of water. No dysuria. Is having nocturia now which is unusual for pt. Denies any vag sx, GI sx, fevers. Has noted RT LBP for 3 days. Not taken any meds for sx. Hx of ovar cysts in past, although s/p TAH for leio. No vag bleeding. Had increased pain with sex last night.  Past Medical History:  Diagnosis Date  . Anxiety   . Back pain   . Bipolar 1 disorder ([place])   . Depression   . Hypertension    DR. GRANDIS - [facility]    Past Surgical History:  Procedure Laterality Date  . ABDOMINAL HYSTERECTOMY  05/2007   IN [place], [place]  . COLONOSCOPY  2006   DX  . Cyst removal to lower back    . LAPAROSCOPY  2000   BURNED HOLES IN OVARIES TO RELEASE PRESSURE FROM CYSTS.  DR. BURNS, [place] CYST REMOVAL    . SKIN BIOPSY    . TONSILLECTOMY      Family History  Problem Relation Age of Onset  . Diabetes Mother   . Hypertension Mother   . Depression Mother   . Stroke Mother   . Bladder Cancer Father   . Prostate cancer Father 39  . Diabetes Father   . Hypertension Father   . Bipolar disorder Sister   . Cancer Sister 53       BLADDER  . Heart disease Maternal Grandmother   . Depression Maternal Grandmother   . Depression Maternal Grandfather   . Bipolar disorder Sister   . Bipolar disorder Other   . Depression Maternal Aunt   . Depression Maternal Uncle     Social History   Socioeconomic History  . Marital status: Married    Spouse name: Not on file  . Number of children: 1  . Years of education: 47    . Highest education level: Not on file  Social Needs  . Financial resource strain: Not on file  . Food insecurity - worry: Not on file  . Food insecurity - inability: Not on file  . Transportation needs - medical: Not on file  . Transportation needs - non-medical: Not on file  Occupational History  . Not on file  Tobacco Use  . Smoking status: Former Research scientist (life sciences)  . Smokeless tobacco: Never Used  . Tobacco comment: QUIT 2008  Substance and Sexual Activity  . Alcohol use: Yes    Comment: [place]  . Drug use: No  . Sexual activity: Yes    Birth control/protection: Surgical  Other Topics Concern  . Not on file  Social History Narrative  . Not on file     Current Outpatient Medications:  .  amLODipine (NORVASC) 10 MG tablet, Take 10 mg by mouth daily., Disp: , Rfl:  .  clotrimazole-betamethasone (LOTRISONE) cream, Apply 1 application topically 2 (two) times daily. Apply externally BID prn sx up to 2 wks, Disp: 15 g, Rfl: 0 .  hydrochlorothiazide (HYDRODIURIL) 25 MG tablet, Take  25 mg by mouth daily., Disp: , Rfl:  .  meloxicam (MOBIC) 15 MG tablet, Take 1 tablet (15 mg total) by mouth daily., Disp: 30 tablet, Rfl: 0 .  PARoxetine (PAXIL) 40 MG tablet, Take 1 tablet (40 mg total) by mouth every morning., Disp: 30 tablet, Rfl: 1 .  Potassium 75 MG TABS, Take by mouth., Disp: , Rfl:  .  traZODone (DESYREL) 100 MG tablet, Take 1 tablet (100 mg total) by mouth at bedtime as needed for sleep., Disp: 30 tablet, Rfl: 1 .  lamoTRIgine (LAMICTAL) 25 MG tablet, Take 25 mg by mouth daily. Take 1 tablet daily for 2 weeks than 2 tablets daily for 2 weeks, Disp: , Rfl:  .  lamoTRIgine (LAMICTAL) 25 MG tablet, Take 2 tablets (50 mg total) by mouth at bedtime. Take 4 tabs at bedtime for 2 weeks beginning on 11/13/2015, 8 tabs or 200 mg after. (Patient not taking: Reported on 08/26/2016), Disp: 360 tablet, Rfl: 1   ROS:  Review of Systems  Constitutional: Negative for fever.  Gastrointestinal: Negative  for blood in stool, constipation, diarrhea, nausea and vomiting.  Genitourinary: Positive for dyspareunia, frequency and pelvic pain. Negative for dysuria, flank pain, hematuria, urgency, vaginal bleeding, vaginal discharge and vaginal pain.  Musculoskeletal: Positive for back pain.  Skin: Negative for rash.     OBJECTIVE:   Vitals:  BP 120/80   Pulse 85   Ht 5\' 4"  (1.626 m)   Wt 233 lb (105.7 kg)   BMI 39.99 kg/m   Physical Exam  Constitutional: She is oriented to person, place, and time and well-developed, well-nourished, and in no distress. Vital signs are normal.  Abdominal: Soft. Normal appearance. There is tenderness in the right lower quadrant. There is no rigidity and no guarding.  Genitourinary: Vagina normal, right adnexa normal and vulva normal. Right adnexum displays no mass and no tenderness. Left adnexum displays tenderness. Left adnexum displays no mass. Vulva exhibits no erythema, no exudate, no lesion, no rash and no tenderness. Vagina exhibits no lesion.  Musculoskeletal:       Lumbar back: She exhibits no tenderness, no edema and no pain.  Neurological: She is oriented to person, place, and time.  Vitals reviewed.   Results: Results for orders placed or performed in visit on 03/16/17 (from the past 24 hour(s))  POCT Urinalysis Dipstick     Status: Normal   Collection Time: 03/16/17  4:45 PM  Result Value Ref Range   Color, UA yellow    Clarity, UA clear    Glucose, UA neg    Bilirubin, UA neg    Ketones, UA neg    Spec Grav, UA 1.020 1.010 - 1.025   Blood, UA neg    pH, UA 5.0 5.0 - 8.0   Protein, UA neg    Urobilinogen, UA  0.2 or 1.0 E.U./dL   Nitrite, UA neg    Leukocytes, UA Negative Negative   Appearance     Odor       Assessment/Plan: Pelvic pain - RLQ pain is worse but exam tenderness in LLQ. Neg urine dip. Check C&S. Check GYN u/s. Will call with results. NSAIDs/heating pad. F/u sooner prn. - Plan: POCT Urinalysis Dipstick, Urine Culture,  US PELVIS TRANSVANGINAL NON-OB (TV ONLY)  Urinary frequency - Neg dip. Check C&S. - Plan: POCT Urinalysis Dipstick, Urine Culture    Return in about 1 day (around 03/17/2017) for GYN u/s for pelvic pain--[facility] to call pt.  Alicia B. Copland, PA-C 03/16/2017  4:48 PM

## 2017-03-17 ENCOUNTER — Ambulatory Visit (INDEPENDENT_AMBULATORY_CARE_PROVIDER_SITE_OTHER): Payer: BLUE CROSS/BLUE SHIELD

## 2017-03-17 ENCOUNTER — Telehealth: Payer: Self-pay | Admitting: Obstetrics and Gynecology

## 2017-03-17 DIAGNOSIS — N83209 Unspecified ovarian cyst, unspecified side: Secondary | ICD-10-CM | POA: Insufficient documentation

## 2017-03-17 DIAGNOSIS — N83202 Unspecified ovarian cyst, left side: Secondary | ICD-10-CM

## 2017-03-17 DIAGNOSIS — R102 Pelvic and perineal pain: Secondary | ICD-10-CM

## 2017-03-17 NOTE — Telephone Encounter (Signed)
Pt aware of u/s results. NSAIDs/heating pad. F/u prn. Rechk u/s in 8 wks with annual afterwards (last annual 3/17).   ULTRASOUND REPORT  Location: [facility]  Date of Service: 03/17/2017    Indications:Pelvic pain and History of ovarian cysts Findings:  S/P HYSTERECTOMY  Right Ovary measures 1.62 X 0.84 X 0.83 cm. It is normal in appearance. Left Ovary measures 5.32 X 3.91 X 2.80 cm. It contain avascular cystic area measures = 3.42 x 2.83 x 3.58 cm Survey of the adnexa demonstrates no adnexal masses. There is no free fluid in the cul de sac.  Impression: 1. Avascular cystic area in left ovary measures 3.42 x 2.83 x 3.58 cm  Recommendations: 1.Clinical correlation with the patient's History and Physical Exam.   Mital bahen Marlowe Sax, RDMS

## 2017-03-18 LAB — URINE CULTURE: ORGANISM ID, BACTERIA: NO GROWTH

## 2017-05-12 ENCOUNTER — Ambulatory Visit: Payer: BLUE CROSS/BLUE SHIELD | Admitting: Obstetrics and Gynecology

## 2017-05-12 ENCOUNTER — Other Ambulatory Visit: Payer: BLUE CROSS/BLUE SHIELD

## 2017-05-29 ENCOUNTER — Ambulatory Visit: Payer: BLUE CROSS/BLUE SHIELD | Admitting: Obstetrics and Gynecology

## 2017-06-03 IMAGING — CT CT ABD-PELV W/ CM
2 of 5 series · 16 of 46 positions shown, 18 images · IV contrast (APPLIED)
Comparison: 06/25/2011

CLINICAL DATA: Abdominal pain, hematochezia, left lower quadrant
pain.

EXAM:
CT ABDOMEN AND PELVIS WITH CONTRAST
TECHNIQUE: Multidetector CT imaging of the abdomen and pelvis was performed
using the standard protocol following bolus administration of
intravenous contrast.
CONTRAST:  100mL Z8LKDR-YCC IOPAMIDOL (Z8LKDR-YCC) INJECTION 61%

[Series 2: axial st · axial · 0.89mm/px · z∈[-914,-449]mm · 13 of 105 slices shown, 15 images]
[im 6/105  soft-tissue]
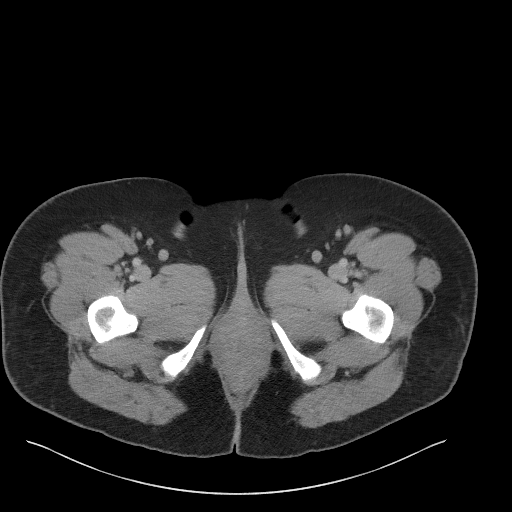
[im 6/105  bone]
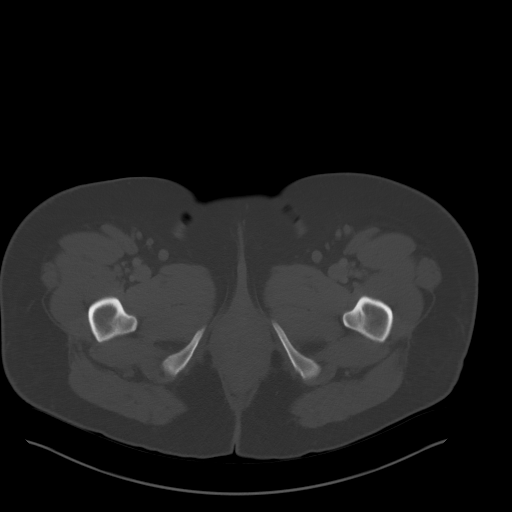
[im 17/105  soft-tissue]
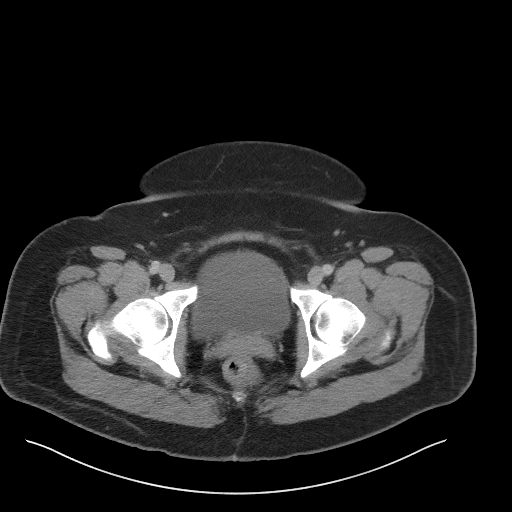
[im 22/105  soft-tissue]
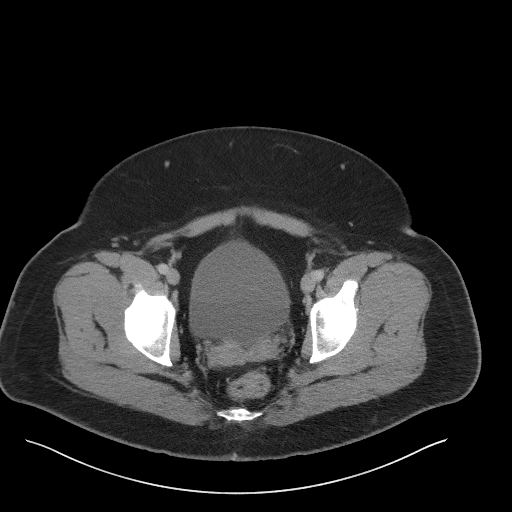
[im 28/105  soft-tissue]
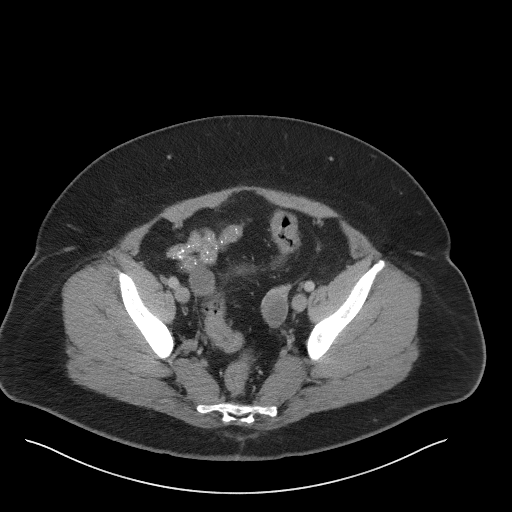
[im 39/105  soft-tissue]
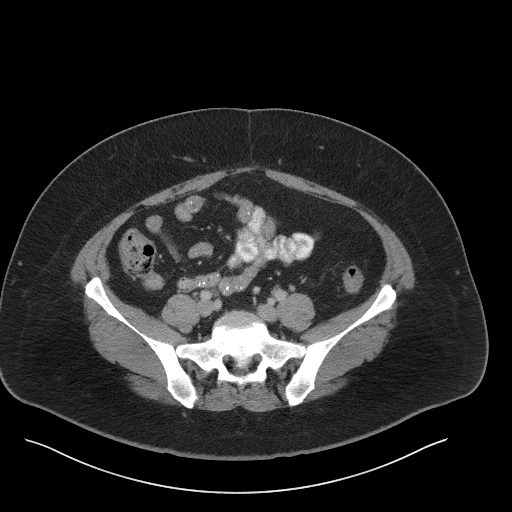
[im 44/105  soft-tissue]
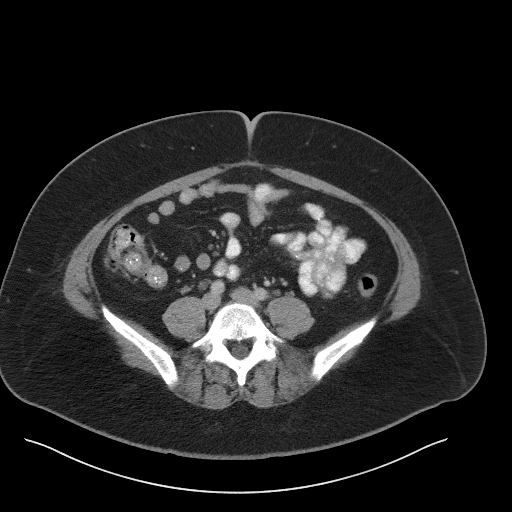
[im 55/105  soft-tissue]
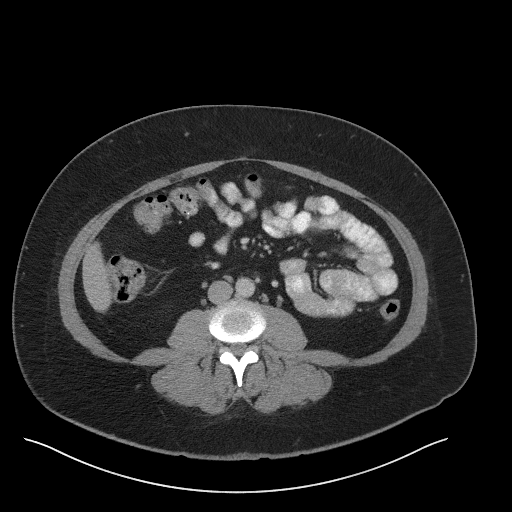
[im 61/105  soft-tissue]
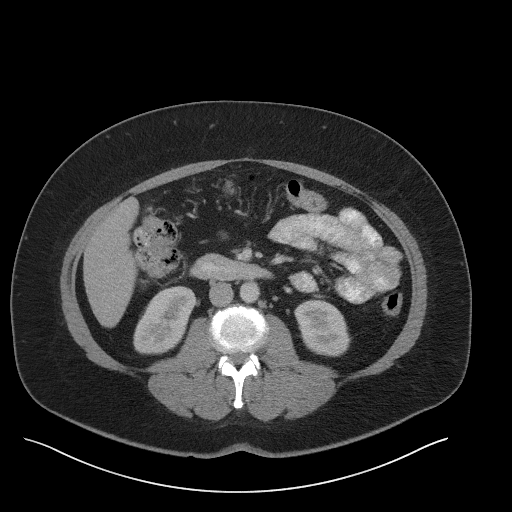
[im 66/105  soft-tissue]
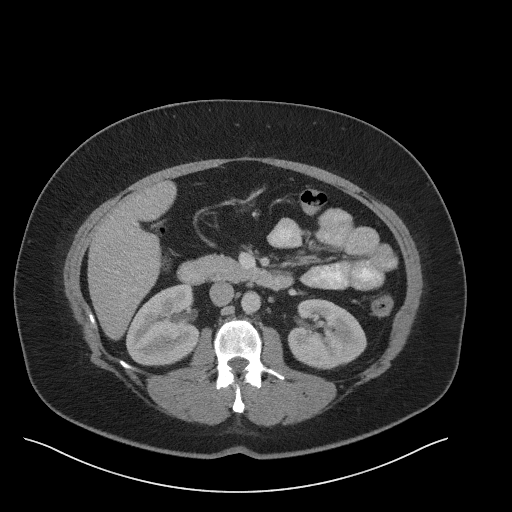
[im 66/105  bone]
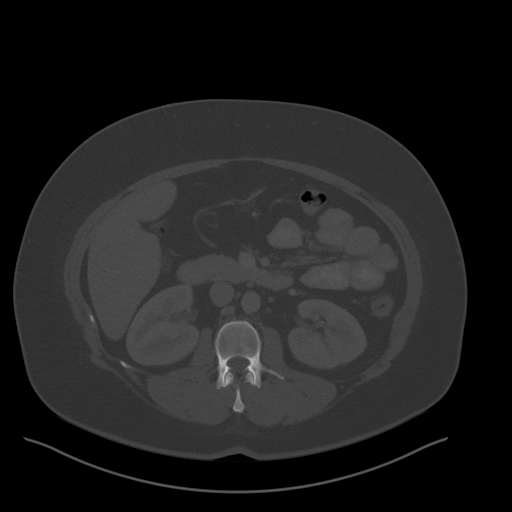
[im 77/105  soft-tissue]
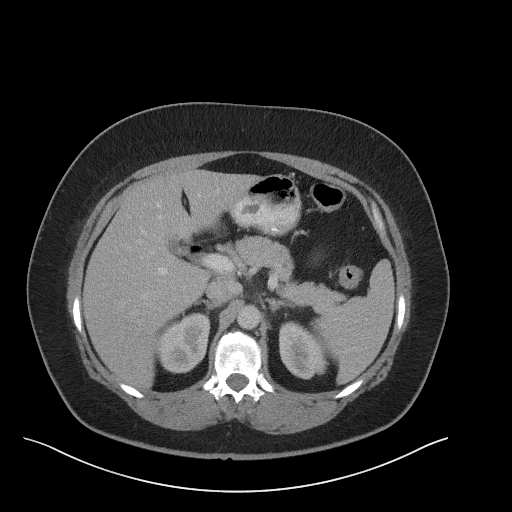
[im 83/105  soft-tissue]
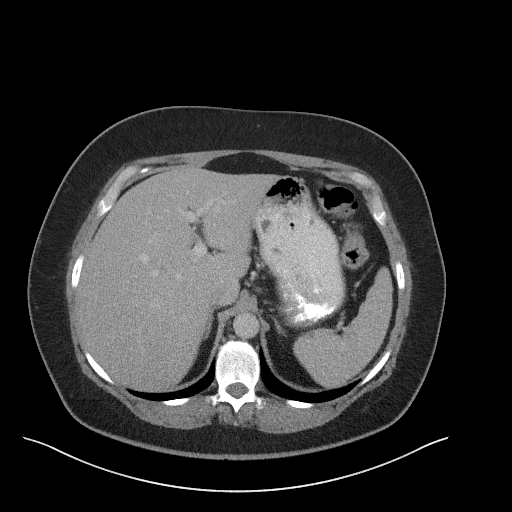
[im 88/105  soft-tissue]
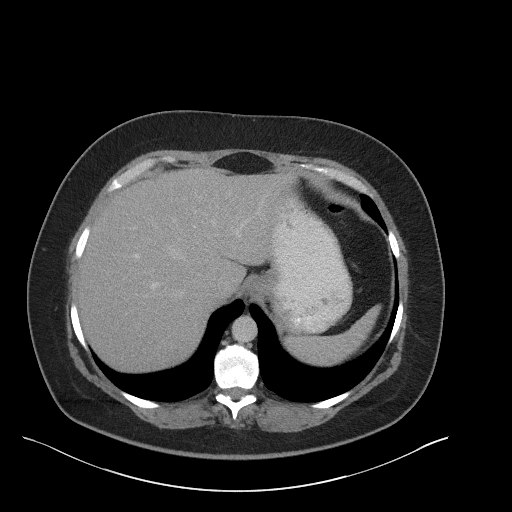
[im 99/105  soft-tissue]
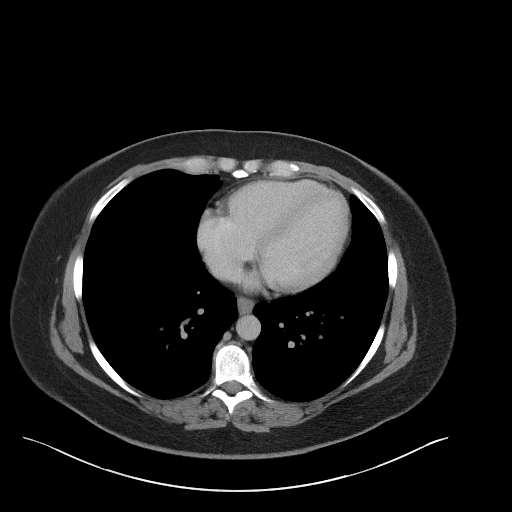

[Series 5: coronal st · coronal · 0.80mm/px · 3 of 106 slices shown]
[im 36/106  soft-tissue]
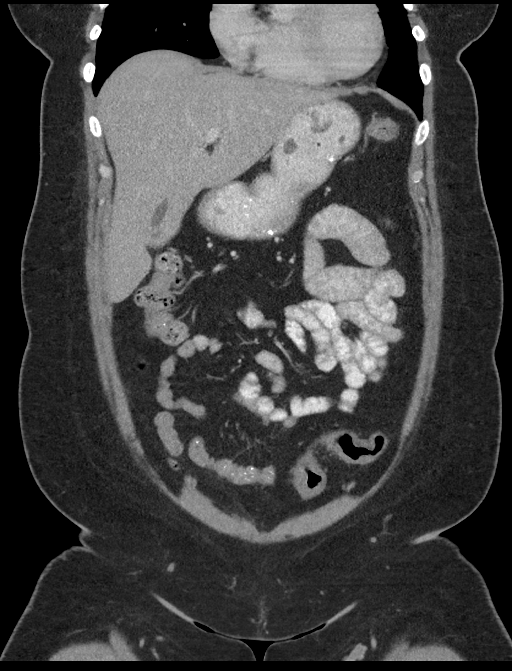
[im 47/106  soft-tissue]
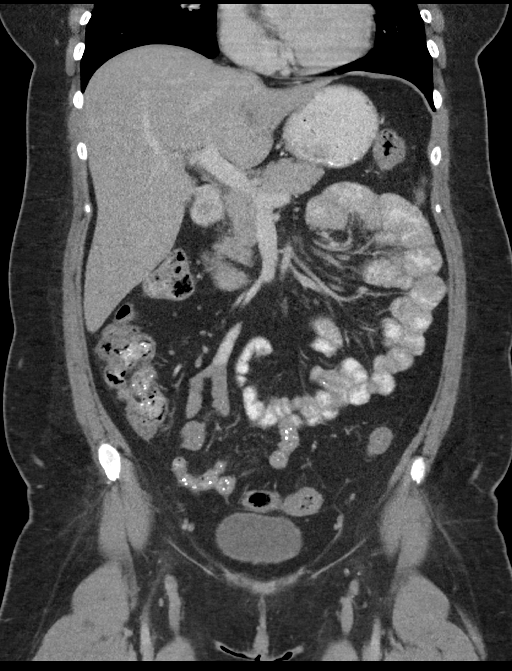
[im 59/106  soft-tissue]
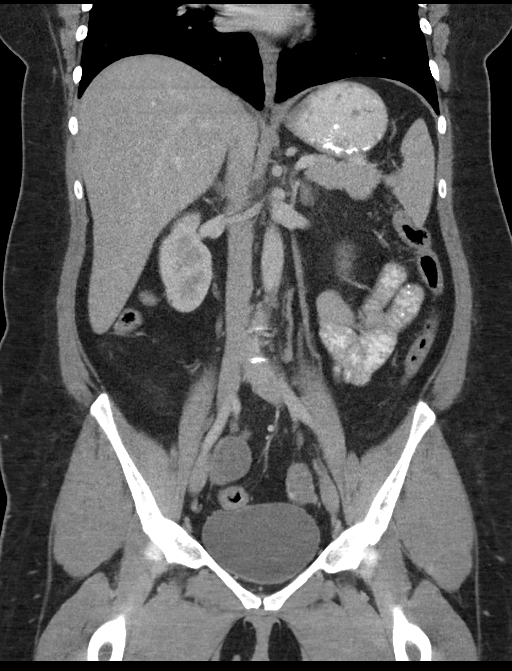

[16 of 46 positions shown; findings below may reference images not displayed]

FINDINGS: Lower chest: Lung bases are clear. No effusions. Heart is normal
size.

Hepatobiliary: Small hepatic cysts in the right hepatic lobe.
Gallbladder is contracted.

Pancreas: No focal abnormality or ductal dilatation.

Spleen: No focal abnormality.  Normal size.

Adrenals/Urinary Tract: No adrenal abnormality. No focal renal
abnormality. No stones or hydronephrosis. Urinary bladder is
unremarkable.

Stomach/Bowel: Appendix is normal. Stomach, large and small bowel
grossly unremarkable.

Vascular/Lymphatic: No evidence of aneurysm or adenopathy.

Reproductive: Prior hysterectomy. Small bilateral ovarian cysts, the
largest on the right measuring 3.5 cm.

Other: No free fluid or free air.

Musculoskeletal: No acute bony abnormality or focal bone lesion.
IMPRESSION: No acute findings in the abdomen or pelvis.

Small bilateral ovarian cysts, the largest on the right at 3.5 cm.

## 2017-07-06 ENCOUNTER — Encounter: Payer: Self-pay | Admitting: Obstetrics and Gynecology

## 2017-07-06 ENCOUNTER — Ambulatory Visit (INDEPENDENT_AMBULATORY_CARE_PROVIDER_SITE_OTHER): Payer: BLUE CROSS/BLUE SHIELD | Admitting: Obstetrics and Gynecology

## 2017-07-06 VITALS — BP 124/82 | HR 77 | Ht 64.0 in | Wt 249.0 lb

## 2017-07-06 DIAGNOSIS — N761 Subacute and chronic vaginitis: Secondary | ICD-10-CM | POA: Diagnosis not present

## 2017-07-06 DIAGNOSIS — Z01419 Encounter for gynecological examination (general) (routine) without abnormal findings: Secondary | ICD-10-CM

## 2017-07-06 DIAGNOSIS — Z1239 Encounter for other screening for malignant neoplasm of breast: Secondary | ICD-10-CM

## 2017-07-06 DIAGNOSIS — N83202 Unspecified ovarian cyst, left side: Secondary | ICD-10-CM

## 2017-07-06 DIAGNOSIS — Z1231 Encounter for screening mammogram for malignant neoplasm of breast: Secondary | ICD-10-CM | POA: Diagnosis not present

## 2017-07-06 MED ORDER — CLOTRIMAZOLE-BETAMETHASONE 1-0.05 % EX CREA: TOPICAL_CREAM | CUTANEOUS | 0 refills | Status: DC

## 2017-07-06 NOTE — Patient Instructions (Addendum)
I value your feedback and entrusting us with your care. If you get a [facility] patient survey, I would appreciate you taking the time to let us know about your experience today. Thank you!  [facility]: [phone]  [facility]: [phone]  

## 2017-07-06 NOTE — Progress Notes (Signed)
PCP:  [facility]   Chief Complaint  Patient presents with  . Gynecologic Exam     HPI:      [name] is a 42 y.o. G1P1001 who LMP was No LMP recorded. Patient has had a hysterectomy., presents today for her annual examination.  Her menses are absent due to hyst for leio in 2009. Dysmenorrhea none. She does not have intermenstrual bleeding.  Hx of pelvic pain 2/19 and noted to have LT avascular cyst 3.42 x 2.83 x 3.58 cm. Pt's sx resolved. Was due to have f/u u/s today but pt declines due to insurance cost and sx resolution.   Sex activity: single partner, contraception - status post hysterectomy. Has had worsening decreased libido, especially since off paxil.  Last Pap: April 26, 2015  Results were: no abnormalities /neg HPV DNA  Hx of STDs: none  Pt with occas vaginitis sx. Has been going on for yrs. Treats prn with lotrisone crm with sx relief. Instructed to stop dial soap but pt states it didn't make a difference so still uses it. Also uses dryer sheets.   Last mammogram: April 26, 2015  Results were: normal--routine follow-up in 12 months There is no FH of breast cancer. There is no FH of ovarian cancer. The patient does do self-breast exams.  Tobacco use: The patient denies current or previous tobacco use. Alcohol use: none No drug use.  Exercise: not active currently due to LE edema and pain. Has appt with PCP this wk.   She does get adequate calcium but not Vitamin D in her diet.  Hx of depression and treated with paxil. Rx ran out about 2 months ago and pt can no longer see [facility] with insurance. Has appt with PCP this wk to restart it. She denies SI.   Labs with PCP.   Past Medical History:  Diagnosis Date  . Anxiety   . Back pain   . Bipolar 1 disorder ([place])   . Depression   . Hypertension    DR. GRANDIS - [facility]    Past Surgical History:  Procedure Laterality Date  . ABDOMINAL HYSTERECTOMY  05/2007   IN [place], [place]  . COLONOSCOPY  2006   DX  . Cyst removal to lower back    . LAPAROSCOPY  2000   BURNED HOLES IN OVARIES TO RELEASE PRESSURE FROM CYSTS.  DR. BURNS, [place] CYST REMOVAL    . SKIN BIOPSY    . TONSILLECTOMY      Family History  Problem Relation Age of Onset  . Diabetes Mother   . Hypertension Mother   . Depression Mother   . Stroke Mother   . Bladder Cancer Father   . Prostate cancer Father 60  . Diabetes Father   . Hypertension Father   . Bipolar disorder Sister   . Cancer Sister 80       BLADDER  . Heart disease Maternal Grandmother   . Depression Maternal Grandmother   . Depression Maternal Grandfather   . Bipolar disorder Sister   . Bipolar disorder Other   . Depression Maternal Aunt   . Depression Maternal Uncle     Social History   Socioeconomic History  . Marital status: Married    Spouse name: Not on file  . Number of children: 1  . Years of education: 67  . Highest education level: Not on file  Occupational History  .  Not on file  Social Needs  . Financial resource strain: Not on file  . Food insecurity:    Worry: Not on file    Inability: Not on file  . Transportation needs:    Medical: Not on file    Non-medical: Not on file  Tobacco Use  . Smoking status: Former Research scientist (life sciences)  . Smokeless tobacco: Never Used  . Tobacco comment: QUIT 2008  Substance and Sexual Activity  . Alcohol use: Yes    Comment: [place]  . Drug use: No  . Sexual activity: Yes    Birth control/protection: Surgical    Comment: Hysterectomy   Lifestyle  . Physical activity:    Days per week: Not on file    Minutes per session: Not on file  . Stress: Not on file  Relationships  . Social connections:    Talks on phone: Not on file    Gets together: Not on file    Attends religious service: Not on file    Active member of club or organization: Not on file    Attends meetings of clubs or organizations: Not on file    Relationship status: Not on  file  . Intimate partner violence:    Fear of current or ex partner: Not on file    Emotionally abused: Not on file    Physically abused: Not on file    Forced sexual activity: Not on file  Other Topics Concern  . Not on file  Social History Narrative  . Not on file    Outpatient Medications Prior to Visit  Medication Sig Dispense Refill  . amLODipine (NORVASC) 10 MG tablet Take 10 mg by mouth daily.    . ferrous sulfate 325 (65 FE) MG tablet Take by mouth.    . hydrochlorothiazide (HYDRODIURIL) 25 MG tablet Take 25 mg by mouth daily.    Marland Kitchen loratadine (CLARITIN) 10 MG tablet Take by mouth.    Marland Kitchen PARoxetine (PAXIL) 40 MG tablet Take 1 tablet (40 mg total) by mouth every morning. 30 tablet 1  . Potassium 75 MG TABS Take by mouth.    . potassium chloride (KCL) 2 mEq/mL SOLN oral liquid Take by mouth.    . clotrimazole-betamethasone (LOTRISONE) cream Apply 1 application topically 2 (two) times daily. Apply externally BID prn sx up to 2 wks (Patient not taking: Reported on 07/06/2017) 15 g 0  . lamoTRIgine (LAMICTAL) 25 MG tablet Take 25 mg by mouth daily. Take 1 tablet daily for 2 weeks than 2 tablets daily for 2 weeks    . lamoTRIgine (LAMICTAL) 25 MG tablet Take 2 tablets (50 mg total) by mouth at bedtime. Take 4 tabs at bedtime for 2 weeks beginning on 11/13/2015, 8 tabs or 200 mg after. (Patient not taking: Reported on 08/26/2016) 360 tablet 1  . meloxicam (MOBIC) 15 MG tablet Take 1 tablet (15 mg total) by mouth daily. (Patient not taking: Reported on 07/06/2017) 30 tablet 0  . traZODone (DESYREL) 100 MG tablet Take 1 tablet (100 mg total) by mouth at bedtime as needed for sleep. (Patient not taking: Reported on 07/06/2017) 30 tablet 1   No facility-administered medications prior to visit.       ROS:  Review of Systems  Constitutional: Negative for fatigue, fever and unexpected weight change.  Respiratory: Negative for cough, shortness of breath and wheezing.   Cardiovascular: Positive  for leg swelling. Negative for chest pain and palpitations.  Gastrointestinal: Negative for blood in stool, constipation, diarrhea, nausea and  vomiting.  Endocrine: Negative for cold intolerance, heat intolerance and polyuria.  Genitourinary: Negative for dyspareunia, dysuria, flank pain, frequency, genital sores, hematuria, menstrual problem, pelvic pain, urgency, vaginal bleeding, vaginal discharge and vaginal pain.  Musculoskeletal: Positive for arthralgias. Negative for back pain, joint swelling and myalgias.  Skin: Negative for rash.  Neurological: Negative for dizziness, syncope, light-headedness, numbness and headaches.  Hematological: Negative for adenopathy.  Psychiatric/Behavioral: Positive for dysphoric mood. Negative for agitation, confusion, self-injury, sleep disturbance and suicidal ideas. The patient is not nervous/anxious.    BREAST: No symptoms   Objective: BP 124/82   Pulse 77   Ht 5\' 4"  (1.626 m)   Wt 249 lb (112.9 kg)   BMI 42.74 kg/m    Physical Exam  Constitutional: She is oriented to person, place, and time. She appears well-developed and well-nourished.  Genitourinary: Vagina normal. There is no rash or tenderness on the right labia. There is no rash or tenderness on the left labia. No erythema or tenderness in the vagina. No vaginal discharge found. Right adnexum does not display mass and does not display tenderness. Left adnexum does not display mass and does not display tenderness.  Genitourinary Comments: UTERUS/CX SURG REM  Neck: Normal range of motion. No thyromegaly present.  Cardiovascular: Normal rate, regular rhythm and normal heart sounds.  No murmur heard. Pulmonary/Chest: Effort normal and breath sounds normal. Right breast exhibits no mass, no nipple discharge, no skin change and no tenderness. Left breast exhibits no mass, no nipple discharge, no skin change and no tenderness.  Abdominal: Soft. There is no tenderness. There is no guarding.    Musculoskeletal: Normal range of motion.  Neurological: She is alert and oriented to person, place, and time. No cranial nerve deficit.  Psychiatric: She has a normal mood and affect. Her behavior is normal.  Vitals reviewed.   Assessment/Plan: Encounter for annual routine gynecological examination  Screening for breast cancer - Pt to sched mammo. - Plan: MM DIGITAL SCREENING BILATERAL  Subacute vaginitis - Sx occur randomly. Line dry underwear. Rx RF lotrisone crm prn sx.  - Plan: clotrimazole-betamethasone (LOTRISONE) cream  Cyst of left ovary - Sx resolved and pt declines f/u u/s. F/u prn sx.   Meds ordered this encounter  Medications  . clotrimazole-betamethasone (LOTRISONE) cream    Sig: Apply externally BID prn sx up to 2 wks    Dispense:  15 g    Refill:  0    Order Specific Question:   Supervising Provider    Answer:   Gae Dry [[id number]]             GYN counsel breast self exam, mammography screening, menopause, adequate intake of calcium and vitamin D, diet and exercise     F/U  Return in about 1 year (around 07/07/2018).  Alicia B. Copland, PA-C 07/06/2017 4:37 PM

## 2017-07-08 DIAGNOSIS — R42 Dizziness and giddiness: Secondary | ICD-10-CM | POA: Diagnosis not present

## 2017-07-08 DIAGNOSIS — I10 Essential (primary) hypertension: Secondary | ICD-10-CM | POA: Diagnosis not present

## 2017-07-08 DIAGNOSIS — Z131 Encounter for screening for diabetes mellitus: Secondary | ICD-10-CM | POA: Diagnosis not present

## 2017-07-08 DIAGNOSIS — Z1329 Encounter for screening for other suspected endocrine disorder: Secondary | ICD-10-CM | POA: Diagnosis not present

## 2017-07-08 DIAGNOSIS — Z13220 Encounter for screening for lipoid disorders: Secondary | ICD-10-CM | POA: Diagnosis not present

## 2017-09-18 DIAGNOSIS — G5601 Carpal tunnel syndrome, right upper limb: Secondary | ICD-10-CM | POA: Diagnosis not present

## 2017-11-18 ENCOUNTER — Encounter: Payer: Self-pay | Admitting: Obstetrics and Gynecology

## 2017-11-18 ENCOUNTER — Ambulatory Visit (INDEPENDENT_AMBULATORY_CARE_PROVIDER_SITE_OTHER): Payer: BLUE CROSS/BLUE SHIELD | Admitting: Obstetrics and Gynecology

## 2017-11-18 VITALS — BP 118/80 | HR 80 | Ht 64.0 in | Wt 245.0 lb

## 2017-11-18 DIAGNOSIS — N898 Other specified noninflammatory disorders of vagina: Secondary | ICD-10-CM | POA: Diagnosis not present

## 2017-11-18 NOTE — Patient Instructions (Signed)
I value your feedback and entrusting us with your care. If you get a [facility] patient survey, I would appreciate you taking the time to let us know about your experience today. Thank you! 

## 2017-11-18 NOTE — Progress Notes (Signed)
[facility]   Chief Complaint  Patient presents with  . Vaginal bump    near the labia x 5 days, started with pain in area and today noticed a bump, feels pressure    HPI:      Nicole Cervantes is a 42 y.o. G1P1001 who LMP was No LMP recorded. Patient has had a hysterectomy., presents today for LT labial pain for the past 5 days that is getting worse, and then noticed a bump there today. Thought it was yeast vag and tried OTC meds for a few days without relief. No vag d/c, irritation, fishy odor otherwise. No fevers. No recent change in soaps prior to sx.  Last annual 6/19.  Past Medical History:  Diagnosis Date  . Anxiety   . Back pain   . Bipolar 1 disorder ([place])   . Depression   . Hypertension    DR. GRANDIS - [facility]    Past Surgical History:  Procedure Laterality Date  . ABDOMINAL HYSTERECTOMY  05/2007   IN [place], [place]  . COLONOSCOPY  2006   DX  . Cyst removal to lower back    . LAPAROSCOPY  2000   BURNED HOLES IN OVARIES TO RELEASE PRESSURE FROM CYSTS.  DR. BURNS, [place] CYST REMOVAL    . SKIN BIOPSY    . TONSILLECTOMY      Family History  Problem Relation Age of Onset  . Diabetes Mother   . Hypertension Mother   . Depression Mother   . Stroke Mother   . Bladder Cancer Father   . Prostate cancer Father 55  . Diabetes Father   . Hypertension Father   . Bipolar disorder Sister   . Cancer Sister 96       BLADDER  . Heart disease Maternal Grandmother   . Depression Maternal Grandmother   . Depression Maternal Grandfather   . Bipolar disorder Sister   . Bipolar disorder Other   . Depression Maternal Aunt   . Depression Maternal Uncle     Social History   Socioeconomic History  . Marital status: Married    Spouse name: Not on file  . Number of children: 1  . Years of education: 76  . Highest education level: Not on file  Occupational History  . Not on file  Social Needs  .  Financial resource strain: Not on file  . Food insecurity:    Worry: Not on file    Inability: Not on file  . Transportation needs:    Medical: Not on file    Non-medical: Not on file  Tobacco Use  . Smoking status: Former Research scientist (life sciences)  . Smokeless tobacco: Never Used  . Tobacco comment: QUIT 2008  Substance and Sexual Activity  . Alcohol use: Yes    Comment: [place]  . Drug use: No  . Sexual activity: Yes    Birth control/protection: Surgical    Comment: Hysterectomy   Lifestyle  . Physical activity:    Days per week: Not on file    Minutes per session: Not on file  . Stress: Not on file  Relationships  . Social connections:    Talks on phone: Not on file    Gets together: Not on file    Attends religious service: Not on file    Active member of club or organization: Not on file    Attends meetings of clubs or organizations: Not on  file    Relationship status: Not on file  . Intimate partner violence:    Fear of current or ex partner: Not on file    Emotionally abused: Not on file    Physically abused: Not on file    Forced sexual activity: Not on file  Other Topics Concern  . Not on file  Social History Narrative  . Not on file    Outpatient Medications Prior to Visit  Medication Sig Dispense Refill  . amLODipine (NORVASC) 10 MG tablet Take 10 mg by mouth daily.    . cholecalciferol (VITAMIN D) 1000 units tablet Take 1,000 Units by mouth daily.    . clotrimazole-betamethasone (LOTRISONE) cream Apply externally BID prn sx up to 2 wks 15 g 0  . ferrous sulfate 325 (65 FE) MG tablet Take by mouth.    . fluticasone (FLONASE) 50 MCG/ACT nasal spray Two sprays in each nostril once daily    . hydrochlorothiazide (HYDRODIURIL) 25 MG tablet Take 25 mg by mouth daily.    . meclizine (ANTIVERT) 25 MG tablet Take by mouth.    . meloxicam (MOBIC) 15 MG tablet Take by mouth.    Marland Kitchen PARoxetine (PAXIL) 20 MG tablet Take by mouth.    . Potassium 75 MG TABS Take by mouth.    . QUEtiapine  (SEROQUEL) 100 MG tablet Take by mouth.    . loratadine (CLARITIN) 10 MG tablet Take by mouth.    Marland Kitchen PARoxetine (PAXIL) 40 MG tablet Take 1 tablet (40 mg total) by mouth every morning. 30 tablet 1  . potassium chloride (KCL) 2 mEq/mL SOLN oral liquid Take by mouth.     No facility-administered medications prior to visit.       ROS:  Review of Systems  Constitutional: Negative for fever.  Gastrointestinal: Negative for blood in stool, constipation, diarrhea, nausea and vomiting.  Genitourinary: Positive for dyspareunia, genital sores and vaginal pain. Negative for dysuria, flank pain, frequency, hematuria, urgency, vaginal bleeding and vaginal discharge.  Musculoskeletal: Negative for back pain.  Skin: Negative for rash.   BREAST: No symptoms   OBJECTIVE:   Vitals:  BP 118/80   Pulse 80   Ht 5\' 4"  (1.626 m)   Wt 245 lb (111.1 kg)   BMI 42.05 kg/m   Physical Exam  Constitutional: She is oriented to person, place, and time. Vital signs are normal. She appears well-developed.  Pulmonary/Chest: Effort normal.  Genitourinary: Vagina normal and uterus normal.    There is no rash, tenderness or lesion on the right labia. There is no rash, tenderness or lesion on the left labia. Uterus is not enlarged and not tender. Cervix exhibits no motion tenderness. Right adnexum displays no mass and no tenderness. Left adnexum displays no mass and no tenderness. No erythema or tenderness in the vagina. No vaginal discharge found.  Musculoskeletal: Normal range of motion.  Neurological: She is alert and oriented to person, place, and time.  Psychiatric: She has a normal mood and affect. Her behavior is normal. Thought content normal.  Vitals reviewed.   Assessment/Plan: Vaginal lesion - Painful lesion LT labia minora, c/w blocked gland. Warm compresses/sitz baths. No need for abx. F/u prn.     Return if symptoms worsen or fail to improve.  Gracyn Santillanes B. Owais Pruett, PA-C 11/18/2017 9:59  AM

## 2018-09-06 DIAGNOSIS — G5600 Carpal tunnel syndrome, unspecified upper limb: Secondary | ICD-10-CM | POA: Insufficient documentation

## 2019-04-06 ENCOUNTER — Other Ambulatory Visit: Payer: Self-pay

## 2019-04-06 ENCOUNTER — Other Ambulatory Visit (HOSPITAL_COMMUNITY)
Admission: RE | Admit: 2019-04-06 | Discharge: 2019-04-06 | Disposition: A | Discharge status: Home / Self Care | Payer: BC Managed Care – PPO | Source: Ambulatory Visit | Attending: Advanced Practice Midwife | Admitting: Advanced Practice Midwife

## 2019-04-06 ENCOUNTER — Encounter: Payer: Self-pay | Admitting: Advanced Practice Midwife

## 2019-04-06 ENCOUNTER — Ambulatory Visit (INDEPENDENT_AMBULATORY_CARE_PROVIDER_SITE_OTHER): Payer: BC Managed Care – PPO | Admitting: Advanced Practice Midwife

## 2019-04-06 VITALS — BP 146/85 | HR 86 | Ht 64.0 in | Wt 251.0 lb

## 2019-04-06 DIAGNOSIS — N898 Other specified noninflammatory disorders of vagina: Secondary | ICD-10-CM | POA: Diagnosis not present

## 2019-04-06 DIAGNOSIS — Z90710 Acquired absence of both cervix and uterus: Secondary | ICD-10-CM

## 2019-04-06 DIAGNOSIS — Z01419 Encounter for gynecological examination (general) (routine) without abnormal findings: Secondary | ICD-10-CM

## 2019-04-06 DIAGNOSIS — Z1211 Encounter for screening for malignant neoplasm of colon: Secondary | ICD-10-CM

## 2019-04-06 DIAGNOSIS — Z1239 Encounter for other screening for malignant neoplasm of breast: Secondary | ICD-10-CM

## 2019-04-06 DIAGNOSIS — N761 Subacute and chronic vaginitis: Secondary | ICD-10-CM | POA: Diagnosis not present

## 2019-04-06 MED ORDER — CLOTRIMAZOLE-BETAMETHASONE 1-0.05 % EX CREA: TOPICAL_CREAM | CUTANEOUS | 1 refills | Status: DC

## 2019-04-06 NOTE — Progress Notes (Signed)
Gynecology Annual Exam  PCP: [facility]  Chief Complaint:  Chief Complaint  Patient presents with  . Gynecologic Exam  . Vaginitis    vaginal itching w/odor no discharge    History of Present Illness: Patient is a 44 y.o. G1P1001 presents for annual exam. The patient has complaint today of vaginal itching that is ongoing for a number of years which occurs about 2 times per month. She uses Lotrisone cream as previously prescribed with good relief. She has a new complaint of vaginal odor that she describes as sour for the past 2 weeks. She denies itching (currently) or irritation or discharge. She mentions decreased libido which was a concern last year also. We discussed possible reasons: natural course of aging (menopause), medications, stress/anxiety. She is still taking Paxil which does have decreased libido as a possible side effect. She also mentions that she has no concerns with ovaries per her history of cysts.   LMP: No LMP recorded. Patient has had a hysterectomy.  Postcoital Bleeding: no Dysmenorrhea: not applicable   The patient is sexually active. She currently uses status post hysterectomy for contraception. She denies dyspareunia.  The patient does perform self breast exams.  There is no notable family history of breast or ovarian cancer in her family.  The patient wears seatbelts: yes.   The patient has regular exercise: she denies regular physical activity. Her diet is primarily meats and starches. She does admit adequate hydration and sleep and a good support system.    The patient denies current symptoms of depression.  Her current medications are controlling her anxiety/depression.  Review of Systems: Review of Systems  Constitutional: Negative.   HENT: Negative.   Eyes: Negative.   Respiratory: Negative.   Cardiovascular: Negative.   Gastrointestinal: Negative.   Genitourinary:       Vaginal odor  Musculoskeletal: Negative.   Skin: Negative.     Neurological: Negative.   Endo/Heme/Allergies: Negative.   Psychiatric/Behavioral: Negative.     Past Medical History:  Past Medical History:  Diagnosis Date  . Anxiety   . Back pain   . Bipolar 1 disorder ([place])   . Depression   . Hypertension    DR. Winston    Past Surgical History:  Past Surgical History:  Procedure Laterality Date  . ABDOMINAL HYSTERECTOMY  05/2007   IN [place], [place] - TOTAL EXCEPT OVARIES, LEIOMYOMA  . COLONOSCOPY  2006   DX  . Cyst removal to lower back    . LAPAROSCOPY  2000   BURNED HOLES IN OVARIES TO RELEASE PRESSURE FROM CYSTS.  DR. BURNS, [place] CYST REMOVAL    . SKIN BIOPSY    . TONSILLECTOMY      Gynecologic History:  No LMP recorded. Patient has had a hysterectomy. Contraception: status post hysterectomy Last Pap: 2009 normal Last mammogram: 2017 Results were: BI-RAD I per patient report  Obstetric History: G1P1001  Family History:  Family History  Problem Relation Age of Onset  . Diabetes Mother   . Hypertension Mother   . Depression Mother   . Stroke Mother   . Bladder Cancer Father   . Prostate cancer Father 64  . Diabetes Father   . Hypertension Father   . Bipolar disorder Sister   . Cancer Sister 53       BLADDER  . Heart disease Maternal Grandmother   . Depression Maternal Grandmother   . Depression Maternal Grandfather   . Bipolar  disorder Sister   . Bipolar disorder Other   . Depression Maternal Aunt   . Depression Maternal Uncle     Social History:  Social History   Socioeconomic History  . Marital status: Married    Spouse name: Not on file  . Number of children: 1  . Years of education: 67  . Highest education level: Not on file  Occupational History  . Not on file  Tobacco Use  . Smoking status: Former Research scientist (life sciences)  . Smokeless tobacco: Never Used  . Tobacco comment: QUIT 2008  Substance and Sexual Activity  . Alcohol use: Yes    Comment: [place]  . Drug use: No  . Sexual  activity: Yes    Birth control/protection: Surgical    Comment: Hysterectomy   Other Topics Concern  . Not on file  Social History Narrative  . Not on file   Social Determinants of Health   Financial Resource Strain:   . Difficulty of Paying Living Expenses: Not on file  Food Insecurity:   . Worried About Charity fundraiser in the Last Year: Not on file  . Ran Out of Food in the Last Year: Not on file  Transportation Needs:   . Lack of Transportation (Medical): Not on file  . Lack of Transportation (Non-Medical): Not on file  Physical Activity:   . Days of Exercise per Week: Not on file  . Minutes of Exercise per Session: Not on file  Stress:   . Feeling of Stress : Not on file  Social Connections:   . Frequency of Communication with Friends and Family: Not on file  . Frequency of Social Gatherings with Friends and Family: Not on file  . Attends Religious Services: Not on file  . Active Member of Clubs or Organizations: Not on file  . Attends Archivist Meetings: Not on file  . Marital Status: Not on file  Intimate Partner Violence:   . Fear of Current or Ex-Partner: Not on file  . Emotionally Abused: Not on file  . Physically Abused: Not on file  . Sexually Abused: Not on file    Allergies:  No Known Allergies  Medications: Prior to Admission medications   Medication Sig Start Date End Date Taking? Authorizing Provider  amLODipine (NORVASC) 10 MG tablet Take 10 mg by mouth daily.   Yes [provider]  buPROPion (WELLBUTRIN) 100 MG tablet bupropion HCl 100 mg tablet 08/03/18  Yes [provider]  cholecalciferol (VITAMIN D) 1000 units tablet Take 1,000 Units by mouth daily.   Yes [provider]  clotrimazole-betamethasone (LOTRISONE) cream Apply externally BID prn sx up to 2 wks 04/06/19  Yes Rod Can, CNM  ferrous sulfate 325 (65 FE) MG tablet Take by mouth.   Yes [provider]  fluticasone (FLONASE) 50 MCG/ACT  nasal spray Two sprays in each nostril once daily 10/24/16  Yes [provider]  gabapentin (NEURONTIN) 300 MG capsule gabapentin 300 mg capsule 02/15/18  Yes [provider]  hydrochlorothiazide (HYDRODIURIL) 25 MG tablet Take 25 mg by mouth daily.   Yes [provider]  meclizine (ANTIVERT) 25 MG tablet Take by mouth. 07/08/17  Yes [provider]  PARoxetine (PAXIL) 20 MG tablet Take by mouth. 07/08/17  Yes [provider]  Potassium 75 MG TABS Take by mouth.   Yes [provider]  loratadine (CLARITIN) 10 MG tablet Take by mouth. 10/24/16 10/24/17  [provider]  QUEtiapine (SEROQUEL) 100 MG tablet Take  by mouth. 08/22/15   [provider]    Physical Exam Vitals: Blood pressure (!) 146/85, pulse 86, height 5\' 4"  (1.626 m), weight 251 lb (113.9 kg).  General: NAD HEENT: normocephalic, anicteric Thyroid: no enlargement, no palpable nodules Pulmonary: No increased work of breathing, CTAB Cardiovascular: RRR, distal pulses 2+ Breast: Breast symmetrical, no tenderness, no palpable nodules or masses, no skin or nipple retraction present, no nipple discharge.  No axillary or supraclavicular lymphadenopathy. Abdomen: NABS, soft, non-tender, non-distended.  Umbilicus without lesions.  No hepatomegaly, splenomegaly or masses palpable. No evidence of hernia  Genitourinary:  External: Normal external female genitalia.  Normal urethral meatus, normal Bartholin's and Skene's glands.    Vagina: Normal vaginal mucosa, no evidence of prolapse.    Cervix: no Cervix/hysterectomy  Uterus: no Uterus/hysterectomy  Adnexa: deferred  Rectal: deferred  Lymphatic: no evidence of inguinal lymphadenopathy Extremities: no edema, erythema, or tenderness Neurologic: Grossly intact Psychiatric: mood appropriate, affect full    Assessment: 44 y.o. G1P1001 routine annual exam  Plan: Problem List Items Addressed This Visit    None    Visit  Diagnoses    Well woman exam with routine gynecological exam    -  Primary   Relevant Orders   MM DIGITAL SCREENING BILATERAL   Subacute vaginitis       Sx occur randomly. Line dry underwear. Rx RF lotrisone crm prn sx.    Relevant Medications   clotrimazole-betamethasone (LOTRISONE) cream   Screen for colon cancer       Relevant Orders   Ambulatory referral to Gastroenterology   Encounter for screening for malignant neoplasm of breast, unspecified screening modality       Relevant Orders   MM DIGITAL SCREENING BILATERAL   Itching in the vaginal area       Relevant Orders   Cervicovaginal ancillary only   Vaginal odor       Relevant Orders   Cervicovaginal ancillary only      1) Mammogram - recommend yearly screening mammogram.  Mammogram Was ordered today  2) STI screening  was offered and declined  3) ASCCP guidelines and rationale discussed.  Patient opts for discontinue secondary to prior hysterectomy screening interval  4) Contraception - the patient is currently using  status post hysterectomy.  She is happy with her current form of contraception and plans to continue  5) Colonoscopy: referral sent today -- Screening recommended starting at age 24 for average risk individuals, age 40 for individuals deemed at increased risk (including African Americans) and recommended to continue until age 31.  For patient age 77-85 individualized approach is recommended.  Gold standard screening is via colonoscopy, Cologuard screening is an acceptable alternative for patient unwilling or unable to undergo colonoscopy.  "Colorectal cancer screening for average?risk adults: 2018 guideline update from the American Cancer Society"CA: A Cancer Journal for Clinicians: Jul 02, 2016   6) Routine healthcare maintenance including cholesterol, diabetes screening discussed managed by PCP  7) Return in about 1 year (around 04/05/2020) for annual established gyn.   Rod Can, [facility] 04/06/2019, 12:41 PM

## 2019-04-06 NOTE — Patient Instructions (Signed)
Health Maintenance, Female Adopting a healthy lifestyle and getting preventive care are important in promoting health and wellness. Ask your health care provider about:  The right schedule for you to have regular tests and exams.  Things you can do on your own to prevent diseases and keep yourself healthy. What should I know about diet, weight, and exercise? Eat a healthy diet   Eat a diet that includes plenty of vegetables, fruits, low-fat dairy products, and lean protein.  Do not eat a lot of foods that are high in solid fats, added sugars, or sodium. Maintain a healthy weight Body mass index (BMI) is used to identify weight problems. It estimates body fat based on height and weight. Your health care provider can help determine your BMI and help you achieve or maintain a healthy weight. Get regular exercise Get regular exercise. This is one of the most important things you can do for your health. Most adults should:  Exercise for at least 150 minutes each week. The exercise should increase your heart rate and make you sweat (moderate-intensity exercise).  Do strengthening exercises at least twice a week. This is in addition to the moderate-intensity exercise.  Spend less time sitting. Even light physical activity can be beneficial. Watch cholesterol and blood lipids Have your blood tested for lipids and cholesterol at 46 years of age, then have this test every 5 years. Have your cholesterol levels checked more often if:  Your lipid or cholesterol levels are high.  You are older than 46 years of age.  You are at high risk for heart disease. What should I know about cancer screening? Depending on your health history and family history, you may need to have cancer screening at various ages. This may include screening for:  Breast cancer.  Cervical cancer.  Colorectal cancer.  Skin cancer.  Lung cancer. What should I know about heart disease, diabetes, and high blood  pressure? Blood pressure and heart disease  High blood pressure causes heart disease and increases the risk of stroke. This is more likely to develop in people who have high blood pressure readings, are of African descent, or are overweight.  Have your blood pressure checked: ? Every 3-5 years if you are 18-39 years of age. ? Every year if you are 40 years old or older. Diabetes Have regular diabetes screenings. This checks your fasting blood sugar level. Have the screening done:  Once every three years after age 40 if you are at a normal weight and have a low risk for diabetes.  More often and at a younger age if you are overweight or have a high risk for diabetes. What should I know about preventing infection? Hepatitis B If you have a higher risk for hepatitis B, you should be screened for this virus. Talk with your health care provider to find out if you are at risk for hepatitis B infection. Hepatitis C Testing is recommended for:  Everyone born from 1945 through 1965.  Anyone with known risk factors for hepatitis C. Sexually transmitted infections (STIs)  Get screened for STIs, including gonorrhea and chlamydia, if: ? You are sexually active and are younger than 46 years of age. ? You are older than 46 years of age and your health care provider tells you that you are at risk for this type of infection. ? Your sexual activity has changed since you were last screened, and you are at increased risk for chlamydia or gonorrhea. Ask your health care provider if   you are at risk.  Ask your health care provider about whether you are at high risk for HIV. Your health care provider may recommend a prescription medicine to help prevent HIV infection. If you choose to take medicine to prevent HIV, you should first get tested for HIV. You should then be tested every 3 months for as long as you are taking the medicine. Pregnancy  If you are about to stop having your period (premenopausal) and  you may become pregnant, seek counseling before you get pregnant.  Take 400 to 800 micrograms (mcg) of folic acid every day if you become pregnant.  Ask for birth control (contraception) if you want to prevent pregnancy. Osteoporosis and menopause Osteoporosis is a disease in which the bones lose minerals and strength with aging. This can result in bone fractures. If you are 65 years old or older, or if you are at risk for osteoporosis and fractures, ask your health care provider if you should:  Be screened for bone loss.  Take a calcium or vitamin D supplement to lower your risk of fractures.  Be given hormone replacement therapy (HRT) to treat symptoms of menopause. Follow these instructions at home: Lifestyle  Do not use any products that contain nicotine or tobacco, such as cigarettes, e-cigarettes, and chewing tobacco. If you need help quitting, ask your health care provider.  Do not use street drugs.  Do not share needles.  Ask your health care provider for help if you need support or information about quitting drugs. Alcohol use  Do not drink alcohol if: ? Your health care provider tells you not to drink. ? You are pregnant, may be pregnant, or are planning to become pregnant.  If you drink alcohol: ? Limit how much you use to 0-1 drink a day. ? Limit intake if you are breastfeeding.  Be aware of how much alcohol is in your drink. In the [place] one drink equals one 12 oz bottle of beer (355 mL), one 5 oz glass of wine (148 mL), or one 1 oz glass of hard liquor (44 mL). General instructions  Schedule regular health, dental, and eye exams.  Stay current with your vaccines.  Tell your health care provider if: ? You often feel depressed. ? You have ever been abused or do not feel safe at home. Summary  Adopting a healthy lifestyle and getting preventive care are important in promoting health and wellness.  Follow your health care provider's instructions about healthy  diet, exercising, and getting tested or screened for diseases.  Follow your health care provider's instructions on monitoring your cholesterol and blood pressure. This information is not intended to replace advice given to you by your health care provider. Make sure you discuss any questions you have with your health care provider. Document Revised: 01/13/2018 Document Reviewed: 01/13/2018 Elsevier Patient Education  2020 Elsevier Inc.  

## 2019-04-07 LAB — CERVICOVAGINAL ANCILLARY ONLY
Bacterial Vaginitis (gardnerella): NEGATIVE
Candida Glabrata: NEGATIVE
Candida Vaginitis: NEGATIVE
Comment: NEGATIVE
Comment: NEGATIVE
Comment: NEGATIVE

## 2019-04-14 ENCOUNTER — Telehealth: Payer: Self-pay

## 2019-04-14 ENCOUNTER — Other Ambulatory Visit: Payer: Self-pay

## 2019-04-14 DIAGNOSIS — Z1211 Encounter for screening for malignant neoplasm of colon: Secondary | ICD-10-CM

## 2019-04-14 DIAGNOSIS — Z8371 Family history of colonic polyps: Secondary | ICD-10-CM

## 2019-04-14 NOTE — Telephone Encounter (Signed)
Gastroenterology Pre-Procedure Review  Request Date: Friday 05/20/19 Requesting Physician: Dr. Marius Ditch  PATIENT REVIEW QUESTIONS: The patient responded to the following health history questions as indicated:    1. Are you having any GI issues? no 2. Do you have a personal history of Polyps? no 3. Do you have a family history of Colon Cancer or Polyps? yes (mom and sister colon polyps) 4. Diabetes Mellitus? no 5. Joint replacements in the past 12 months?no 6. Major health problems in the past 3 months?no 7. Any artificial heart valves, MVP, or defibrillator?no    MEDICATIONS & ALLERGIES:    Patient reports the following regarding taking any anticoagulation/antiplatelet therapy:   Plavix, Coumadin, Eliquis, Xarelto, Lovenox, Pradaxa, Brilinta, or Effient? no Aspirin? no  Patient confirms/reports the following medications:  Current Outpatient Medications  Medication Sig Dispense Refill  . amLODipine (NORVASC) 10 MG tablet Take 10 mg by mouth daily.    Marland Kitchen buPROPion (WELLBUTRIN) 100 MG tablet bupropion HCl 100 mg tablet    . cholecalciferol (VITAMIN D) 1000 units tablet Take 1,000 Units by mouth daily.    . clotrimazole-betamethasone (LOTRISONE) cream Apply externally BID prn sx up to 2 wks 15 g 1  . ferrous sulfate 325 (65 FE) MG tablet Take by mouth.    . fluticasone (FLONASE) 50 MCG/ACT nasal spray Two sprays in each nostril once daily    . gabapentin (NEURONTIN) 300 MG capsule gabapentin 300 mg capsule    . hydrochlorothiazide (HYDRODIURIL) 25 MG tablet Take 25 mg by mouth daily.    Marland Kitchen loratadine (CLARITIN) 10 MG tablet Take by mouth.    . meclizine (ANTIVERT) 25 MG tablet Take by mouth.    Marland Kitchen PARoxetine (PAXIL) 20 MG tablet Take by mouth.    . Potassium 75 MG TABS Take by mouth.    . QUEtiapine (SEROQUEL) 100 MG tablet Take by mouth.     No current facility-administered medications for this visit.    Patient confirms/reports the following allergies:  No Known Allergies  No  orders of the defined types were placed in this encounter.   AUTHORIZATION INFORMATION Primary Insurance: 1D#: Group #:  Secondary Insurance: 1D#: Group #:  SCHEDULE INFORMATION: Date: Friday 05/20/19 Time: Location:[facility]

## 2019-05-18 ENCOUNTER — Other Ambulatory Visit
Admission: RE | Admit: 2019-05-18 | Discharge: 2019-05-18 | Disposition: A | Discharge status: Home / Self Care | Payer: BC Managed Care – PPO | Source: Ambulatory Visit | Attending: Gastroenterology | Admitting: Gastroenterology

## 2019-05-18 ENCOUNTER — Other Ambulatory Visit: Payer: Self-pay

## 2019-05-18 ENCOUNTER — Telehealth: Payer: Self-pay

## 2019-05-18 DIAGNOSIS — Z20822 Contact with and (suspected) exposure to covid-19: Secondary | ICD-10-CM | POA: Diagnosis not present

## 2019-05-18 DIAGNOSIS — Z01812 Encounter for preprocedural laboratory examination: Secondary | ICD-10-CM | POA: Insufficient documentation

## 2019-05-18 LAB — SARS CORONAVIRUS 2 (TAT 6-24 HRS): SARS Coronavirus 2: NEGATIVE

## 2019-05-18 NOTE — Telephone Encounter (Signed)
Patient is scheduled for a procedure on Fr. Worried the we haven't requested prior authorization from her insurance company. She just spoke with [employer] and they reminded her that authorization must be given prior to procedure. Patient would like to cancel procedure if facilty isn't able to gain prior auth.

## 2019-05-19 ENCOUNTER — Telehealth: Payer: Self-pay

## 2019-05-19 NOTE — Telephone Encounter (Signed)
Patient has been contacted to inform her the following message from Traci: [employer] An[employer] will not cover as she has to be 39 and older. To use the family hx of colon polyps we have to get the pathology reports from mother and sister.  She informed me that Traci had also contacted her to make her aware.  Thanks,  [place], [place]

## 2019-05-20 ENCOUNTER — Ambulatory Visit
Admission: RE | Admit: 2019-05-20 | Payer: BC Managed Care – PPO | Source: Home / Self Care | Admitting: Gastroenterology

## 2019-05-20 ENCOUNTER — Encounter: Admission: RE | Payer: Self-pay | Source: Home / Self Care

## 2019-05-20 SURGERY — COLONOSCOPY WITH PROPOFOL
Anesthesia: General

## 2020-03-03 DIAGNOSIS — U071 COVID-19: Secondary | ICD-10-CM | POA: Diagnosis not present

## 2020-08-07 ENCOUNTER — Encounter: Payer: Self-pay | Admitting: *Deleted

## 2020-08-07 ENCOUNTER — Emergency Department
Admission: EM | Admit: 2020-08-07 | Discharge: 2020-08-07 | Disposition: A | Discharge status: Home / Self Care | Payer: BC Managed Care – PPO | Attending: Emergency Medicine | Admitting: Emergency Medicine

## 2020-08-07 ENCOUNTER — Emergency Department: Payer: BC Managed Care – PPO

## 2020-08-07 DIAGNOSIS — Z87891 Personal history of nicotine dependence: Secondary | ICD-10-CM | POA: Insufficient documentation

## 2020-08-07 DIAGNOSIS — R109 Unspecified abdominal pain: Secondary | ICD-10-CM | POA: Diagnosis not present

## 2020-08-07 DIAGNOSIS — R1030 Lower abdominal pain, unspecified: Secondary | ICD-10-CM | POA: Diagnosis not present

## 2020-08-07 DIAGNOSIS — I10 Essential (primary) hypertension: Secondary | ICD-10-CM | POA: Insufficient documentation

## 2020-08-07 DIAGNOSIS — R110 Nausea: Secondary | ICD-10-CM | POA: Diagnosis not present

## 2020-08-07 DIAGNOSIS — R1033 Periumbilical pain: Secondary | ICD-10-CM | POA: Diagnosis not present

## 2020-08-07 DIAGNOSIS — Z79899 Other long term (current) drug therapy: Secondary | ICD-10-CM | POA: Insufficient documentation

## 2020-08-07 LAB — BASIC METABOLIC PANEL
Anion gap: 8 (ref 5–15)
BUN: 14 mg/dL (ref 6–20)
CO2: 26 mmol/L (ref 22–32)
Calcium: 9.1 mg/dL (ref 8.9–10.3)
Chloride: 105 mmol/L (ref 98–111)
Creatinine, Ser: 0.47 mg/dL (ref 0.44–1.00)
GFR, Estimated: >60 mL/min (ref >60)
Glucose, Bld: 131 mg/dL — ABNORMAL HIGH (ref 70–99)
Potassium: 3.4 mmol/L — ABNORMAL LOW (ref 3.5–5.1)
Sodium: 139 mmol/L (ref 135–145)

## 2020-08-07 LAB — URINALYSIS, COMPLETE (UACMP) WITH MICROSCOPIC
Bilirubin Urine: NEGATIVE
Glucose, UA: NEGATIVE mg/dL
Hgb urine dipstick: NEGATIVE
Ketones, ur: NEGATIVE mg/dL
Leukocytes,Ua: NEGATIVE
Nitrite: NEGATIVE
Protein, ur: 100 mg/dL — AB
Specific Gravity, Urine: 1.026 (ref 1.005–1.030)
pH: 5 (ref 5.0–8.0)

## 2020-08-07 LAB — CBC
HCT: 43.1 % (ref 36.0–46.0)
Hemoglobin: 14.3 g/dL (ref 12.0–15.0)
MCH: 31.8 pg (ref 26.0–34.0)
MCHC: 33.2 g/dL (ref 30.0–36.0)
MCV: 96 fL (ref 80.0–100.0)
Platelets: 257 10*3/uL (ref 150–400)
RBC: 4.49 MIL/uL (ref 3.87–5.11)
RDW: 13.6 % (ref 11.5–15.5)
WBC: 7.2 10*3/uL (ref 4.0–10.5)
nRBC: 0 % (ref 0.0–0.2)

## 2020-08-07 MED ORDER — IOHEXOL 350 MG/ML SOLN
100.0000 mL | Freq: Once | INTRAVENOUS | Status: AC | PRN
  Administered 2020-08-07: 100 mL via INTRAVENOUS

## 2020-08-07 NOTE — ED Provider Notes (Signed)
[facility] Emergency Department Provider Note  Time seen: 8:05 AM  I have reviewed the triage vital signs and the nursing notes.   HISTORY  Chief Complaint Abdominal Pain   HPI Nicole Cervantes is a 45 y.o. female with a past medical history of anxiety, back pain, bipolar, hypertension, presents to the emergency department for lower abdominal pain.  According to the patient since early this morning she has been experiencing a mild lower abdominal pain.  States it is a 2/10 dull pain however when she gets up and walks around it increases to moderate pain.  Patient denies any dysuria denies any vaginal bleeding or discharge.  States some nausea but denies any vomiting.  States loose stools chronic for her but unchanged.  No black or bloody stool.   Past Medical History:  Diagnosis Date   Anxiety    Back pain    Bipolar 1 disorder ([place])    Depression    Hypertension    DR. [place] [facility]    Patient Active Problem List   Diagnosis Date Noted   Ovarian cyst 03/17/2017   Severe episode of recurrent major depressive disorder, without psychotic features ([place]) 10/24/2015   Hypertension 10/23/2015    Past Surgical History:  Procedure Laterality Date   ABDOMINAL HYSTERECTOMY  05/2007   IN [place], [place], LEI[place]   COLONOSCOPY  2006   DX   Cyst removal to lower back     LAPAROSCOPY  2000   BURNED HOLES IN OVARIES TO RELEASE PRESSURE FROM CYSTS.  DR. [place], [place]   OVARIAN CYST REMOVAL     SKIN BIOPSY     TONSILLECTOMY      Prior to Admission medications   Medication Sig Start Date End Date Taking? Authorizing Provider  amLODipine (NORVASC) 10 MG tablet Take 10 mg by mouth daily.    [provider]  buPROPion (WELLBUTRIN) 100 MG tablet bupropion HCl 100 mg tablet 08/03/18   [provider]  cholecalciferol (VITAMIN D) 1000 units tablet Take 1,000 Units by mouth daily.    [provider]   clotrimazole-betamethasone (LOTRISONE) cream Apply externally BID prn sx up to 2 wks 04/06/19   Rod Can, CNM  ferrous sulfate 325 (65 FE) MG tablet Take by mouth.    [provider]  fluticasone Asencion Islam) 50 MCG/ACT nasal spray Two sprays in each nostril once daily 10/24/16   [provider]  gabapentin (NEURONTIN) 300 MG capsule gabapentin 300 mg capsule 02/15/18   [provider]  hydrochlorothiazide (HYDRODIURIL) 25 MG tablet Take 25 mg by mouth daily.    [provider]  loratadine (CLARITIN) 10 MG tablet Take by mouth. 10/24/16 10/24/17  [provider]  meclizine (ANTIVERT) 25 MG tablet Take by mouth. 07/08/17   [provider]  PARoxetine (PAXIL) 20 MG tablet Take by mouth. 07/08/17   [provider]  Potassium 75 MG TABS Take by mouth.    [provider]  QUEtiapine (SEROQUEL) 100 MG tablet Take by mouth. 08/22/15   [provider]    No Known Allergies  Family History  Problem Relation Age of Onset   Diabetes Mother    Hypertension Mother    Depression Mother    Stroke Mother    Bladder Cancer Father    Prostate cancer Father 70   Diabetes Father    Hypertension Father    Bipolar disorder Sister    Cancer Sister 41  BLADDER   Heart disease Maternal Grandmother    Depression Maternal Grandmother    Depression Maternal Grandfather    Bipolar disorder Sister    Bipolar disorder Other    Depression Maternal Aunt    Depression Maternal Uncle     Social History Social History   Tobacco Use   Smoking status: Former    Pack years: 0.00   Smokeless tobacco: Never   Tobacco comments:    QUIT 2008  Vaping Use   Vaping Use: Never used  Substance Use Topics   Alcohol use: Yes    Comment: [place]   Drug use: No    Review of Systems Constitutional: Negative for fever. Cardiovascular: Negative for chest pain. Respiratory: Negative for shortness of breath. Gastrointestinal: Mild dull lower  abdominal pain.  Positive for nausea.  Negative for vomiting or diarrhea. Genitourinary: Negative for urinary compaints Musculoskeletal: Negative for musculoskeletal complaints Neurological: Negative for headache All other ROS negative  ____________________________________________   PHYSICAL EXAM:  VITAL SIGNS: ED Triage Vitals  Enc Vitals Group     BP 08/07/20 0630 (!) 145/95     Pulse Rate 08/07/20 0629 77     Resp 08/07/20 0629 16     Temp 08/07/20 0629 97.7 F (36.5 C)     Temp Source 08/07/20 0629 Oral     SpO2 08/07/20 0630 99 %     Weight --      Height --      Head Circumference --      Peak Flow --      Pain Score --      Pain Loc --      Pain Edu? --      Excl. in [place]? --    Constitutional: Alert and oriented. Well appearing and in no distress. Eyes: Normal exam ENT      Head: Normocephalic and atraumatic.      Mouth/Throat: Mucous membranes are moist. Cardiovascular: Normal rate, regular rhythm.  Respiratory: Normal respiratory effort without tachypnea nor retractions. Breath sounds are clear  Gastrointestinal: Soft, mild lower abdominal tenderness to palpation more so in the suprapubic to right lower quadrant area.  No rebound guarding or distention. Musculoskeletal: Nontender with normal range of motion in all extremities.  Neurologic:  Normal speech and language. No gross focal neurologic deficits  Skin:  Skin is warm, dry and intact.  Psychiatric: Mood and affect are normal.  ____________________________________________    EKG  EKG viewed and interpreted by myself shows a normal sinus rhythm at 70 bpm with a narrow QRS, left axis deviation, largely normal intervals with nonspecific but no concerning ST changes.  ____________________________________________    RADIOLOGY  CT scans negative for acute abnormality  ____________________________________________   INITIAL IMPRESSION / ASSESSMENT AND PLAN / ED COURSE  Pertinent labs & imaging results  that were available during my care of the patient were reviewed by me and considered in my medical decision making (see chart for details).   Patient presents emergency department for lower abdominal pain since early this morning.  Differential would include diverticulitis, appendicitis, UTI pyelonephritis, ovarian cyst.  Patient's labs are largely within normal limits including a normal white blood cell count.  Urinalysis appears normal as well.  We will proceed with a CT scan of the abdomen/pelvis to rule out diverticulitis, colitis or appendicitis.  Patient CT scan is negative.  Lab work is reassuring including a normal urinalysis.  Given the patient's reassuring CT scan I discussed results with the patient,  patient will follow-up with her doctor in the next several days for recheck.  I discussed return precautions for any worsening pain or development of fever.  Nicole Cervantes was evaluated in Emergency Department on 08/07/2020 for the symptoms described in the history of present illness. She was evaluated in the context of the global COVID-19 pandemic, which necessitated consideration that the patient might be at risk for infection with the SARS-CoV-2 virus that causes COVID-19. Institutional protocols and algorithms that pertain to the evaluation of patients at risk for COVID-19 are in a state of rapid change based on information released by regulatory bodies including the CDC and federal and state organizations. These policies and algorithms were followed during the patient's care in the ED.  ____________________________________________   FINAL CLINICAL IMPRESSION(S) / ED DIAGNOSES  Lower abdominal pain   Harvest Dark, MD 08/07/20 1011

## 2020-08-07 NOTE — ED Notes (Signed)
Pt ambulatory to ED room, c/o sharp RLQ abdominal pain that started today around 0200 and was across lower abdomen both sides but has localized to RLQ. Pain level currently 6/10 but is worse with movement and was worse this morning. LBM yesterday. Nauseous since 1 week but no vomiting. Hx ovarian cysts.

## 2020-08-07 NOTE — ED Triage Notes (Signed)
Pt reports she has had dizziness since Sunday also having Lower abdominal pain, denies n/v/d. Pt says she feels like she has to urinate, but only urinating small amounts.

## 2020-08-19 DIAGNOSIS — R42 Dizziness and giddiness: Secondary | ICD-10-CM | POA: Insufficient documentation

## 2020-09-28 DIAGNOSIS — I10 Essential (primary) hypertension: Secondary | ICD-10-CM | POA: Diagnosis not present

## 2020-09-28 DIAGNOSIS — Z13220 Encounter for screening for lipoid disorders: Secondary | ICD-10-CM | POA: Diagnosis not present

## 2020-09-28 DIAGNOSIS — G8929 Other chronic pain: Secondary | ICD-10-CM | POA: Diagnosis not present

## 2020-09-28 DIAGNOSIS — Z1329 Encounter for screening for other suspected endocrine disorder: Secondary | ICD-10-CM | POA: Diagnosis not present

## 2020-09-28 DIAGNOSIS — R635 Abnormal weight gain: Secondary | ICD-10-CM | POA: Diagnosis not present

## 2020-09-28 DIAGNOSIS — Z1231 Encounter for screening mammogram for malignant neoplasm of breast: Secondary | ICD-10-CM | POA: Diagnosis not present

## 2020-09-28 DIAGNOSIS — Z23 Encounter for immunization: Secondary | ICD-10-CM | POA: Diagnosis not present

## 2020-09-28 DIAGNOSIS — R1031 Right lower quadrant pain: Secondary | ICD-10-CM | POA: Diagnosis not present

## 2020-09-28 DIAGNOSIS — Z1211 Encounter for screening for malignant neoplasm of colon: Secondary | ICD-10-CM | POA: Diagnosis not present

## 2020-09-28 DIAGNOSIS — R1032 Left lower quadrant pain: Secondary | ICD-10-CM | POA: Diagnosis not present

## 2020-09-28 DIAGNOSIS — Z131 Encounter for screening for diabetes mellitus: Secondary | ICD-10-CM | POA: Diagnosis not present

## 2020-10-16 DIAGNOSIS — Z1231 Encounter for screening mammogram for malignant neoplasm of breast: Secondary | ICD-10-CM | POA: Diagnosis not present

## 2020-11-28 DIAGNOSIS — Z3200 Encounter for pregnancy test, result unknown: Secondary | ICD-10-CM | POA: Diagnosis not present

## 2020-11-28 DIAGNOSIS — N390 Urinary tract infection, site not specified: Secondary | ICD-10-CM | POA: Diagnosis not present

## 2020-12-05 DIAGNOSIS — M546 Pain in thoracic spine: Secondary | ICD-10-CM | POA: Diagnosis not present

## 2020-12-05 DIAGNOSIS — R300 Dysuria: Secondary | ICD-10-CM | POA: Diagnosis not present

## 2020-12-26 DIAGNOSIS — Z1211 Encounter for screening for malignant neoplasm of colon: Secondary | ICD-10-CM | POA: Diagnosis not present

## 2020-12-26 DIAGNOSIS — K648 Other hemorrhoids: Secondary | ICD-10-CM | POA: Diagnosis not present

## 2020-12-26 DIAGNOSIS — D123 Benign neoplasm of transverse colon: Secondary | ICD-10-CM | POA: Diagnosis not present

## 2020-12-26 DIAGNOSIS — D125 Benign neoplasm of sigmoid colon: Secondary | ICD-10-CM | POA: Diagnosis not present

## 2020-12-26 DIAGNOSIS — K635 Polyp of colon: Secondary | ICD-10-CM | POA: Diagnosis not present

## 2021-01-04 ENCOUNTER — Other Ambulatory Visit: Payer: Self-pay

## 2021-01-04 DIAGNOSIS — N761 Subacute and chronic vaginitis: Secondary | ICD-10-CM

## 2021-01-04 MED ORDER — CLOTRIMAZOLE-BETAMETHASONE 1-0.05 % EX CREA: TOPICAL_CREAM | CUTANEOUS | 1 refills | Status: DC

## 2021-01-04 NOTE — Telephone Encounter (Signed)
Pt  called triage needing a refill on her Lotrisone cream. Pt states she was told she didn't need to be seen for 2 years, I told her I would send you the message and see if you would refill the cream

## 2021-02-22 DIAGNOSIS — K648 Other hemorrhoids: Secondary | ICD-10-CM | POA: Diagnosis not present

## 2021-02-22 DIAGNOSIS — G8929 Other chronic pain: Secondary | ICD-10-CM | POA: Diagnosis not present

## 2021-02-22 DIAGNOSIS — R1031 Right lower quadrant pain: Secondary | ICD-10-CM | POA: Diagnosis not present

## 2021-02-22 DIAGNOSIS — K5900 Constipation, unspecified: Secondary | ICD-10-CM | POA: Diagnosis not present

## 2021-02-22 DIAGNOSIS — R1032 Left lower quadrant pain: Secondary | ICD-10-CM | POA: Diagnosis not present

## 2021-07-13 ENCOUNTER — Other Ambulatory Visit: Payer: Self-pay

## 2021-07-13 ENCOUNTER — Emergency Department: Payer: BC Managed Care – PPO

## 2021-07-13 ENCOUNTER — Encounter: Payer: Self-pay | Admitting: Emergency Medicine

## 2021-07-13 ENCOUNTER — Emergency Department
Admission: EM | Admit: 2021-07-13 | Discharge: 2021-07-13 | Disposition: A | Discharge status: Home / Self Care | Payer: BC Managed Care – PPO | Attending: Emergency Medicine | Admitting: Emergency Medicine

## 2021-07-13 DIAGNOSIS — R197 Diarrhea, unspecified: Secondary | ICD-10-CM | POA: Diagnosis not present

## 2021-07-13 DIAGNOSIS — R1110 Vomiting, unspecified: Secondary | ICD-10-CM | POA: Diagnosis not present

## 2021-07-13 DIAGNOSIS — R1032 Left lower quadrant pain: Secondary | ICD-10-CM | POA: Diagnosis not present

## 2021-07-13 DIAGNOSIS — I10 Essential (primary) hypertension: Secondary | ICD-10-CM | POA: Diagnosis not present

## 2021-07-13 DIAGNOSIS — R109 Unspecified abdominal pain: Secondary | ICD-10-CM | POA: Diagnosis not present

## 2021-07-13 DIAGNOSIS — R1084 Generalized abdominal pain: Secondary | ICD-10-CM | POA: Diagnosis not present

## 2021-07-13 LAB — COMPREHENSIVE METABOLIC PANEL
ALT: 51 U/L — ABNORMAL HIGH (ref 0–44)
AST: 47 U/L — ABNORMAL HIGH (ref 15–41)
Albumin: 3.3 g/dL — ABNORMAL LOW (ref 3.5–5.0)
Alkaline Phosphatase: 69 U/L (ref 38–126)
Anion gap: 7 (ref 5–15)
BUN: 20 mg/dL (ref 6–20)
CO2: 26 mmol/L (ref 22–32)
Calcium: 8.5 mg/dL — ABNORMAL LOW (ref 8.9–10.3)
Chloride: 106 mmol/L (ref 98–111)
Creatinine, Ser: 0.77 mg/dL (ref 0.44–1.00)
GFR, Estimated: >60 mL/min (ref >60)
Glucose, Bld: 123 mg/dL — ABNORMAL HIGH (ref 70–99)
Potassium: 2.8 mmol/L — ABNORMAL LOW (ref 3.5–5.1)
Sodium: 139 mmol/L (ref 135–145)
Total Bilirubin: 0.7 mg/dL (ref 0.3–1.2)
Total Protein: 7 g/dL (ref 6.5–8.1)

## 2021-07-13 LAB — CBC WITH DIFFERENTIAL/PLATELET
Abs Immature Granulocytes: 0.02 10*3/uL (ref 0.00–0.07)
Basophils Absolute: 0 10*3/uL (ref 0.0–0.1)
Basophils Relative: 1 %
Eosinophils Absolute: 0.1 10*3/uL (ref 0.0–0.5)
Eosinophils Relative: 1 %
HCT: 43.2 % (ref 36.0–46.0)
Hemoglobin: 14.5 g/dL (ref 12.0–15.0)
Immature Granulocytes: 0 %
Lymphocytes Relative: 25 %
Lymphs Abs: 1.8 10*3/uL (ref 0.7–4.0)
MCH: 29.6 pg (ref 26.0–34.0)
MCHC: 33.6 g/dL (ref 30.0–36.0)
MCV: 88.2 fL (ref 80.0–100.0)
Monocytes Absolute: 0.5 10*3/uL (ref 0.1–1.0)
Monocytes Relative: 7 %
Neutro Abs: 4.7 10*3/uL (ref 1.7–7.7)
Neutrophils Relative %: 66 %
Platelets: 242 10*3/uL (ref 150–400)
RBC: 4.9 MIL/uL (ref 3.87–5.11)
RDW: 13.2 % (ref 11.5–15.5)
WBC: 7.2 10*3/uL (ref 4.0–10.5)
nRBC: 0 % (ref 0.0–0.2)

## 2021-07-13 LAB — LIPASE, BLOOD: Lipase: 25 U/L (ref 11–51)

## 2021-07-13 MED ORDER — POTASSIUM CHLORIDE CRYS ER 20 MEQ PO TBCR: 20.0000 meq | EXTENDED_RELEASE_TABLET | Freq: Every day | ORAL | 0 refills | Status: DC

## 2021-07-13 MED ORDER — IOHEXOL 300 MG/ML  SOLN
100.0000 mL | Freq: Once | INTRAMUSCULAR | Status: AC | PRN
  Administered 2021-07-13: 100 mL via INTRAVENOUS

## 2021-07-13 MED ORDER — SODIUM CHLORIDE 0.9 % IV BOLUS
1000.0000 mL | Freq: Once | INTRAVENOUS | Status: AC
  Administered 2021-07-13: 1000 mL via INTRAVENOUS

## 2021-07-13 MED ORDER — POTASSIUM CHLORIDE 20 MEQ PO PACK
40.0000 meq | PACK | ORAL | Status: AC
  Administered 2021-07-13: 40 meq via ORAL
  Filled 2021-07-13: qty 2

## 2021-07-13 MED ORDER — PANTOPRAZOLE SODIUM 40 MG IV SOLR
40.0000 mg | Freq: Once | INTRAVENOUS | Status: AC
  Administered 2021-07-13: 40 mg via INTRAVENOUS
  Filled 2021-07-13: qty 10

## 2021-07-13 MED ORDER — FAMOTIDINE 20 MG PO TABS: 20.0000 mg | ORAL_TABLET | Freq: Two times a day (BID) | ORAL | 0 refills | Status: DC

## 2021-07-13 MED ORDER — ONDANSETRON HCL 4 MG/2ML IJ SOLN
4.0000 mg | Freq: Once | INTRAMUSCULAR | Status: AC
  Administered 2021-07-13: 4 mg via INTRAVENOUS
  Filled 2021-07-13: qty 2

## 2021-07-13 MED ORDER — ONDANSETRON 4 MG PO TBDP: 4.0000 mg | ORAL_TABLET | Freq: Three times a day (TID) | ORAL | 0 refills | Status: DC | PRN

## 2021-07-13 MED ORDER — NAPROXEN 500 MG PO TABS: 500.0000 mg | ORAL_TABLET | Freq: Two times a day (BID) | ORAL | 0 refills | Status: DC

## 2021-07-13 MED ORDER — KETOROLAC TROMETHAMINE 15 MG/ML IJ SOLN
15.0000 mg | Freq: Once | INTRAMUSCULAR | Status: AC
  Administered 2021-07-13: 15 mg via INTRAVENOUS
  Filled 2021-07-13: qty 1

## 2021-07-13 NOTE — ED Provider Notes (Signed)
[facility] Provider Note    Event Date/Time   First MD Initiated Contact with Patient 07/13/21 1141     (approximate)   History   Chief Complaint: Abdominal Pain   HPI  Nicole Cervantes is a 46 y.o. female with a history of hypertension bipolar disorder and chronic back pain who comes ED complaining of lower abdominal pain and diarrhea for the past 3 days.  Abdominal pain is worse in the left lower quadrant, crampy and intermittent.  Waxing and waning and no aggravating or alleviating factors.  No vomiting, no fever or chills.  No sick contacts.     Physical Exam   Triage Vital Signs: ED Triage Vitals  Enc Vitals Group     BP 07/13/21 1124 130/82     Pulse Rate 07/13/21 1124 90     Resp 07/13/21 1124 18     Temp 07/13/21 1124 98.6 F (37 C)     Temp Source 07/13/21 1124 Oral     SpO2 07/13/21 1124 98 %     Weight 07/13/21 1129 151 lb (68.5 kg)     Height 07/13/21 1129 '5\' 4"'$  (1.626 m)     Head Circumference --      Peak Flow --      Pain Score 07/13/21 1128 7     Pain Loc --      Pain Edu? --      Excl. in [place]? --     Most recent vital signs: Vitals:   07/13/21 1124 07/13/21 1513  BP: 130/82 117/81  Pulse: 90 89  Resp: 18 16  Temp: 98.6 F (37 C) 98 F (36.7 C)  SpO2: 98% 95%    General: Awake, no distress.  CV:  Good peripheral perfusion.  Resp:  Normal effort.  Abd:  No distention.  Soft with generalized tenderness, no hernia, no peritoneal signs Other:  Soft and nontender, no edema no rash   ED Results / Procedures / Treatments   Labs (all labs ordered are listed, but only abnormal results are displayed) Labs Reviewed  COMPREHENSIVE METABOLIC PANEL - Abnormal; Notable for the following components:      Result Value   Potassium 2.8 (*)    Glucose, Bld 123 (*)    Calcium 8.5 (*)    Albumin 3.3 (*)    AST 47 (*)    ALT 51 (*)    All other components within normal limits  LIPASE, BLOOD  CBC WITH DIFFERENTIAL/PLATELET   URINALYSIS, ROUTINE W REFLEX MICROSCOPIC     EKG    RADIOLOGY CT abdomen pelvis viewed and interpreted by me, no obstruction, no intra-abdominal abscess or free air.  Radiology report reviewed   PROCEDURES:  Procedures   MEDICATIONS ORDERED IN ED: Medications  sodium chloride 0.9 % bolus 1,000 mL (1,000 mLs Intravenous New Bag/Given 07/13/21 1215)  ketorolac (TORADOL) 15 MG/ML injection 15 mg (15 mg Intravenous Given 07/13/21 1219)  ondansetron (ZOFRAN) injection 4 mg (4 mg Intravenous Given 07/13/21 1220)  pantoprazole (PROTONIX) injection 40 mg (40 mg Intravenous Given 07/13/21 1218)  iohexol (OMNIPAQUE) 300 MG/ML solution 100 mL (100 mLs Intravenous Contrast Given 07/13/21 1356)  potassium chloride (KLOR-CON) packet 40 mEq (40 mEq Oral Given 07/13/21 1506)     IMPRESSION / MDM / ASSESSMENT AND PLAN / ED COURSE  I reviewed the triage vital signs and the nursing notes.  Differential diagnosis includes, but is not limited to, diverticulitis, appendicitis, pancreatitis, dehydration, electrolyte abnormality  Patient's presentation is most consistent with acute presentation with potential threat to life or bodily function.  Patient presents with abdominal pain and diarrhea for the past 3 or 4 days.  Symptoms are nonfocal, exam is reassuring.  Vital signs unremarkable.  Serum labs and CT scan are all unremarkable.  Patient not requiring admission due to reassuring work-up.  Symptoms improving with hydration and supportive care.  Will p.o. trial and replace potassium which I think is low due to GI losses from diarrhea.       FINAL CLINICAL IMPRESSION(S) / ED DIAGNOSES   Final diagnoses:  Diarrhea, unspecified type  Generalized abdominal pain     Rx / DC Orders   ED Discharge Orders          Ordered    naproxen (NAPROSYN) 500 MG tablet  2 times daily with meals        07/13/21 1541    famotidine (PEPCID) 20 MG tablet  2 times daily         07/13/21 1541    ondansetron (ZOFRAN-ODT) 4 MG disintegrating tablet  Every 8 hours PRN        07/13/21 1541    potassium chloride SA (KLOR-CON M) 20 MEQ tablet  Daily        07/13/21 1543             Note:  This document was prepared using Dragon voice recognition software and may include unintentional dictation errors.   Carrie Mew, MD 07/13/21 [phone]

## 2021-07-13 NOTE — Discharge Instructions (Addendum)
Your symptoms today are most likely due to a viral illness.  Your lab tests and CT scan today were all okay except for a low potassium level due to the diarrhea.  Take a potassium supplement over the next several days until diarrhea has resolved.  Anti-inflammatory medicine, antacid medicine, and nausea medicine can all help control your symptoms until you are feeling better.

## 2021-07-13 NOTE — ED Triage Notes (Signed)
Pt via POV from home. Pt was sent over by [facility]. Pt c/o lower abd pain, diarrhea, and nausea since Wednesday. Denies fever. Denies urinary symptoms. Pt is A&OX4 and NAD

## 2021-07-13 NOTE — ED Notes (Signed)
Pt A&O, IV removed, pt given discharge instructions, pt ambulating with steady gait. 

## 2021-10-10 DIAGNOSIS — H938X3 Other specified disorders of ear, bilateral: Secondary | ICD-10-CM | POA: Diagnosis not present

## 2021-10-10 DIAGNOSIS — R42 Dizziness and giddiness: Secondary | ICD-10-CM | POA: Diagnosis not present

## 2021-10-15 DIAGNOSIS — J069 Acute upper respiratory infection, unspecified: Secondary | ICD-10-CM | POA: Diagnosis not present

## 2021-10-15 DIAGNOSIS — R42 Dizziness and giddiness: Secondary | ICD-10-CM | POA: Diagnosis not present

## 2021-10-15 DIAGNOSIS — H6983 Other specified disorders of Eustachian tube, bilateral: Secondary | ICD-10-CM | POA: Diagnosis not present

## 2021-10-23 DIAGNOSIS — R42 Dizziness and giddiness: Secondary | ICD-10-CM | POA: Diagnosis not present

## 2021-10-27 DIAGNOSIS — S6992XA Unspecified injury of left wrist, hand and finger(s), initial encounter: Secondary | ICD-10-CM | POA: Diagnosis not present

## 2021-10-27 DIAGNOSIS — S61203A Unspecified open wound of left middle finger without damage to nail, initial encounter: Secondary | ICD-10-CM | POA: Diagnosis not present

## 2021-11-05 ENCOUNTER — Ambulatory Visit: Payer: BC Managed Care – PPO | Admitting: Family

## 2021-11-05 ENCOUNTER — Encounter: Payer: Self-pay | Admitting: Family

## 2021-11-05 VITALS — BP 118/78 | HR 89 | Temp 98.4°F | Resp 16 | Ht 64.0 in | Wt 256.5 lb

## 2021-11-05 DIAGNOSIS — F4321 Adjustment disorder with depressed mood: Secondary | ICD-10-CM

## 2021-11-05 DIAGNOSIS — D509 Iron deficiency anemia, unspecified: Secondary | ICD-10-CM | POA: Diagnosis not present

## 2021-11-05 DIAGNOSIS — L853 Xerosis cutis: Secondary | ICD-10-CM

## 2021-11-05 DIAGNOSIS — H60311 Diffuse otitis externa, right ear: Secondary | ICD-10-CM

## 2021-11-05 DIAGNOSIS — F419 Anxiety disorder, unspecified: Secondary | ICD-10-CM | POA: Insufficient documentation

## 2021-11-05 DIAGNOSIS — Z23 Encounter for immunization: Secondary | ICD-10-CM | POA: Diagnosis not present

## 2021-11-05 DIAGNOSIS — I10 Essential (primary) hypertension: Secondary | ICD-10-CM

## 2021-11-05 DIAGNOSIS — E119 Type 2 diabetes mellitus without complications: Secondary | ICD-10-CM | POA: Insufficient documentation

## 2021-11-05 DIAGNOSIS — L301 Dyshidrosis [pompholyx]: Secondary | ICD-10-CM

## 2021-11-05 DIAGNOSIS — Z634 Disappearance and death of family member: Secondary | ICD-10-CM | POA: Insufficient documentation

## 2021-11-05 DIAGNOSIS — R519 Headache, unspecified: Secondary | ICD-10-CM

## 2021-11-05 DIAGNOSIS — D229 Melanocytic nevi, unspecified: Secondary | ICD-10-CM

## 2021-11-05 DIAGNOSIS — F439 Reaction to severe stress, unspecified: Secondary | ICD-10-CM | POA: Insufficient documentation

## 2021-11-05 DIAGNOSIS — F32A Depression, unspecified: Secondary | ICD-10-CM

## 2021-11-05 DIAGNOSIS — F319 Bipolar disorder, unspecified: Secondary | ICD-10-CM | POA: Diagnosis not present

## 2021-11-05 DIAGNOSIS — Z13220 Encounter for screening for lipoid disorders: Secondary | ICD-10-CM

## 2021-11-05 DIAGNOSIS — M2550 Pain in unspecified joint: Secondary | ICD-10-CM | POA: Insufficient documentation

## 2021-11-05 MED ORDER — NEOMYCIN-POLYMYXIN-HC 3.5-10000-1 OT SUSP: 3.0000 [drp] | Freq: Four times a day (QID) | OTIC | 0 refills | Status: AC

## 2021-11-05 MED ORDER — TRIAMCINOLONE ACETONIDE 0.1 % EX CREA: 1.0000 | TOPICAL_CREAM | Freq: Two times a day (BID) | CUTANEOUS | 0 refills | Status: DC

## 2021-11-05 NOTE — Patient Instructions (Addendum)
A referral was placed today for both psychiatry and psychology. Please let us know if you have not heard back within 2 weeks about the referral.  Recommend daily flonase as well as zyrtec nightly.   A referral was placed today for dermatology.  Please let us know if you have not heard back within 2 weeks about the referral.  A referral was placed today for psychiatry.  Please let us know if you have not heard back within 2 weeks about the referral.  ------------------------------------  I have created an order for lab work today during our visit.  Please schedule an appointment on your way out to return to the lab at your convenience. Please return fasting at your lab appointment (meaning you can only drink black coffee and or water prior to your appointment). I will reach out to you in regards to the labs when I receive the results.   ------------------------------------  Welcome to our clinic, I am happy to have you as my new patient. I am excited to continue on this healthcare journey with you.  Please keep in mind Any my chart messages you send have up to a three business day turnaround for a response.  Phone calls may take up to a one full business day turnaround for a  response.   If you need a medication refill I recommend you request it through the pharmacy as this is easiest for [place] rather than sending a message and or phone call.   Due to recent changes in healthcare laws, you may see results of your imaging and/or laboratory studies on MyChart before I have had a chance to review them.  I understand that in some cases there may be results that are confusing or concerning to you. Please understand that not all results are received at the same time and often I may need to interpret multiple results in order to provide you with the best plan of care or course of treatment. Therefore, I ask that you please give me 2 business days to thoroughly review all your results before contacting  my office for clarification. Should we see a critical lab result, you will be contacted sooner.   It was a pleasure seeing you today! Please do not hesitate to reach out with any questions and or concerns.  Regards,   Eugenia Pancoast FNP-C

## 2021-11-05 NOTE — Progress Notes (Signed)
New Patient Office Visit  Subjective:  Patient ID: Nicole Cervantes, female    DOB: Feb 20, 1975  Age: 46 y.o. MRN: [id number]  CC:  Chief Complaint  Patient presents with  . Establish Care    HPI Nicole Cervantes is here to establish care as a new patient.  Prior provider was: Dr. Guy Sandifer Pt is without acute concerns.   chronic concerns:  Vertigo: has seen [facility], recent testing last week, and they referred her from there to neurology Dr. Manuella Ghazi.   Anxiety/depression, possible bipolar, diagnosed with bipolar in the past however   IDA: has noted some hair loss.   Does have joint pain, stiffness in the am especially knees and bil ankles in the am. Stiffness is less than one hour to work out. Sister recently diagnosed with lupus. Does get rash at times on her lower legs with calf redness/papules that are fleeting within one day or so if she puts her feet up.   Reports frontal headaches daily. Sometimes hurts her eyes at times as well. Last eye exam December 2022. Does get a lot of sinus/allergy symptoms. Does have eye dryness and itchy all of the time. Ears itch here and there as well. The headaches are dull and achy and constant. Sometimes sharp and stabbing. When she was younger had headaches at times, but these feel different and started a few months ago. Some mild nausea with headaches. Light sensitive with them as well when they occur. Having to take ibuprofen down to every three days or so, tries to lay down to get headache to go away.  Past Medical History:  Diagnosis Date  . Anxiety   . Back pain   . Bipolar 1 disorder ([place])   . Depression   . Hypertension    DR. GRANDIS - [facility]    Past Surgical History:  Procedure Laterality Date  . ABDOMINAL HYSTERECTOMY  05-21-2007   IN [place], [place] - TOTAL EXCEPT OVARIES, LEIOMYOMA  . COLONOSCOPY  2006   DX  . Cyst removal to lower back    . LAPAROSCOPY  2000   BURNED HOLES IN OVARIES TO RELEASE PRESSURE FROM CYSTS.   DR. BURNS, [place] CYST REMOVAL    . SKIN BIOPSY    . TONSILLECTOMY      Family History  Problem Relation Age of Onset  . Diabetes Mother        passed away 2021/05/20  . Hypertension Mother   . Depression Mother   . Stroke Mother   . Throat cancer Mother   . Prostate cancer Father 85  . Diabetes Father   . Hypertension Father   . Bipolar disorder Sister   . Bladder Cancer Sister 31       in remission  . Lupus Sister   . Bipolar disorder Sister   . Heart disease Maternal Grandmother   . Depression Maternal Grandmother   . Depression Maternal Grandfather   . Depression Maternal Aunt   . Depression Maternal Uncle   . Bipolar disorder Other     Social History   Socioeconomic History  . Marital status: Married    Spouse name: Not on file  . Number of children: 1  . Years of education: 42  . Highest education level: Not on file  Occupational History    Employer: [employer]  Tobacco Use  . Smoking status: Former    Years: 20.00    Types: Cigarettes    Quit date:  2009    Years since quitting: 14.7  . Smokeless tobacco: Never  Vaping Use  . Vaping Use: Never used  Substance and Sexual Activity  . Alcohol use: Yes    Comment: occasionally  . Drug use: No  . Sexual activity: Yes    Birth control/protection: Surgical    Comment: Hysterectomy   Other Topics Concern  . Not on file  Social History Narrative  . Not on file   Social Determinants of Health   Financial Resource Strain: Not on file  Food Insecurity: Not on file  Transportation Needs: Not on file  Physical Activity: Not on file  Stress: Not on file  Social Connections: Not on file  Intimate Partner Violence: Not on file    Outpatient Medications Prior to Visit  Medication Sig Dispense Refill  . ALPRAZolam (XANAX) 0.5 MG tablet Take 0.5 mg by mouth daily as needed.    Marland Kitchen amLODipine (NORVASC) 10 MG tablet Take 10 mg by mouth daily.    Marland Kitchen buPROPion (WELLBUTRIN) 100 MG tablet bupropion HCl  100 mg tablet    . cholecalciferol (VITAMIN D) 1000 units tablet Take 1,000 Units by mouth daily.    . clotrimazole-betamethasone (LOTRISONE) cream Apply externally BID prn sx up to 2 wks 15 g 1  . famotidine (PEPCID) 20 MG tablet Take 1 tablet (20 mg total) by mouth 2 (two) times daily. 60 tablet 0  . hydrochlorothiazide (HYDRODIURIL) 25 MG tablet Take 25 mg by mouth daily.    . ondansetron (ZOFRAN-ODT) 4 MG disintegrating tablet Take 1 tablet (4 mg total) by mouth every 8 (eight) hours as needed for nausea or vomiting. 20 tablet 0  . PARoxetine (PAXIL) 20 MG tablet Take by mouth.    . Potassium 75 MG TABS Take by mouth.    . QUEtiapine (SEROQUEL) 100 MG tablet Take by mouth.    . ferrous sulfate 325 (65 FE) MG tablet Take by mouth.    . loratadine (CLARITIN) 10 MG tablet Take by mouth.    . potassium chloride SA (KLOR-CON M) 20 MEQ tablet Take 1 tablet (20 mEq total) by mouth daily for 7 days. 7 tablet 0  . fluticasone (FLONASE) 50 MCG/ACT nasal spray Two sprays in each nostril once daily    . gabapentin (NEURONTIN) 300 MG capsule gabapentin 300 mg capsule    . meclizine (ANTIVERT) 25 MG tablet Take by mouth.    . naproxen (NAPROSYN) 500 MG tablet Take 1 tablet (500 mg total) by mouth 2 (two) times daily with a meal. 20 tablet 0   No facility-administered medications prior to visit.    No Known Allergies      Objective:    Physical Exam Vitals reviewed.  Constitutional:      General: She is not in acute distress.    Appearance: Normal appearance. She is obese. She is not ill-appearing or toxic-appearing.  HENT:     Right Ear: No middle ear effusion.     Left Ear: A middle ear effusion (clear) is present.     Ears:     Comments: Ear canal with mild edema     Mouth/Throat:     Mouth: Mucous membranes are moist.     Pharynx: Posterior oropharyngeal erythema (cobblestoning) present. No pharyngeal swelling.     Tonsils: No tonsillar exudate.  Eyes:     Extraocular Movements:  Extraocular movements intact.     Conjunctiva/sclera: Conjunctivae normal.     Pupils: Pupils are equal, round, and reactive  to light.  Neck:     Thyroid: No thyroid mass.  Cardiovascular:     Rate and Rhythm: Normal rate and regular rhythm.  Pulmonary:     Effort: Pulmonary effort is normal.     Breath sounds: Normal breath sounds.  Abdominal:     General: Abdomen is flat. Bowel sounds are normal.     Palpations: Abdomen is soft.  Musculoskeletal:        General: Normal range of motion.  Lymphadenopathy:     Cervical:     Right cervical: No superficial cervical adenopathy.    Left cervical: No superficial cervical adenopathy.  Skin:    General: Skin is warm.     Capillary Refill: Capillary refill takes less than 2 seconds.  Neurological:     General: No focal deficit present.     Mental Status: She is alert and oriented to person, place, and time.  Psychiatric:        Mood and Affect: Mood normal.        Behavior: Behavior normal.        Thought Content: Thought content normal.        Judgment: Judgment normal.     BP 118/78   Pulse 89   Temp 98.4 F (36.9 C)   Resp 16   Ht '5\' 4"'$  (1.626 m)   Wt 256 lb 8 oz (116.3 kg)   SpO2 95%   BMI 44.03 kg/m  Wt Readings from Last 3 Encounters:  11/05/21 256 lb 8 oz (116.3 kg)  07/13/21 151 lb (68.5 kg)  04/06/19 251 lb (113.9 kg)     Health Maintenance Due  Topic Date Due  . FOOT EXAM  Never done  . OPHTHALMOLOGY EXAM  Never done  . HIV Screening  Never done  . Diabetic kidney evaluation - Urine ACR  Never done  . Hepatitis C Screening  Never done  . HEMOGLOBIN A1C  04/23/2016  . COLONOSCOPY (Pts 45-21yr Insurance coverage will need to be confirmed)  Never done  . COVID-19 Vaccine (3 - Pfizer series) 07/02/2019  . INFLUENZA VACCINE  09/03/2021    There are no preventive care reminders to display for this patient.  Lab Results  Component Value Date   TSH 4.845 (H) 10/25/2015   Lab Results  Component Value  Date   WBC 7.2 07/13/2021   HGB 14.5 07/13/2021   HCT 43.2 07/13/2021   MCV 88.2 07/13/2021   PLT 242 07/13/2021   Lab Results  Component Value Date   NA 139 07/13/2021   K 2.8 (L) 07/13/2021   CO2 26 07/13/2021   GLUCOSE 123 (H) 07/13/2021   BUN 20 07/13/2021   CREATININE 0.77 07/13/2021   BILITOT 0.7 07/13/2021   ALKPHOS 69 07/13/2021   AST 47 (H) 07/13/2021   ALT 51 (H) 07/13/2021   PROT 7.0 07/13/2021   ALBUMIN 3.3 (L) 07/13/2021   CALCIUM 8.5 (L) 07/13/2021   ANIONGAP 7 07/13/2021   Lab Results  Component Value Date   CHOL 186 10/25/2015   Lab Results  Component Value Date   HDL 52 10/25/2015   Lab Results  Component Value Date   LDLCALC 96 10/25/2015   Lab Results  Component Value Date   TRIG 188 (H) 10/25/2015   Lab Results  Component Value Date   CHOLHDL 3.6 10/25/2015   Lab Results  Component Value Date   HGBA1C 5.7 (H) 10/25/2015      Assessment & Plan:   Problem List  Items Addressed This Visit       Cardiovascular and Mediastinum   Hypertension     Endocrine   Diabetes mellitus without complication ([place])   Relevant Orders   Hemoglobin A1c   Comprehensive metabolic panel     Other   Iron deficiency anemia - Primary   Relevant Orders   CBC   Bipolar 1 disorder (HCC)   Grieving   Anxiety and depression   Relevant Medications   ALPRAZolam (XANAX) 0.5 MG tablet   Other Relevant Orders   Ambulatory referral to Psychology   Ambulatory referral to Psychiatry   Polyarthralgia   Relevant Orders   Rheumatoid factor   ANA   Comprehensive metabolic panel   Sedimentation rate   C-reactive protein   Other Visit Diagnoses     Screening for lipoid disorders       Relevant Orders   Lipid panel   Acute nonintractable headache, unspecified headache type       Relevant Orders   Sedimentation rate   C-reactive protein   Acute diffuse otitis externa of right ear       Relevant Medications   neomycin-polymyxin-hydrocortisone  (CORTISPORIN) 3.5-10000-1 OTIC suspension   Multiple atypical skin moles       Relevant Orders   Ambulatory referral to Dermatology   Dyshidrotic eczema       Relevant Medications   triamcinolone cream (KENALOG) 0.1 %       Meds ordered this encounter  Medications  . neomycin-polymyxin-hydrocortisone (CORTISPORIN) 3.5-10000-1 OTIC suspension    Sig: Place 3 drops into both ears 4 (four) times daily for 7 days.    Dispense:  4.2 mL    Refill:  0    Order Specific Question:   Supervising Provider    Answer:   BEDSOLE, AMY E [[id number]]  . triamcinolone cream (KENALOG) 0.1 %    Sig: Apply 1 Application topically 2 (two) times daily.    Dispense:  30 g    Refill:  0    Order Specific Question:   Supervising Provider    Answer:   Diona Browner, AMY E [[id number]]    Follow-up: Return in about 3 months (around 02/05/2022) for f/u diabetes.    Eugenia Pancoast, FNP

## 2021-11-06 DIAGNOSIS — H60311 Diffuse otitis externa, right ear: Secondary | ICD-10-CM | POA: Insufficient documentation

## 2021-11-06 DIAGNOSIS — R519 Headache, unspecified: Secondary | ICD-10-CM | POA: Insufficient documentation

## 2021-11-06 DIAGNOSIS — L301 Dyshidrosis [pompholyx]: Secondary | ICD-10-CM | POA: Insufficient documentation

## 2021-11-06 DIAGNOSIS — D229 Melanocytic nevi, unspecified: Secondary | ICD-10-CM | POA: Insufficient documentation

## 2021-11-06 NOTE — Assessment & Plan Note (Signed)
Ordered hga1c today pending results. Work on diabetic diet and exercise as tolerated. Yearly foot exam, and annual eye exam.   

## 2021-11-06 NOTE — Assessment & Plan Note (Signed)
Advised patient we are going to have her establish with psychiatrist referral placed as I do not treat bipolar. Continue bupropion as well as Paxil 20 mg once daily Also place referral for psychology Reviewed GAD-7 and PHQ-9

## 2021-11-06 NOTE — Assessment & Plan Note (Signed)
Continue amlodipine 10 mg and hydrochlorothiazide 25 mg once daily Pt advised of the following:  Continue medication as prescribed. Monitor blood pressure periodically and/or when you feel symptomatic. Goal is <130/90 on average. Ensure that you have rested for 30 minutes prior to checking your blood pressure. Record your readings and bring them to your next visit if necessary.work on a low sodium diet.

## 2021-11-06 NOTE — Assessment & Plan Note (Signed)
PDMP reviewed Patient to continue alprazolam 0.5 mg to take as needed

## 2021-11-06 NOTE — Assessment & Plan Note (Signed)
Autoimmune work-up in place pending results

## 2021-11-06 NOTE — Assessment & Plan Note (Signed)
  Ongoing order sed rate and CRP pending results

## 2021-11-06 NOTE — Assessment & Plan Note (Signed)
Rx for Cortisporin 3.5- 10,000- 1 otic suspension sent to pharmacy

## 2021-11-08 ENCOUNTER — Other Ambulatory Visit (INDEPENDENT_AMBULATORY_CARE_PROVIDER_SITE_OTHER): Payer: BC Managed Care – PPO

## 2021-11-08 DIAGNOSIS — R519 Headache, unspecified: Secondary | ICD-10-CM | POA: Diagnosis not present

## 2021-11-08 DIAGNOSIS — E119 Type 2 diabetes mellitus without complications: Secondary | ICD-10-CM

## 2021-11-08 DIAGNOSIS — Z13220 Encounter for screening for lipoid disorders: Secondary | ICD-10-CM | POA: Diagnosis not present

## 2021-11-08 DIAGNOSIS — M2550 Pain in unspecified joint: Secondary | ICD-10-CM | POA: Diagnosis not present

## 2021-11-08 DIAGNOSIS — D509 Iron deficiency anemia, unspecified: Secondary | ICD-10-CM | POA: Diagnosis not present

## 2021-11-08 DIAGNOSIS — L853 Xerosis cutis: Secondary | ICD-10-CM | POA: Diagnosis not present

## 2021-11-08 LAB — C-REACTIVE PROTEIN: CRP: 1.6 mg/dL (ref 0.5–20.0)

## 2021-11-08 LAB — CBC
HCT: 39.3 % (ref 36.0–46.0)
Hemoglobin: 13.6 g/dL (ref 12.0–15.0)
MCHC: 34.7 g/dL (ref 30.0–36.0)
MCV: 89.5 fl (ref 78.0–100.0)
Platelets: 231 10*3/uL (ref 150.0–400.0)
RBC: 4.39 Mil/uL (ref 3.87–5.11)
RDW: 14.2 % (ref 11.5–15.5)
WBC: 6.9 10*3/uL (ref 4.0–10.5)

## 2021-11-08 LAB — COMPREHENSIVE METABOLIC PANEL
ALT: 37 U/L — ABNORMAL HIGH (ref 0–35)
AST: 36 U/L (ref 0–37)
Albumin: 3.8 g/dL (ref 3.5–5.2)
Alkaline Phosphatase: 93 U/L (ref 39–117)
BUN: 12 mg/dL (ref 6–23)
CO2: 30 mEq/L (ref 19–32)
Calcium: 8.7 mg/dL (ref 8.4–10.5)
Chloride: 99 mEq/L (ref 96–112)
Creatinine, Ser: 0.58 mg/dL (ref 0.40–1.20)
GFR: 106.88 mL/min (ref >60.00)
Glucose, Bld: 197 mg/dL — ABNORMAL HIGH (ref 70–99)
Potassium: 3.3 mEq/L — ABNORMAL LOW (ref 3.5–5.1)
Sodium: 138 mEq/L (ref 135–145)
Total Bilirubin: 0.2 mg/dL (ref 0.2–1.2)
Total Protein: 6.8 g/dL (ref 6.0–8.3)

## 2021-11-08 LAB — TSH: TSH: 2.19 u[IU]/mL (ref 0.35–5.50)

## 2021-11-08 LAB — LIPID PANEL
Cholesterol: 193 mg/dL (ref 0–200)
HDL: 45.6 mg/dL (ref >39.00)
LDL Cholesterol: 121 mg/dL — ABNORMAL HIGH (ref 0–99)
NonHDL: 147.45
Total CHOL/HDL Ratio: 4
Triglycerides: 131 mg/dL (ref 0.0–149.0)
VLDL: 26.2 mg/dL (ref 0.0–40.0)

## 2021-11-08 LAB — HEMOGLOBIN A1C: Hgb A1c MFr Bld: 7.6 % — ABNORMAL HIGH (ref 4.6–6.5)

## 2021-11-08 LAB — SEDIMENTATION RATE: Sed Rate: 51 mm/hr — ABNORMAL HIGH (ref 0–20)

## 2021-11-08 NOTE — Progress Notes (Signed)
Please have pt make appt to discuss lab work as we will need to adjust some meds.  Can be video visit if preferred.

## 2021-11-09 LAB — RHEUMATOID FACTOR: Rheumatoid fact SerPl-aCnc: <14 IU/mL (ref <14)

## 2021-11-09 LAB — ANA: Anti Nuclear Antibody (ANA): NEGATIVE

## 2021-11-11 ENCOUNTER — Encounter: Payer: Self-pay | Admitting: Family

## 2021-11-11 ENCOUNTER — Telehealth: Payer: BC Managed Care – PPO | Admitting: Family

## 2021-11-11 VITALS — Ht 64.0 in | Wt 256.5 lb

## 2021-11-11 DIAGNOSIS — I10 Essential (primary) hypertension: Secondary | ICD-10-CM

## 2021-11-11 DIAGNOSIS — E782 Mixed hyperlipidemia: Secondary | ICD-10-CM | POA: Insufficient documentation

## 2021-11-11 DIAGNOSIS — E119 Type 2 diabetes mellitus without complications: Secondary | ICD-10-CM | POA: Diagnosis not present

## 2021-11-11 MED ORDER — BLOOD GLUCOSE METER KIT: PACK | 0 refills | Status: AC

## 2021-11-11 MED ORDER — METFORMIN HCL 500 MG PO TABS: 500.0000 mg | ORAL_TABLET | Freq: Two times a day (BID) | ORAL | 3 refills | Status: DC

## 2021-11-11 MED ORDER — LOSARTAN POTASSIUM 50 MG PO TABS: 50.0000 mg | ORAL_TABLET | Freq: Every day | ORAL | 1 refills | Status: DC

## 2021-11-11 NOTE — Progress Notes (Signed)
MyChart Video Visit    Virtual Visit via Video Note   This visit type was conducted due to national recommendations for restrictions regarding the COVID-19 Pandemic (e.g. social distancing) in an effort to limit this patient's exposure and mitigate transmission in our community. This patient is at least at moderate risk for complications without adequate follow up. This format is felt to be most appropriate for this patient at this time. Physical exam was limited by quality of the video and audio technology used for the visit. CMA was able to get the patient set up on a video visit.  Patient location: Home. Patient and provider in visit Provider location: Office  I discussed the limitations of evaluation and management by telemedicine and the availability of in person appointments. The patient expressed understanding and agreed to proceed.  Visit Date: 11/11/2021  Today's healthcare provider: Eugenia Pancoast, FNP     Subjective:    Patient ID: Nicole Cervantes, female    DOB: [birth year], 46 y.o.   MRN: [id number]  Chief Complaint  Patient presents with   Abnormal Lab    Abnormal Lab    Pt here today via video visit with concerns.   HLD: she was fasting for her labs. She does admit that she does eat red meat daily.  Lab Results  Component Value Date   CHOL 193 11/08/2021   HDL 45.60 11/08/2021   LDLCALC 121 (H) 11/08/2021   TRIG 131.0 11/08/2021   CHOLHDL 4 11/08/2021   Elevated LFT: does take tylenol ibuprofen almost daily for her headaches.   HTN: does take daily amlodipine 10 mg once daily as well as HCTZ 25 mg once daily. Does have potassium otc daily as well.   DM2: does use creamer in her coffee every am. She does state she eats carbs often and over normal portion control.  Lab Results  Component Value Date   HGBA1C 7.6 (H) 11/08/2021     Past Medical History:  Diagnosis Date   Allergy    Anxiety    Back pain    Bipolar 1 disorder ([place])    Depression     Diabetes mellitus without complication ([place])    Hypertension    DR. GRANDIS - [facility]   Migraines     Past Surgical History:  Procedure Laterality Date   ABDOMINAL HYSTERECTOMY  Jun 17, 2007   [place], [place], LEIOMYOMA   COLONOSCOPY  2006   DX   Cyst removal to lower back     LAPAROSCOPY  2000   BURNED HOLES IN OVARIES TO RELEASE PRESSURE FROM CYSTS.  DR. BURNS, [place]   OVARIAN CYST REMOVAL     SKIN BIOPSY     TONSILLECTOMY      Family History  Problem Relation Age of Onset   Diabetes Mother        passed away 2021/06/16   Hypertension Mother    Depression Mother    Stroke Mother    Throat cancer Mother    Prostate cancer Father 16   Diabetes Father    Hypertension Father    Bipolar disorder Sister    Bladder Cancer Sister 44       in remission   Lupus Sister    Bipolar disorder Sister    Heart disease Maternal Grandmother    Depression Maternal Grandmother    Depression Maternal Grandfather    Depression Maternal Aunt    Depression Maternal Uncle    Bipolar disorder  Other     Social History   Socioeconomic History   Marital status: Married    Spouse name: Not on file   Number of children: 1   Years of education: 14   Highest education level: Not on file  Occupational History    Employer: [employer]  Tobacco Use   Smoking status: Former    Years: 20.00    Types: Cigarettes    Quit date: 2009    Years since quitting: 14.7   Smokeless tobacco: Never  Vaping Use   Vaping Use: Never used  Substance and Sexual Activity   Alcohol use: Yes    Comment: occasionally   Drug use: No   Sexual activity: Yes    Birth control/protection: Surgical    Comment: Hysterectomy   Other Topics Concern   Not on file  Social History Narrative   Not on file   Social Determinants of Health   Financial Resource Strain: Not on file  Food Insecurity: Not on file  Transportation Needs: Not on file  Physical Activity: Not on file  Stress:  Not on file  Social Connections: Not on file  Intimate Partner Violence: Not on file    Outpatient Medications Prior to Visit  Medication Sig Dispense Refill   ALPRAZolam (XANAX) 0.5 MG tablet Take 0.5 mg by mouth daily as needed.     amLODipine (NORVASC) 10 MG tablet Take 10 mg by mouth daily.     buPROPion (WELLBUTRIN) 100 MG tablet bupropion HCl 100 mg tablet     cholecalciferol (VITAMIN D) 1000 units tablet Take 1,000 Units by mouth daily.     clotrimazole-betamethasone (LOTRISONE) cream Apply externally BID prn sx up to 2 wks 15 g 1   famotidine (PEPCID) 20 MG tablet Take 1 tablet (20 mg total) by mouth 2 (two) times daily. 60 tablet 0   neomycin-polymyxin-hydrocortisone (CORTISPORIN) 3.5-10000-1 OTIC suspension Place 3 drops into both ears 4 (four) times daily for 7 days. 4.2 mL 0   ondansetron (ZOFRAN-ODT) 4 MG disintegrating tablet Take 1 tablet (4 mg total) by mouth every 8 (eight) hours as needed for nausea or vomiting. 20 tablet 0   PARoxetine (PAXIL) 20 MG tablet Take by mouth.     QUEtiapine (SEROQUEL) 100 MG tablet Take by mouth.     triamcinolone cream (KENALOG) 0.1 % Apply 1 Application topically 2 (two) times daily. 30 g 0   hydrochlorothiazide (HYDRODIURIL) 25 MG tablet Take 25 mg by mouth daily.     Potassium 75 MG TABS Take by mouth.     loratadine (CLARITIN) 10 MG tablet Take by mouth.     potassium chloride SA (KLOR-CON M) 20 MEQ tablet Take 1 tablet (20 mEq total) by mouth daily for 7 days. 7 tablet 0   No facility-administered medications prior to visit.    No Known Allergies  Review of Systems     Objective:    Physical Exam Constitutional:      General: She is not in acute distress.    Appearance: Normal appearance. She is obese. She is not ill-appearing or toxic-appearing.  Pulmonary:     Effort: Pulmonary effort is normal.  Neurological:     General: No focal deficit present.     Mental Status: She is alert and oriented to person, place, and time.  Mental status is at baseline.  Psychiatric:        Mood and Affect: Mood normal.        Behavior: Behavior normal.  Thought Content: Thought content normal.        Judgment: Judgment normal.     Ht _0  (1.626 m)   Wt 256 lb 8 oz (116.3 kg)   BMI 44.03 kg/m  Wt Readings from Last 3 Encounters:  11/11/21 256 lb 8 oz (116.3 kg)  11/05/21 256 lb 8 oz (116.3 kg)  07/13/21 151 lb (68.5 kg)       Assessment & Plan:   Problem List Items Addressed This Visit       Cardiovascular and Mediastinum   Hypertension - Primary    Pt advised of following:  Cervantes potassium with HCTZ  Stop HCTZ  Continue potassium otc x 7 days then stop  Start losartan 50 mg once daily  Pt advised of the following:  Continue medication as prescribed. Monitor blood pressure periodically and/or when you feel symptomatic. Goal is <130/90 on average. Ensure that you have rested for 30 minutes prior to checking your blood pressure. Record your readings and bring them to your next visit if necessary.work on a Cervantes sodium diet.       Relevant Medications   losartan (COZAAR) 50 MG tablet     Endocrine   Diabetes mellitus without complication (HCC)    D/w pt on diet and exercise Advised to start metformin in three days after starting losartan, start once daily then increase to twice daily if tolerating well.  Work on diabetic diet and exercise as tolerated. Yearly foot exam, and annual eye exam.  F/u one month may consider GLP 1       Relevant Medications   losartan (COZAAR) 50 MG tablet   metFORMIN (GLUCOPHAGE) 500 MG tablet   blood glucose meter kit and supplies     Other   Mixed hyperlipidemia    Pt chooses to work on diet for now , as she does note that she has a lot of poor diet decisions that can be tweaked.  Work on Cervantes cholesterol diet and exercise as tolerated Will consider statin in future if needed       Relevant Medications   losartan (COZAAR) 50 MG tablet    I have discontinued  Amore M. Coward's hydrochlorothiazide, Potassium, and potassium chloride SA. I am also having her start on losartan, metFORMIN, and blood glucose meter kit and supplies. Additionally, I am having her maintain her amLODipine, loratadine, QUEtiapine, PARoxetine, cholecalciferol, buPROPion, clotrimazole-betamethasone, famotidine, ondansetron, ALPRAZolam, neomycin-polymyxin-hydrocortisone, and triamcinolone cream.  Meds ordered this encounter  Medications   losartan (COZAAR) 50 MG tablet    Sig: Take 1 tablet (50 mg total) by mouth daily.    Dispense:  90 tablet    Refill:  1    Order Specific Question:   Supervising Provider    Answer:   BEDSOLE, AMY E [[id number]]   metFORMIN (GLUCOPHAGE) 500 MG tablet    Sig: Take 1 tablet (500 mg total) by mouth 2 (two) times daily with a meal.    Dispense:  180 tablet    Refill:  3    Order Specific Question:   Supervising Provider    Answer:   BEDSOLE, AMY E [[id number]]   blood glucose meter kit and supplies    Sig: Dispense based on patient and insurance preference. Use up to four times daily as directed. (FOR ICD-10 E10.9, E11.9).    Dispense:  1 each    Refill:  0    Order Specific Question:   Supervising Provider    Answer:   Diona Browner, AMY  E [[id number]]    Order Specific Question:   Number of strips    Answer:   100    Order Specific Question:   Number of lancets    Answer:   100    I discussed the assessment and treatment plan with the patient. The patient was provided an opportunity to ask questions and all were answered. The patient agreed with the plan and demonstrated an understanding of the instructions.   The patient was advised to call back or seek an in-person evaluation if the symptoms worsen or if the condition fails to improve as anticipated.  I provided 15 minutes of face-to-face time during this encounter.   Eugenia Pancoast, [place] [facility] [place] [phone] (phone) [phone] (fax)  [place]

## 2021-11-11 NOTE — Assessment & Plan Note (Signed)
Pt advised of following:  Low potassium with HCTZ  Stop HCTZ  Continue potassium otc x 7 days then stop  Start losartan 50 mg once daily  Pt advised of the following:  Continue medication as prescribed. Monitor blood pressure periodically and/or when you feel symptomatic. Goal is <130/90 on average. Ensure that you have rested for 30 minutes prior to checking your blood pressure. Record your readings and bring them to your next visit if necessary.work on a low sodium diet.

## 2021-11-11 NOTE — Assessment & Plan Note (Signed)
D/w pt on diet and exercise Advised to start metformin in three days after starting losartan, start once daily then increase to twice daily if tolerating well.  Work on diabetic diet and exercise as tolerated. Yearly foot exam, and annual eye exam.  F/u one month may consider GLP 1

## 2021-11-11 NOTE — Assessment & Plan Note (Signed)
Pt chooses to work on diet for now , as she does note that she has a lot of poor diet decisions that can be tweaked.  Work on low cholesterol diet and exercise as tolerated Will consider statin in future if needed

## 2021-11-11 NOTE — Patient Instructions (Addendum)
  Stop HCTZ  Start losartan 50 mg once daily  Take potassium over the counter for seven more days then d/c.   Start monitoring your blood pressure daily, around the same time of day, for the next 2-3 weeks.  Ensure that you have rested for 30 minutes prior to checking your blood pressure. Record your readings and bring them to your next visit.  Start metformin 500 mg once daily for one week and then increase to twice daily thereafter.

## 2021-11-27 ENCOUNTER — Ambulatory Visit (INDEPENDENT_AMBULATORY_CARE_PROVIDER_SITE_OTHER): Payer: BC Managed Care – PPO | Admitting: Clinical

## 2021-11-27 DIAGNOSIS — F332 Major depressive disorder, recurrent severe without psychotic features: Secondary | ICD-10-CM

## 2021-11-27 NOTE — Progress Notes (Signed)
                Staley Budzinski, LCSW 
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
## 2021-11-28 ENCOUNTER — Encounter: Payer: Self-pay | Admitting: Psychiatry

## 2021-11-28 ENCOUNTER — Ambulatory Visit (INDEPENDENT_AMBULATORY_CARE_PROVIDER_SITE_OTHER): Payer: BC Managed Care – PPO | Admitting: Psychiatry

## 2021-11-28 VITALS — BP 115/78 | HR 80 | Temp 98.1°F | Ht 64.0 in | Wt 258.8 lb

## 2021-11-28 DIAGNOSIS — Z634 Disappearance and death of family member: Secondary | ICD-10-CM | POA: Diagnosis not present

## 2021-11-28 DIAGNOSIS — F419 Anxiety disorder, unspecified: Secondary | ICD-10-CM | POA: Diagnosis not present

## 2021-11-28 DIAGNOSIS — F4000 Agoraphobia, unspecified: Secondary | ICD-10-CM | POA: Insufficient documentation

## 2021-11-28 DIAGNOSIS — F331 Major depressive disorder, recurrent, moderate: Secondary | ICD-10-CM | POA: Insufficient documentation

## 2021-11-28 MED ORDER — SERTRALINE HCL 25 MG PO TABS: 25.0000 mg | ORAL_TABLET | Freq: Every day | ORAL | 0 refills | Status: DC

## 2021-11-28 MED ORDER — SERTRALINE HCL 50 MG PO TABS: 50.0000 mg | ORAL_TABLET | Freq: Every day | ORAL | 1 refills | Status: DC

## 2021-11-28 MED ORDER — PAROXETINE HCL 20 MG PO TABS: 10.0000 mg | ORAL_TABLET | Freq: Every day | ORAL | 0 refills | Status: DC

## 2021-11-28 NOTE — Patient Instructions (Addendum)
Sertraline Tablets What is this medication? SERTRALINE (SER tra leen) treats depression, anxiety, obsessive-compulsive disorder (OCD), post-traumatic stress disorder (PTSD), and premenstrual dysphoric disorder (PMDD). It increases the amount of serotonin in the brain, a hormone that helps regulate mood. It belongs to a group of medications called SSRIs. This medicine may be used for other purposes; ask your health care provider or pharmacist if you have questions. COMMON BRAND NAME(S): Zoloft What should I tell my care team before I take this medication? They need to know if you have any of these conditions: Bleeding disorders Bipolar disorder or a family history of bipolar disorder Frequently drink alcohol Glaucoma Heart disease High blood pressure History of irregular heartbeat History of low levels of calcium, magnesium, or potassium in the blood Liver disease Receiving electroconvulsive therapy Seizures Suicidal thoughts, plans, or attempt by you or a family member Take medications that prevent or treat blood clots Thyroid disease An unusual or allergic reaction to sertraline, other medications, foods, dyes, or preservatives Pregnant or trying to get pregnant Breastfeeding How should I use this medication? Take this medication by mouth with a glass of water. Take it as directed on the prescription label at the same time every day. You can take it with or without food. If it upsets your stomach, take it with food. Do not take your medication more often than directed. Keep taking this medication unless your care team tells you to stop. Stopping it too quickly can cause serious side effects. It can also make your condition worse. A special MedGuide will be given to you by the pharmacist with each prescription and refill. Be sure to read this information carefully each time. Talk to your care team about the use of this medication in children. While it may be prescribed for children  as young as 7 years for selected conditions, precautions do apply. Overdosage: If you think you have taken too much of this medicine contact a poison control center or emergency room at once. NOTE: This medicine is only for you. Do not share this medicine with others. What if I miss a dose? If you miss a dose, take it as soon as you can. If it is almost time for your next dose, take only that dose. Do not take double or extra doses. What may interact with this medication? Do not take this medication with any of the following: Cisapride Dronedarone Linezolid MAOIs, such as Carbex, Eldepryl, Marplan, Nardil, and Parnate Methylene blue (injected into a vein) Pimozide Thioridazine This medication may also interact with the following: Alcohol Amphetamines Aspirin and aspirin-like medications Certain medications for fungal infections, such as ketoconazole, fluconazole, posaconazole, itraconazole Certain medications for irregular heart beat, such as flecainide, quinidine, propafenone Certain medications for mental health conditions Certain medications for migraine headaches, such as almotriptan, eletriptan, frovatriptan, naratriptan, rizatriptan, sumatriptan, zolmitriptan Certain medications for seizures, such as carbamazepine, valproic acid, phenytoin Certain medications for sleep Certain medications that prevent or treat blood clots, such as warfarin, enoxaparin, dalteparin Cimetidine Digoxin Diuretics Fentanyl Isoniazid Lithium NSAIDs, medications for pain and inflammation, such as ibuprofen or naproxen Other medications that cause heart rhythm changes, such as dofetilide Rasagiline Safinamide Supplements, such as St. John's wort, kava kava, valerian Tolbutamide Tramadol Tryptophan This list may not describe all possible interactions. Give your health care provider a list of all the medicines, herbs, non-prescription drugs, or dietary supplements you use. Also tell them if you  smoke, drink alcohol, or use illegal drugs. Some items may interact with  your medicine. What should I watch for while using this medication? Tell your care team if your symptoms do not get better or if they get worse. Visit your care team for regular checks on your progress. Because it may take several weeks to see the full effects of this medication, it is important to continue your treatment as prescribed by your care team. Patients and their families should watch out for new or worsening thoughts of suicide or depression. Also watch out for sudden changes in feelings such as feeling anxious, agitated, panicky, irritable, hostile, aggressive, impulsive, severely restless, overly excited and hyperactive, or not being able to sleep. If this happens, especially at the beginning of treatment or after a change in dose, call your care team. This medication may affect your coordination, reaction time, or judgment. Do not drive or operate machinery until you know how this medication affects you. Sit or stand up slowly to reduce the risk of dizzy or fainting spells. Drinking alcohol with this medication can increase the risk of these side effects. Your mouth may get dry. Chewing sugarless gum or sucking hard candy, and drinking plenty of water may help. Contact your care team if the problem does not go away or is severe. What side effects may I notice from receiving this medication? Side effects that you should report to your care team as soon as possible: Allergic reactions--skin rash, itching, hives, swelling of the face, lips, tongue, or throat Bleeding--bloody or black, tar-like stools, red or dark brown urine, vomiting blood or brown material that looks like coffee grounds, small red or purple spots on skin, unusual bleeding or bruising Heart rhythm changes--fast or irregular heartbeat, dizziness, feeling faint or lightheaded, chest pain, trouble breathing Low sodium level--muscle weakness, fatigue,  dizziness, headache, confusion Serotonin syndrome--irritability, confusion, fast or irregular heartbeat, muscle stiffness, twitching muscles, sweating, high fever, seizure, chills, vomiting, diarrhea Sudden eye pain or change in vision such as blurred vision, seeing halos around lights, vision loss Thoughts of suicide or self-harm, worsening mood Side effects that usually do not require medical attention (report these to your care team if they continue or are bothersome): Change in sex drive or performance Diarrhea Excessive sweating Nausea Tremors or shaking Upset stomach This list may not describe all possible side effects. Call your doctor for medical advice about side effects. You may report side effects to FDA at 1-800-FDA-1088. Where should I keep my medication? Keep out of the reach of children and pets. Store at room temperature between 20 and 25 degrees C (68 and 77 degrees F). Get rid of any unused medication after the expiration date. To get rid of medications that are no longer needed or expired: Take the medication to a medication take-back program. Check with your pharmacy or law enforcement to find a location. If you cannot return the medication, check the label or package insert to see if the medication should be thrown out in the garbage or flushed down the toilet. If you are not sure, ask your care team. If it is safe to put in the trash, empty the medication out of the container. Mix the medication with cat litter, dirt, coffee grounds, or other unwanted substance. Seal the mixture in a bag or container. Put it in the trash. NOTE: This sheet is a summary. It may not cover all possible information. If you have questions about this medicine, talk to your doctor, pharmacist, or health care provider.  2023 Elsevier/Gold Standard (2021-08-20 00:00:00)
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
## 2021-12-08 ENCOUNTER — Other Ambulatory Visit: Payer: Self-pay | Admitting: Family

## 2021-12-10 ENCOUNTER — Encounter: Payer: Self-pay | Admitting: Family

## 2021-12-10 MED ORDER — AMLODIPINE BESYLATE 10 MG PO TABS: 10.0000 mg | ORAL_TABLET | Freq: Every day | ORAL | 1 refills | Status: DC

## 2021-12-16 ENCOUNTER — Telehealth: Payer: Self-pay

## 2021-12-16 DIAGNOSIS — F331 Major depressive disorder, recurrent, moderate: Secondary | ICD-10-CM

## 2021-12-16 NOTE — Telephone Encounter (Signed)
pt called left message that she new to dr. Shea Evans but that she was told by her provider that Dr. Shea Evans would take over refilling thte bupropion hcl '100mg'$  . pt last seenon 11-28-21 next appt 01-30-22

## 2021-12-16 NOTE — Telephone Encounter (Signed)
Will defer this to Dr. Shea Evans, who will be back tomorrow to avoid any confusion.

## 2021-12-17 MED ORDER — BUPROPION HCL 100 MG PO TABS: 100.0000 mg | ORAL_TABLET | Freq: Every day | ORAL | 0 refills | Status: DC

## 2021-12-17 NOTE — Telephone Encounter (Signed)
I have sent Wellbutrin 100 mg to [facility]

## 2021-12-19 NOTE — Telephone Encounter (Signed)
Message was left that rx was sent to pharmacy

## 2021-12-24 ENCOUNTER — Ambulatory Visit: Payer: BC Managed Care – PPO | Admitting: Clinical

## 2021-12-24 DIAGNOSIS — F332 Major depressive disorder, recurrent severe without psychotic features: Secondary | ICD-10-CM

## 2021-12-24 NOTE — Progress Notes (Signed)
  [facility] Counselor/Therapist Progress Note  Patient ID: Nicole Cervantes, MRN: [id number]    Date: 12/24/21  Time Spent: 12:31  pm - 1:26 pm : 55 Minutes  Treatment Type: Individual Therapy.  Reported Symptoms: Patient reported recent impulsivity, "negative emotions", vivid dreams, increased fatigue.   Mental Status Exam: Appearance:  Well Groomed     Behavior: Appropriate  Motor: Normal  Speech/Language:  Clear and Coherent  Affect: Appropriate  Mood: Patient stated,  "I'm in a really good mood today"  Thought process: normal  Thought content:   WNL  Sensory/Perceptual disturbances:   WNL  Orientation: oriented to person, place, and situation  Attention: Good  Concentration: Good  Memory: WNL  Fund of knowledge:  Good  Insight:   Good  Judgment:  Fair  Impulse Control: Fair   Risk Assessment: Danger to Self:  No Patient denied current suicidal ideation Self-injurious Behavior: No  Danger to Others: No Patient denied current homicidal ideation Duty to Warn:no Physical Aggression / Violence:No  Access to Firearms a concern: No  Gang Involvement:No   Subjective:  Patient stated, "they haven't been going very good at all" since last session. Patient reported a recent appointment with the psychiatrist and reported during that appointment Paxil was discontinued and the psychiatrist prescribed sertraline '50mg'$ . Patient reported initially she was experiencing vivid dreams and waking up sweating after starting the sertraline. Patient reported after starting the sertraline she was lying in bed and had thoughts of punching herself but reported she didn't follow through with the thoughts. Patient reported she hasn't felt "the negativity" in the past three days. Patient reported she recently realized she has "a spending issue" and stated,  "I just wanted to buy stuff". Patient reported she wanted to purchase items that she normally wouldn't purchase online. Patient stated  "in my mind" she was experiencing thoughts of   infidelity but reported she didn't follow through with the thoughts. Patient reported experiencing "negative emotions" during periods of increased spending and thoughts of promiscuity. Patient reported recent symptoms have occurred for at least two weeks. Patient reported recent changes in symptoms started after recent change in medication. Patient stated, she is "feeling better these past couple days". Patient stated, "I want to make myself better" and stated, "I definitely want to do therapy for sure".   Interventions: Discussed recent appointment with psychiatrist, medication changes, and recent changes in symptoms.  Discussed patient contacting psychiatrist to discuss her concerns regarding medication change. Provided psycho education related to psychotropic medications. Clinician reviewed diagnosis and treatment recommendations. Provided psycho education related to diagnosis and treatment.    Diagnosis:  Severe episode of recurrent major depressive disorder, without psychotic features ([place])  R/O Bipolar Disorder  Plan: Goals to be completed during follow up appointment 01/16/22.                     Katherina Right, LCSW

## 2022-01-06 DIAGNOSIS — G43809 Other migraine, not intractable, without status migrainosus: Secondary | ICD-10-CM | POA: Diagnosis not present

## 2022-01-06 DIAGNOSIS — E1165 Type 2 diabetes mellitus with hyperglycemia: Secondary | ICD-10-CM | POA: Diagnosis not present

## 2022-01-06 DIAGNOSIS — F339 Major depressive disorder, recurrent, unspecified: Secondary | ICD-10-CM | POA: Diagnosis not present

## 2022-01-06 DIAGNOSIS — G4733 Obstructive sleep apnea (adult) (pediatric): Secondary | ICD-10-CM | POA: Diagnosis not present

## 2022-01-10 ENCOUNTER — Ambulatory Visit: Payer: Self-pay | Admitting: Psychiatry

## 2022-01-16 ENCOUNTER — Ambulatory Visit: Payer: BC Managed Care – PPO | Admitting: Clinical

## 2022-01-16 DIAGNOSIS — F332 Major depressive disorder, recurrent severe without psychotic features: Secondary | ICD-10-CM
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
## 2022-01-16 NOTE — Progress Notes (Signed)
                Edrei Norgaard, LCSW 

## 2022-01-23 ENCOUNTER — Ambulatory Visit: Payer: BC Managed Care – PPO | Admitting: Clinical

## 2022-01-30 ENCOUNTER — Ambulatory Visit: Payer: Self-pay | Admitting: Psychiatry

## 2022-01-30 ENCOUNTER — Telehealth (INDEPENDENT_AMBULATORY_CARE_PROVIDER_SITE_OTHER): Payer: BC Managed Care – PPO | Admitting: Psychiatry

## 2022-01-30 ENCOUNTER — Encounter: Payer: Self-pay | Admitting: Psychiatry

## 2022-01-30 DIAGNOSIS — F331 Major depressive disorder, recurrent, moderate: Secondary | ICD-10-CM

## 2022-01-30 DIAGNOSIS — F4000 Agoraphobia, unspecified: Secondary | ICD-10-CM

## 2022-01-30 DIAGNOSIS — F4010 Social phobia, unspecified: Secondary | ICD-10-CM | POA: Diagnosis not present

## 2022-01-30 DIAGNOSIS — Z634 Disappearance and death of family member: Secondary | ICD-10-CM

## 2022-01-30 MED ORDER — BUPROPION HCL 75 MG PO TABS: 75.0000 mg | ORAL_TABLET | Freq: Every morning | ORAL | 1 refills | Status: DC

## 2022-01-30 MED ORDER — SERTRALINE HCL 100 MG PO TABS: 100.0000 mg | ORAL_TABLET | Freq: Every day | ORAL | 1 refills | Status: DC

## 2022-01-30 NOTE — Progress Notes (Signed)
Virtual Visit via Video Note  I connected with Nicole Cervantes on 01/30/22 at  8:30 AM EST by a video enabled telemedicine application and verified that I am speaking with the correct person using two identifiers.  Location Provider Location : [facility] Patient Location : Work  Participants: Patient , Provider    I discussed the limitations of evaluation and management by telemedicine and the availability of in person appointments. The patient expressed understanding and agreed to proceed.   I discussed the assessment and treatment plan with the patient. The patient was provided an opportunity to ask questions and all were answered. The patient agreed with the plan and demonstrated an understanding of the instructions.   The patient was advised to call back or seek an in-person evaluation if the symptoms worsen or if the condition fails to improve as anticipated.   [facility] MD OP Progress Note  01/30/2022 9:00 AM Nicole Cervantes  MRN:  [id number]  Chief Complaint:  Chief Complaint  Patient presents with   Follow-up   Medication Refill   Depression   Anxiety   HPI: Nicole Cervantes is a 46 year old Caucasian female, married, employed, lives in [place], has a history of MDD, agoraphobia, anxiety disorder, bereavement, hypertension, diabetes mellitus was evaluated by telemedicine today.  Patient today reports she is tolerating the sertraline well.  She has noticed some good benefit from her being on the sertraline with regards to her mood.  Although she continues to feel on edge, irritable easily frustrated.  She reports she does have interrupted sleep at night.  She was told she may have sleep apnea, has sleep study scheduled for January 24.  Unknown if this is affecting her mood as well.  She continues to feel tired during the day mostly because of sleep issues and possibility of sleep apnea.  Patient does report social anxiety, although she is able to go into group situations she feels uncomfortable  and is unable to engage or participate.  Patient also has a history of agoraphobia and has difficulty going out by herself.  Currently continues to follow-up with her therapist.  Patient reports she is compliant on the Wellbutrin.  Patient reports work is going well.  She had to take couple of days off from work due to flulike symptoms.  She continues to feel congested although she is better today.  Patient denies any suicidality, homicidality or perceptual disturbances.  Reports she is coping better with her grief.  It was a bit difficult during the holidays although she managed it well.  Visit Diagnosis:    ICD-10-CM   1. MDD (major depressive disorder), recurrent episode, moderate (HCC)  F33.1 buPROPion (WELLBUTRIN) 75 MG tablet    sertraline (ZOLOFT) 100 MG tablet    2. Agoraphobia  F40.00 sertraline (ZOLOFT) 100 MG tablet    3. Social anxiety disorder  F40.10     4. Bereavement  Z63.4       Past Psychiatric History: Reviewed past psychiatric history from progress note on 11/28/2021.  Past trials of medications like Paxil, Lamictal, Wellbutrin, Seroquel.  Past Medical History:  Past Medical History:  Diagnosis Date   Allergy    Anxiety    Back pain    Depression    Diabetes mellitus without complication ([place])    Hypertension    DR. GRANDIS - [facility]   Migraines     Past Surgical History:  Procedure Laterality Date   ABDOMINAL HYSTERECTOMY  05/2007   IN [place], [place] -  TOTAL EXCEPT OVARIES, LEIOMYOMA   COLONOSCOPY  2006   DX   Cyst removal to lower back     LAPAROSCOPY  2000   BURNED HOLES IN OVARIES TO RELEASE PRESSURE FROM CYSTS.  DR. BURNS, [place]   OVARIAN CYST REMOVAL     SKIN BIOPSY     TONSILLECTOMY      Family Psychiatric History: Reviewed family psychiatric history from progress note on 11/28/2021.  Family History:  Family History  Problem Relation Age of Onset   Diabetes Mother        passed away 05-31-21   Hypertension Mother    Depression  Mother    Stroke Mother    Throat cancer Mother    Alcohol abuse Father    Prostate cancer Father 85   Diabetes Father    Hypertension Father    Bipolar disorder Sister    Bladder Cancer Sister 68       in remission   Lupus Sister    Bipolar disorder Sister    Depression Maternal Aunt    Depression Maternal Uncle    Alcohol abuse Maternal Grandfather    Depression Maternal Grandfather    Alcohol abuse Maternal Grandmother    Heart disease Maternal Grandmother    Depression Maternal Grandmother    Bipolar disorder Other     Social History: Reviewed social history from progress note on 11/28/2021. Social History   Socioeconomic History   Marital status: Married    Spouse name: Not on file   Number of children: 1   Years of education: 14   Highest education level: Associate degree: academic program  Occupational History    Employer: [employer]  Tobacco Use   Smoking status: Former    Years: 20.00    Types: Cigarettes    Quit date: 2009    Years since quitting: 14.9   Smokeless tobacco: Never  Vaping Use   Vaping Use: Never used  Substance and Sexual Activity   Alcohol use: Yes    Comment: occasionally once a month   Drug use: No   Sexual activity: Yes    Birth control/protection: Surgical    Comment: Hysterectomy   Other Topics Concern   Not on file  Social History Narrative   Not on file   Social Determinants of Health   Financial Resource Strain: Not on file  Food Insecurity: Not on file  Transportation Needs: Not on file  Physical Activity: Not on file  Stress: Not on file  Social Connections: Not on file    Allergies: No Known Allergies  Metabolic Disorder Labs: Lab Results  Component Value Date   HGBA1C 7.6 (H) 11/08/2021   MPG 117 10/25/2015   No results found for: "PROLACTIN" Lab Results  Component Value Date   CHOL 193 11/08/2021   TRIG 131.0 11/08/2021   HDL 45.60 11/08/2021   CHOLHDL 4 11/08/2021   VLDL 26.2 11/08/2021    LDLCALC 121 (H) 11/08/2021   LDLCALC 96 10/25/2015   Lab Results  Component Value Date   TSH 2.19 11/08/2021   TSH 4.845 (H) 10/25/2015    Therapeutic Level Labs: No results found for: "LITHIUM" No results found for: "VALPROATE" No results found for: "CBMZ"  Current Medications: Current Outpatient Medications  Medication Sig Dispense Refill   ACCU-CHEK GUIDE test strip USE TO CHECK BLOOD SUGAR UP TO 4 TIMES DAILY AS DIRECTED     Accu-Chek Softclix Lancets lancets SMARTSIG:Topical 1-4 Times Daily     ALPRAZolam (XANAX) 0.5  MG tablet Take 0.5 mg by mouth daily as needed.     amLODipine (NORVASC) 10 MG tablet Take 1 tablet (10 mg total) by mouth daily. 90 tablet 1   blood glucose meter kit and supplies Dispense based on patient and insurance preference. Use up to four times daily as directed. (FOR ICD-10 E10.9, E11.9). 1 each 0   buPROPion (WELLBUTRIN) 75 MG tablet Take 1 tablet (75 mg total) by mouth in the morning. 30 tablet 1   clotrimazole-betamethasone (LOTRISONE) cream Apply externally BID prn sx up to 2 wks 15 g 1   famotidine (PEPCID) 20 MG tablet Take 1 tablet (20 mg total) by mouth 2 (two) times daily. (Patient taking differently: Take 20 mg by mouth daily.) 60 tablet 0   loratadine (CLARITIN) 10 MG tablet Take 1 tablet by mouth daily.     losartan (COZAAR) 50 MG tablet Take 1 tablet (50 mg total) by mouth daily. 90 tablet 1   metFORMIN (GLUCOPHAGE) 500 MG tablet Take 1 tablet (500 mg total) by mouth 2 (two) times daily with a meal. 180 tablet 3   ondansetron (ZOFRAN-ODT) 4 MG disintegrating tablet Take 1 tablet (4 mg total) by mouth every 8 (eight) hours as needed for nausea or vomiting. 20 tablet 0   rOPINIRole (REQUIP) 0.25 MG tablet      sertraline (ZOLOFT) 100 MG tablet Take 1 tablet (100 mg total) by mouth daily. 30 tablet 1   triamcinolone cream (KENALOG) 0.1 % Apply 1 Application topically 2 (two) times daily. 30 g 0   No current facility-administered medications for  this visit.     Musculoskeletal: Strength & Muscle Tone:  UTA Gait & Station:  Seated Patient leans: N/A  Psychiatric Specialty Exam: Review of Systems  Constitutional:  Positive for fatigue.  HENT:  Positive for congestion.   Psychiatric/Behavioral:  Positive for sleep disturbance. The patient is nervous/anxious.        Irritable  All other systems reviewed and are negative.   There were no vitals taken for this visit.There is no height or weight on file to calculate BMI.  General Appearance: Casual  Eye Contact:  Fair  Speech:  Clear and Coherent  Volume:  Normal  Mood:  Anxious and Irritable  Affect:  Appropriate  Thought Process:  Goal Directed and Descriptions of Associations: Intact  Orientation:  Full (Time, Place, and Person)  Thought Content: Logical   Suicidal Thoughts:  No  Homicidal Thoughts:  No  Memory:  Immediate;   Fair Recent;   Fair Remote;   Fair  Judgement:  Fair  Insight:  Fair  Psychomotor Activity:  Normal  Concentration:  Concentration: Fair and Attention Span: Fair  Recall:  [employer] of Knowledge: Fair  Language: Fair  Akathisia:   None  Handed:  Right  AIMS (if indicated): not done  Assets:  Communication Skills Desire for [place] Talents/Skills Transportation  ADL's:  Intact  Cognition: WNL  Sleep:  Poor   Screenings: AUDIT    Flowsheet Row Admission (Discharged) from 10/24/2015 in [place]  Alcohol Use Disorder Identification Test Final Score (AUDIT) 0      GAD-7    Flowsheet Row Office Visit from 11/28/2021 in [facility] Office Visit from 11/05/2021 in [place] [facility]  Total GAD-7 Score 4 9      PHQ2-9    [place] Video Visit from 01/30/2022 in [facility] Office Visit from  11/28/2021 in [facility] Office Visit from 11/05/2021 in [place]  at [place]  PHQ-2 Total Score _0 PHQ-9 Total Score _1 Flowsheet Row Video Visit from 01/30/2022 in [place] Office Visit from 11/28/2021 in [facility] ED from 07/13/2021 in [place] CATEGORY Low Risk Low Risk No Risk        Assessment and Plan: MITALI SHENEFIELD is a 46 year old Caucasian female, employed, married, lives in [place], has a history of depression, anxiety, multiple medical problems was evaluated by telemedicine today.  Patient with some improvement on the current medication changes, continues to have sleep problems, irritability and will benefit from the following plan.  Plan  MDD-some improvement Increase sertraline to 100 mg p.o. daily in the morning Will reduce Wellbutrin to 75 mg p.o. daily Continue CBT with Ms. Katherina Right  Agoraphobia-improving Continue CBT Patient does have Xanax available, although she has been limiting use.  Xanax was prescribed for vertigo by her provider.  Bereavement-improving Continue CBT  Social anxiety disorder-unstable Increase sertraline to 100 mg p.o. daily in the morning Continue CBT.  Follow-up in clinic in 6 to 8 weeks or sooner if needed. Collaboration of Care: Collaboration of Care: Referral or follow-up with counselor/therapist AEB patient encouraged to continue to follow-up with therapist.  Patient/Guardian was advised Release of Information must be obtained prior to any record release in order to collaborate their care with an outside provider. Patient/Guardian was advised if they have not already done so to contact the registration department to sign all necessary forms in order for [place] to release information regarding their care.   Consent: Patient/Guardian gives verbal consent for treatment and assignment of benefits for services provided during this visit. Patient/Guardian expressed  understanding and agreed to proceed.   This note was generated in part or whole with voice recognition software. Voice recognition is usually quite accurate but there are transcription errors that can and very often do occur. I apologize for any typographical errors that were not detected and corrected.      Nicole Alert, MD 01/30/2022, 9:00 AM

## 2022-02-03 HISTORY — PX: COLONOSCOPY: SHX174

## 2022-02-05 ENCOUNTER — Ambulatory Visit: Payer: BC Managed Care – PPO | Admitting: Family

## 2022-02-06 ENCOUNTER — Ambulatory Visit: Payer: BC Managed Care – PPO | Admitting: Clinical

## 2022-02-06 DIAGNOSIS — F332 Major depressive disorder, recurrent severe without psychotic features: Secondary | ICD-10-CM

## 2022-02-06 NOTE — Progress Notes (Signed)
[facility] Counselor/Therapist Progress Note  Patient ID: Nicole Cervantes, MRN: [id number],    Date: 02/06/2022  Time Spent: 12:32pm - 1:25pm : 53 minutes   Treatment Type: Individual Therapy  Reported Symptoms: Patient stated, "Im having a great day" in response to current mood.   Mental Status Exam: Appearance:  Well Groomed     Behavior: Appropriate  Motor: Normal  Speech/Language:  Clear and Coherent  Affect: Appropriate  Mood: normal  Thought process: normal  Thought content:   WNL  Sensory/Perceptual disturbances:   WNL  Orientation: oriented to person, place, and situation  Attention: Good  Concentration: Good  Memory: WNL  Fund of knowledge:  Good  Insight:   Good  Judgment:  Good  Impulse Control: Good   Risk Assessment: Danger to Self:  No Patient denied current suicidal ideation Self-injurious Behavior: No Danger to Others: No Patient denied current homicidal ideation Duty to Warn:no Physical Aggression / Violence:No  Access to Firearms a concern: No  Gang Involvement:No   Subjective: Patient stated, "its been up and down" since last session but stated, "right now I have a lot of positivity in my head". Patient reported she finds writing her thoughts/feelings in her journal helpful. Patient reported she and co-worker are still not speaking after recent conflict. Patient reported journaling, use of positive affirmations, positive changes at work, and stated, "Im sick of being depressed" have contributed to recent change in patient's perspective.  Patient reported feeling "if you put positive out there positive will come back to you". Patient reported she is trying to respond differently to situations, such as, the state of the kitchen when she gets home from work. Patient reported she was upset by the way her cousin cut the potatoes for a meal and instead of responding out of anger, patient stated she "took a breath" and thanked her cousin for cutting the  potatoes. Patient stated, "I felt good" in regards to her response to the situation with her cousin. Patient reported feeling she and her husband "are not on the same page anymore". Patient reported feeling she always participates in the activities her husband prefers. Patient stated,  "I feel I'm not as in love with him as I use to be".  Patient reported her husband encourages her to participate in activities she enjoys but patient stated, "I don't allow myself to do that stuff", and reported this is due to her previous husband's response to patient participating in activities independently.  Patient reported she has been journaling and is maintaining gratitude journal. Patient stated, "I definitely think it would be a positive for [place]" and reported her husband has brought up marital counseling in the past. Patient reported she is open to participation in martial counseling.    Interventions: Cognitive Behavioral Therapy. Discussed recent change in patient's perspective and contributing factors to recent change in perspective. Discussed patient's use of positive affirmations. Processed recent conflict with patient's cousin regarding meal preparation and change in patient's communication with cousin in response. Provided psycho education related to the use of "I feel" statements. Processed changes in patient's responses to situation and patient's feelings regarding the outcome. Discussed recent changes in patient's marriage and patient's thoughts/feelings regarding her marriage. Provided psycho education related to maintaining a healthy relationships. Explored and identified coping strategies to utilize in response to conflict with husband, such as, focusing on the outcome of the situation.Reviewed patient's homework. Recommended marital counseling and discussed patient's thoughts/feeling regarding recommendation. Clinician requested patient continue thought record  and gratitude log for homework.      Diagnosis:Severe episode of recurrent major depressive disorder, without psychotic features ([place])   Plan: Patient is to utilize [employer] [facility], thought re-framing, relaxation techniques, effective communication strategies, dialectical behavior therapy skills, and coping strategies to decrease symptoms associated with Major Depressive Disorder. Frequency: weekly  Modality: individual      Long-term goal:   Patient stated, "to be happy again" and "be a positive person".    Reduce overall level, frequency, and intensity of the feelings of depression as evidenced by decreased irritability, easily angered, increased appetite, difficulty getting out of bed due to fatigue, going to bed and staying in bed after dinner, decrease in hygiene, sleeping late in the mornings, waking up in the middle of the night multiple times, lack of energy, change in concentration, difficulty recalling information at work, taking longer to complete tasks, "melancholy" mood, loss of interest, feelings of guilt, apologizing frequently, and symptoms of anxiety from 7 days per week to 0 to 1 days per week per patient report for at least 1 month.   Target Date: 01/17/23  Progress: progressing    Short-term goal:  Verbalize an understanding of the relationship between depressive symptoms and patient's communication with husband per patient's report Target Date: 06/17/22  Progress: progressing    Develop effective communication strategies for patient to utilize when expressing her thoughts and feelings to others in a controlled and assertive way  Target Date: 06/17/22  Progress: progressing    Verbally express an understanding of the relationship between feelings of depression, anxiety and the impact on thought patterns and behaviors.   Target Date: 06/17/22  Progress: progressing    Identify triggers for anger/irritability and develop coping strategies to utilize in response to feelings of anger/irritability  to reduce anger outbursts per patient's report    Target Date: 06/17/22  Progress: progressing    Identify, challenge, and replace negative thought patterns and negative self talk that contribute to feelings of depression and anxiety with positive thoughts, beliefs, and positive self talk per patient's report   Target Date: 06/17/22  Progress: progressing    Develop and implement emotion regulation skills to decrease fluctuations in mood, such as, anger, irritability, anxiety, and depression   Target Date: 06/17/22  Progress: progressing     Katherina Right, LCSW

## 2022-02-06 NOTE — Progress Notes (Signed)
                Sriya Kroeze, LCSW 

## 2022-02-11 ENCOUNTER — Telehealth: Payer: BC Managed Care – PPO | Admitting: Family

## 2022-02-11 ENCOUNTER — Encounter: Payer: Self-pay | Admitting: Family

## 2022-02-11 VITALS — Ht 64.0 in | Wt 255.0 lb

## 2022-02-11 DIAGNOSIS — E782 Mixed hyperlipidemia: Secondary | ICD-10-CM | POA: Diagnosis not present

## 2022-02-11 DIAGNOSIS — F319 Bipolar disorder, unspecified: Secondary | ICD-10-CM

## 2022-02-11 DIAGNOSIS — R700 Elevated erythrocyte sedimentation rate: Secondary | ICD-10-CM

## 2022-02-11 DIAGNOSIS — L301 Dyshidrosis [pompholyx]: Secondary | ICD-10-CM

## 2022-02-11 DIAGNOSIS — E7800 Pure hypercholesterolemia, unspecified: Secondary | ICD-10-CM

## 2022-02-11 DIAGNOSIS — E119 Type 2 diabetes mellitus without complications: Secondary | ICD-10-CM | POA: Diagnosis not present

## 2022-02-11 DIAGNOSIS — I10 Essential (primary) hypertension: Secondary | ICD-10-CM

## 2022-02-11 DIAGNOSIS — G43809 Other migraine, not intractable, without status migrainosus: Secondary | ICD-10-CM

## 2022-02-11 DIAGNOSIS — G4733 Obstructive sleep apnea (adult) (pediatric): Secondary | ICD-10-CM

## 2022-02-11 DIAGNOSIS — R1310 Dysphagia, unspecified: Secondary | ICD-10-CM

## 2022-02-11 MED ORDER — TRIAMCINOLONE ACETONIDE 0.1 % EX CREA: 1.0000 | TOPICAL_CREAM | Freq: Two times a day (BID) | CUTANEOUS | Status: DC | PRN

## 2022-02-11 MED ORDER — OZEMPIC (0.25 OR 0.5 MG/DOSE) 2 MG/1.5ML ~~LOC~~ SOPN: PEN_INJECTOR | SUBCUTANEOUS | 0 refills | Status: DC

## 2022-02-11 MED ORDER — FAMOTIDINE 20 MG PO TABS: 20.0000 mg | ORAL_TABLET | Freq: Every day | ORAL | Status: DC | PRN

## 2022-02-11 NOTE — Assessment & Plan Note (Signed)
Ordered hga1c today pending results. Work on diabetic diet and exercise as tolerated. Urine microalbumin ordered as well pending results. Continue metformin 500 mg twice daily. Start ozempic 0.25 mg weekly Power  Schedule eye exam as due for this year.

## 2022-02-11 NOTE — Assessment & Plan Note (Signed)
Continue amlodipine 10 mg and hctz 25 mg once daily.  Pt advised of the following:  Continue medication as prescribed. Monitor blood pressure periodically and/or when you feel symptomatic. Goal is <130/90 on average. Ensure that you have rested for 30 minutes prior to checking your blood pressure. Record your readings and bring them to your next visit if necessary.work on a low sodium diet.

## 2022-02-11 NOTE — Assessment & Plan Note (Signed)
Continue current medications and continue f/u as scheduled with psychiatry

## 2022-02-11 NOTE — Assessment & Plan Note (Signed)
Stable

## 2022-02-11 NOTE — Progress Notes (Signed)
MyChart Video Visit    Virtual Visit via Video Note   This visit type was conducted due to national recommendations for restrictions regarding the COVID-19 Pandemic (e.g. social distancing) in an effort to limit this patient's exposure and mitigate transmission in our community. This patient is at least at moderate risk for complications without adequate follow up. This format is felt to be most appropriate for this patient at this time. Physical exam was limited by quality of the video and audio technology used for the visit. CMA was able to get the patient set up on a video visit.  Patient location: Home. Patient and provider in visit Provider location: Office  I discussed the limitations of evaluation and management by telemedicine and the availability of in person appointments. The patient expressed understanding and agreed to proceed.  Visit Date: 02/11/2022  Today's healthcare provider: Eugenia Pancoast, FNP     Subjective:    Patient ID: Nicole Cervantes, female    DOB: [birth year], 47 y.o.   MRN: [id number]  Chief Complaint  Patient presents with   Follow-up    diabetes    HPI  Pt here today via video visit for f/u appointment.  HTN: taking amlodipine 10 mg and HCTZ 25 mg once daily. She states average is in the 'green' range with her cuff. Typically average is around 120/80 or so. She is tolerating medication pretty well.    Dm2: currently taking metformin 500 mg twice daily. Last eye exam was in 2023, due now and will make appt.  Lab Results  Component Value Date   HGBA1C 7.6 (H) 11/08/2021   Bipolar: doing well on bupropion 75 mg (new decrease in dose) , d/c paxil per her psychiatrist and placed on sertraline 100 mg once daily. She states she is doing well with then new dosing.   Headaches: recently saw neurologist Dr. Manuella Ghazi, was started on magnesium and doing well. No longer with migraines since starting magnesium. She thinks she is on 300 mg once daily.   Past  Medical History:  Diagnosis Date   Allergy    Anxiety    Back pain    Depression    Diabetes mellitus without complication ([place])    Hypertension    DR. GRANDIS - [facility]   Migraines     Past Surgical History:  Procedure Laterality Date   ABDOMINAL HYSTERECTOMY  06/07/2007   IN [place], [place], LEIOMYOMA   COLONOSCOPY  2006   DX   Cyst removal to lower back     LAPAROSCOPY  2000   BURNED HOLES IN OVARIES TO RELEASE PRESSURE FROM CYSTS.  DR. BURNS, [place]   OVARIAN CYST REMOVAL     SKIN BIOPSY     TONSILLECTOMY      Family History  Problem Relation Age of Onset   Diabetes Mother        passed away 06/06/21   Hypertension Mother    Depression Mother    Stroke Mother    Throat cancer Mother    Alcohol abuse Father    Prostate cancer Father 59   Diabetes Father    Hypertension Father    Bipolar disorder Sister    Bladder Cancer Sister 3       in remission   Lupus Sister    Bipolar disorder Sister    Depression Maternal Aunt    Depression Maternal Uncle    Alcohol abuse Maternal Grandfather    Depression Maternal Grandfather  Alcohol abuse Maternal Grandmother    Heart disease Maternal Grandmother    Depression Maternal Grandmother    Bipolar disorder Other     Social History   Socioeconomic History   Marital status: Married    Spouse name: Not on file   Number of children: 1   Years of education: 14   Highest education level: Associate degree: academic program  Occupational History    Employer: [employer]  Tobacco Use   Smoking status: Former    Years: 20.00    Types: Cigarettes    Quit date: 2009    Years since quitting: 15.0   Smokeless tobacco: Never  Vaping Use   Vaping Use: Never used  Substance and Sexual Activity   Alcohol use: Yes    Comment: occasionally once a month   Drug use: No   Sexual activity: Yes    Birth control/protection: Surgical    Comment: Hysterectomy   Other Topics Concern   Not on file   Social History Narrative   Not on file   Social Determinants of Health   Financial Resource Strain: Not on file  Food Insecurity: Not on file  Transportation Needs: Not on file  Physical Activity: Not on file  Stress: Not on file  Social Connections: Not on file  Intimate Partner Violence: Not on file    Outpatient Medications Prior to Visit  Medication Sig Dispense Refill   ACCU-CHEK GUIDE test strip USE TO CHECK BLOOD SUGAR UP TO 4 TIMES DAILY AS DIRECTED     Accu-Chek Softclix Lancets lancets SMARTSIG:Topical 1-4 Times Daily     ALPRAZolam (XANAX) 0.5 MG tablet Take 0.5 mg by mouth daily as needed.     amLODipine (NORVASC) 10 MG tablet Take 1 tablet (10 mg total) by mouth daily. 90 tablet 1   blood glucose meter kit and supplies Dispense based on patient and insurance preference. Use up to four times daily as directed. (FOR ICD-10 E10.9, E11.9). 1 each 0   buPROPion (WELLBUTRIN) 75 MG tablet Take 1 tablet (75 mg total) by mouth in the morning. 30 tablet 1   clotrimazole-betamethasone (LOTRISONE) cream Apply externally BID prn sx up to 2 wks 15 g 1   loratadine (CLARITIN) 10 MG tablet Take 1 tablet by mouth daily.     losartan (COZAAR) 50 MG tablet Take 1 tablet (50 mg total) by mouth daily. 90 tablet 1   metFORMIN (GLUCOPHAGE) 500 MG tablet Take 1 tablet (500 mg total) by mouth 2 (two) times daily with a meal. 180 tablet 3   ondansetron (ZOFRAN-ODT) 4 MG disintegrating tablet Take 1 tablet (4 mg total) by mouth every 8 (eight) hours as needed for nausea or vomiting. 20 tablet 0   rOPINIRole (REQUIP) 0.25 MG tablet      sertraline (ZOLOFT) 100 MG tablet Take 1 tablet (100 mg total) by mouth daily. 30 tablet 1   famotidine (PEPCID) 20 MG tablet Take 1 tablet (20 mg total) by mouth 2 (two) times daily. (Patient taking differently: Take 20 mg by mouth daily as needed.) 60 tablet 0   triamcinolone cream (KENALOG) 0.1 % Apply 1 Application topically 2 (two) times daily. (Patient taking  differently: Apply 1 Application topically 2 (two) times daily as needed.) 30 g 0   No facility-administered medications prior to visit.    No Known Allergies  Review of Systems     Objective:    Physical Exam Constitutional:      General: She is not in  acute distress.    Appearance: Normal appearance. She is not ill-appearing or toxic-appearing.  Pulmonary:     Effort: Pulmonary effort is normal.  Neurological:     General: No focal deficit present.     Mental Status: She is alert and oriented to person, place, and time. Mental status is at baseline.  Psychiatric:        Mood and Affect: Mood normal.        Behavior: Behavior normal.        Thought Content: Thought content normal.        Judgment: Judgment normal.     Ht '5\' 4"'$  (1.626 m)   Wt 255 lb (115.7 kg)   BMI 43.77 kg/m  Wt Readings from Last 3 Encounters:  02/11/22 255 lb (115.7 kg)  11/11/21 256 lb 8 oz (116.3 kg)  11/05/21 256 lb 8 oz (116.3 kg)       Assessment & Plan:   Problem List Items Addressed This Visit       Cardiovascular and Mediastinum   Hypertension    Continue amlodipine 10 mg and hctz 25 mg once daily.  Pt advised of the following:  Continue medication as prescribed. Monitor blood pressure periodically and/or when you feel symptomatic. Goal is <130/90 on average. Ensure that you have rested for 30 minutes prior to checking your blood pressure. Record your readings and bring them to your next visit if necessary.work on a Cervantes sodium diet.       Vestibular migraine    Stable.         Respiratory   OSA (obstructive sleep apnea)     Digestive   Dysphagia   Relevant Medications   famotidine (PEPCID) 20 MG tablet     Endocrine   Diabetes mellitus without complication ([place])    Ordered hga1c today pending results. Work on diabetic diet and exercise as tolerated. Urine microalbumin ordered as well pending results. Continue metformin 500 mg twice daily. Start ozempic 0.25 mg weekly  [place]  Schedule eye exam as due for this year.      Relevant Medications   Semaglutide,0.25 or 0.'5MG'$ /DOS, (OZEMPIC, 0.25 OR 0.5 MG/DOSE,) 2 MG/1.5ML SOPN   Other Relevant Orders   Hemoglobin A1c   Microalbumin / creatinine urine ratio     Musculoskeletal and Integument   Dyshidrotic eczema   Relevant Medications   triamcinolone cream (KENALOG) 0.1 %     Other   Bipolar 1 disorder (HCC)    Continue current medications and continue f/u as scheduled with psychiatry        Mixed hyperlipidemia   Relevant Orders   Hemoglobin A1c   Lipid panel   Other Visit Diagnoses     Elevated sed rate    -  Primary   Relevant Orders   Sedimentation rate   Elevated LDL cholesterol level           I have changed Prajna M. Schweiss's famotidine and triamcinolone cream. I am also having her start on Ozempic (0.25 or 0.5 MG/DOSE). Additionally, I am having her maintain her clotrimazole-betamethasone, ondansetron, ALPRAZolam, losartan, metFORMIN, blood glucose meter kit and supplies, rOPINIRole, loratadine, Accu-Chek Softclix Lancets, Accu-Chek Guide, amLODipine, buPROPion, and sertraline.  Meds ordered this encounter  Medications   famotidine (PEPCID) 20 MG tablet    Sig: Take 1 tablet (20 mg total) by mouth daily as needed.    Order Specific Question:   Supervising Provider    Answer:   Diona Browner, AMY E [[id number]]  triamcinolone cream (KENALOG) 0.1 %    Sig: Apply 1 Application topically 2 (two) times daily as needed.    Order Specific Question:   Supervising Provider    Answer:   Diona Browner, AMY E [[id number]]   Semaglutide,0.25 or 0.'5MG'$ /DOS, (OZEMPIC, 0.25 OR 0.5 MG/DOSE,) 2 MG/1.5ML SOPN    Sig: Inject 0.25 mg into the skin once a week for 30 days, THEN 0.5 mg once a week.    Dispense:  2.25 mL    Refill:  0    Order Specific Question:   Supervising Provider    Answer:   BEDSOLE, AMY E [[id number]]    I discussed the assessment and treatment plan with the patient. The patient was provided an opportunity to  ask questions and all were answered. The patient agreed with the plan and demonstrated an understanding of the instructions.   The patient was advised to call back or seek an in-person evaluation if the symptoms worsen or if the condition fails to improve as anticipated.  I provided 15 minutes of face-to-face time during this encounter.   Eugenia Pancoast, [place] [place] [facility] [place] [phone] (phone) [phone] (fax)  [place]

## 2022-02-11 NOTE — Patient Instructions (Addendum)
   I sent in a prescription for a weight loss medication called Ozempic. If approved you can start this as prescribed. Sometimes we need to complete a prior auth prior to approval.  If this is not approved, I will try another medication. If not approved,  I will have to refer you to our weight loss clinic where they might have better approval odds, and can help you on your weight loss journey.   If approved, you will start 0.25 mg once weekly for four weeks, then increase to 0.50 mg once weekly if tolerating well after four weeks. Be sure to make a follow up appointment in three months after starting the medication so we can document weight loss, and monitor how you are doing on the medication.   Bring with you your new diet plan and weight weigh ins at least once weekly to document your journey.   Some apps to look at our myfitnesspal and or the NOOM app.  Also look at [email or web address].[email or web address] if you prefer pre made meals that are delivered to your door step   I have created an order for lab work today during our visit.  Please schedule an appointment on your way out to return to the lab at your convenience. Please return fasting at your lab appointment (meaning you can only drink black coffee and or water prior to your appointment). I will reach out to you in regards to the labs when I receive the results.    Regards,   Eugenia Pancoast FNP-C

## 2022-02-18 ENCOUNTER — Other Ambulatory Visit (INDEPENDENT_AMBULATORY_CARE_PROVIDER_SITE_OTHER): Payer: BC Managed Care – PPO

## 2022-02-18 DIAGNOSIS — E119 Type 2 diabetes mellitus without complications: Secondary | ICD-10-CM

## 2022-02-18 DIAGNOSIS — R700 Elevated erythrocyte sedimentation rate: Secondary | ICD-10-CM | POA: Diagnosis not present

## 2022-02-18 DIAGNOSIS — E782 Mixed hyperlipidemia: Secondary | ICD-10-CM

## 2022-02-18 LAB — LIPID PANEL
Cholesterol: 210 mg/dL — ABNORMAL HIGH (ref 0–200)
HDL: 44.2 mg/dL (ref >39.00)
LDL Cholesterol: 133 mg/dL — ABNORMAL HIGH (ref 0–99)
NonHDL: 165.48
Total CHOL/HDL Ratio: 5
Triglycerides: 161 mg/dL — ABNORMAL HIGH (ref 0.0–149.0)
VLDL: 32.2 mg/dL (ref 0.0–40.0)

## 2022-02-18 LAB — HEMOGLOBIN A1C: Hgb A1c MFr Bld: 6.8 % — ABNORMAL HIGH (ref 4.6–6.5)

## 2022-02-18 LAB — MICROALBUMIN / CREATININE URINE RATIO
Creatinine,U: 154.7 mg/dL
Microalb Creat Ratio: 31.1 mg/g — ABNORMAL HIGH (ref 0.0–30.0)
Microalb, Ur: 48.1 mg/dL — ABNORMAL HIGH (ref 0.0–1.9)

## 2022-02-18 LAB — SEDIMENTATION RATE: Sed Rate: 63 mm/hr — ABNORMAL HIGH (ref 0–20)

## 2022-02-19 ENCOUNTER — Other Ambulatory Visit: Payer: Self-pay | Admitting: Family

## 2022-02-19 ENCOUNTER — Ambulatory Visit: Payer: BC Managed Care – PPO | Admitting: Clinical

## 2022-02-19 DIAGNOSIS — E782 Mixed hyperlipidemia: Secondary | ICD-10-CM

## 2022-02-19 DIAGNOSIS — F332 Major depressive disorder, recurrent severe without psychotic features: Secondary | ICD-10-CM | POA: Diagnosis not present

## 2022-02-19 MED ORDER — ATORVASTATIN CALCIUM 10 MG PO TABS: 10.0000 mg | ORAL_TABLET | Freq: Every day | ORAL | 3 refills | Status: DC

## 2022-02-19 NOTE — Progress Notes (Signed)
                Katherina Right, LCSW

## 2022-02-19 NOTE — Progress Notes (Signed)
Cholesterol a bit elevated, work on low cholesterol diet exercise as tolerated. Considering pt also with diabetes and high blood pressure increased risk factors for cardiovascular disease, so I do suggest daily medication for cholesterol management. I suggest starting low dose lipitor is pt willing?  Urine microalbumin is positive, continue losartan as this will further protect kidneys from stress.   Diabetes improved to 6.8 from 7.6. let's keep it up!

## 2022-02-19 NOTE — Progress Notes (Signed)
[facility] Counselor/Therapist Progress Note  Patient ID: MELONY TENPAS, MRN: [id number],    Date: 02/19/2022  Time Spent: 12:32pm - 1:26pm : 54 minutes   Treatment Type: Individual Therapy  Reported Symptoms: Patient reported no recent anger outbursts  Mental Status Exam: Appearance:  Neat     Behavior: Appropriate  Motor: Normal  Speech/Language:  Clear and Coherent  Affect: Appropriate  Mood: normal  Thought process: normal  Thought content:   WNL  Sensory/Perceptual disturbances:   WNL  Orientation: oriented to person, place, and situation  Attention: Good  Concentration: Good  Memory: WNL  Fund of knowledge:  Good  Insight:   Good  Judgment:  Good  Impulse Control: Good   Risk Assessment: Danger to Self:  No Patient denied current suicidal ideation Self-injurious Behavior: No Danger to Others: No Patient denied current homicidal ideation Duty to Warn:no Physical Aggression / Violence:No  Access to Firearms a concern: No  Gang Involvement:No   Subjective: Patient stated, "really really good" when clinician inquired about status since last session. Patient reported she continues to view situations from a different perspective and is utilizing a positive perspective. Patient reported no recent anger outbursts.  Patient reported she has maintained her gratitude journal and thought record. Patient reported no entries in thought record due to improvement in mood since last session. Patient reported she recently started taking ozempic and has made healthy dietary changes. Patient reported she researched local pools for exercise but the pool schedule did not fit with her schedule. Patient reported her husband agreed to participate in marital counseling. Patient reported she feels by utilizing a positive perspective stressors are improving. Patient reported she completed the relationship activity discussed during previous session and found it helpful in her  relationship with her husband. Patient reported the activity allowed patient to identify some qualities/memories she had forgotten. Patient stated, "things are getting much better". Patient reported feeling "unworthy" and reported low self esteem. Patient reported the way she feels about herself is a trigger for patient to push her husband away. Patient reported fear that husband will pursue a relationship with her neighbor because her husband was in a relationship when they met. Patient reported in response she prepares for their relationship to end. Patient reported  "great" mood since last session.   Interventions: Cognitive Behavioral Therapy. Reviewed patient's gratitude journal and thought record. Identified several examples of gratitude in patient's gratitude journal. Discussed recent strategies patient has implemented for self care. Reviewed recommendation for marital counseling and patient/husband's response. Explored patient's thoughts/feelings about herself and ways in which those thoughts/feelings impact patient's relationship with her husband. Provided psycho education related to thought distortions, such as, generalization. Explored and identified thought distortions associated with patient's thoughts regarding her husband's interactions with their neighbor. Provided psycho education related to using socratic questions to challenge thought distortions. Clinician requested for homework patient practice utilizing socratic questions to challenge thought distortions.    Diagnosis:Severe episode of recurrent major depressive disorder, without psychotic features ([place])   Plan: Patient is to utilize [employer] [facility], thought re-framing, relaxation techniques, effective communication strategies, dialectical behavior therapy skills, and coping strategies to decrease symptoms associated with Major Depressive Disorder. Frequency: weekly  Modality: individual      Long-term goal:   Patient  stated, "to be happy again" and "be a positive person".    Reduce overall level, frequency, and intensity of the feelings of depression as evidenced by decreased irritability, easily angered, increased appetite, difficulty getting out  of bed due to fatigue, going to bed and staying in bed after dinner, decrease in hygiene, sleeping late in the mornings, waking up in the middle of the night multiple times, lack of energy, change in concentration, difficulty recalling information at work, taking longer to complete tasks, "melancholy" mood, loss of interest, feelings of guilt, apologizing frequently, and symptoms of anxiety from 7 days per week to 0 to 1 days per week per patient report for at least 1 month.   Target Date: 01/17/23  Progress: progressing    Short-term goal:  Verbalize an understanding of the relationship between depressive symptoms and patient's communication with husband per patient's report Target Date: 06/17/22  Progress: progressing    Develop effective communication strategies for patient to utilize when expressing her thoughts and feelings to others in a controlled and assertive way  Target Date: 06/17/22  Progress: progressing    Verbally express an understanding of the relationship between feelings of depression, anxiety and the impact on thought patterns and behaviors.   Target Date: 06/17/22  Progress: progressing    Identify triggers for anger/irritability and develop coping strategies to utilize in response to feelings of anger/irritability to reduce anger outbursts per patient's report    Target Date: 06/17/22  Progress: progressing    Identify, challenge, and replace negative thought patterns and negative self talk that contribute to feelings of depression and anxiety with positive thoughts, beliefs, and positive self talk per patient's report   Target Date: 06/17/22  Progress: progressing    Develop and implement emotion regulation skills to decrease  fluctuations in mood, such as, anger, irritability, anxiety, and depression   Target Date: 06/17/22  Progress: progressing     Katherina Right, LCSW

## 2022-02-27 DIAGNOSIS — G4733 Obstructive sleep apnea (adult) (pediatric): Secondary | ICD-10-CM | POA: Diagnosis not present

## 2022-03-04 ENCOUNTER — Ambulatory Visit: Payer: BC Managed Care – PPO | Admitting: Clinical

## 2022-03-04 DIAGNOSIS — F3341 Major depressive disorder, recurrent, in partial remission: Secondary | ICD-10-CM

## 2022-03-04 NOTE — Progress Notes (Signed)
                Katherina Right, LCSW

## 2022-03-04 NOTE — Progress Notes (Signed)
[facility] Counselor/Therapist Progress Note  Patient ID: Nicole Cervantes, MRN: [id number],    Date: 03/04/2022  Time Spent: 12:32pm - 1:21pm : 49 minutes   Treatment Type: Individual Therapy  Reported Symptoms: Patient reported no recent symptoms.   Mental Status Exam: Appearance:  Neat     Behavior: Appropriate  Motor: Normal  Speech/Language:  Clear and Coherent  Affect: Appropriate  Mood: normal  Thought process: normal  Thought content:   WNL  Sensory/Perceptual disturbances:   WNL  Orientation: oriented to person, place, and situation  Attention: Good  Concentration: Good  Memory: WNL  Fund of knowledge:  Good  Insight:   Good  Judgment:  Good  Impulse Control: Good   Risk Assessment: Danger to Self:  No Patient denied current suicidal ideation Self-injurious Behavior: No Danger to Others: No Patient denied current homicidal ideation Duty to Warn:no Physical Aggression / Violence:No  Access to Firearms a concern: No  Gang Involvement:No   Subjective: Patient stated, "things have been amazing" since last session. Patient stated "things are fantastic" between patient and husband. Patient reported she did not complete therapy homework due to absence of negative thoughts since last session. Patient reported she has been spending time in her hot tub and reported utilizing hot tub "relaxes me", "makes me feel calm". Patient reported no recent depressed mood or feelings of anger. Patient reported she feels participation in therapy, vocalizing patient's feelings during therapy sessions, journaling, maintaining gratitude journal, and increased communication with husband have contributed to stability in mood. Patient reported she has been getting headaches frequently and feels this is due to the medication ozepmic. Patient reported participation in therapy, changes in patient's perception, changes in patient's response to situations/conversations, and being more  attentive to her husband during conversations have contributed to improvement in communication with husband. Patient stated, "I've always tried to keep that in the back of my mind" in response to use of "I" statements. Patient identified several entries from her gratitude journal, such as, seeing a movie she wanted to see, walked around [employer] and spent that time talking with husband, husband and patient holding hands. Patient stated, "my goal is to leave the negativity behind".   Interventions: Cognitive Behavioral Therapy. Clinician conducted session in person from clinician's office at [facility]. Reviewed events since last session. Discussed recent stability in mood and identified contributing factors to stability in mood. Discussed patient having a conversation with her physician to discuss recent headaches. Explored and identified contributing factors to improvement in communication between patient and husband. Provided psycho education related to the use of "I" statements. Reviewed patient's gratitude journal and thought record. Clinician requested for homework patient practice utilizing socratic questions to challenge thought distortions. Clinician provided referral information for marital counseling.     Diagnosis: Major Depressive Disorder, recurrent, in partial remission   Plan: Patient is to utilize [employer] [facility], thought re-framing, relaxation techniques, effective communication strategies, dialectical behavior therapy skills, and coping strategies to decrease symptoms associated with Major Depressive Disorder. Frequency: weekly  Modality: individual      Long-term goal:   Patient stated, "to be happy again" and "be a positive person".    Reduce overall level, frequency, and intensity of the feelings of depression as evidenced by decreased irritability, easily angered, increased appetite, difficulty getting out of bed due to fatigue, going to bed and  staying in bed after dinner, decrease in hygiene, sleeping late in the mornings, waking up in the middle of  the night multiple times, lack of energy, change in concentration, difficulty recalling information at work, taking longer to complete tasks, "melancholy" mood, loss of interest, feelings of guilt, apologizing frequently, and symptoms of anxiety from 7 days per week to 0 to 1 days per week per patient report for at least 1 month.   Target Date: 01/17/23  Progress: progressing    Short-term goal:  Verbalize an understanding of the relationship between depressive symptoms and patient's communication with husband per patient's report Target Date: 06/17/22  Progress: progressing    Develop effective communication strategies for patient to utilize when expressing her thoughts and feelings to others in a controlled and assertive way  Target Date: 06/17/22  Progress: progressing    Verbally express an understanding of the relationship between feelings of depression, anxiety and the impact on thought patterns and behaviors.   Target Date: 06/17/22  Progress: progressing    Identify triggers for anger/irritability and develop coping strategies to utilize in response to feelings of anger/irritability to reduce anger outbursts per patient's report    Target Date: 06/17/22  Progress: progressing    Identify, challenge, and replace negative thought patterns and negative self talk that contribute to feelings of depression and anxiety with positive thoughts, beliefs, and positive self talk per patient's report   Target Date: 06/17/22  Progress: progressing    Develop and implement emotion regulation skills to decrease fluctuations in mood, such as, anger, irritability, anxiety, and depression   Target Date: 06/17/22  Progress: progressing     Katherina Right, LCSW

## 2022-03-07 DIAGNOSIS — G4733 Obstructive sleep apnea (adult) (pediatric): Secondary | ICD-10-CM | POA: Diagnosis not present

## 2022-03-19 ENCOUNTER — Encounter: Payer: Self-pay | Admitting: Psychiatry

## 2022-03-19 ENCOUNTER — Ambulatory Visit (INDEPENDENT_AMBULATORY_CARE_PROVIDER_SITE_OTHER): Payer: BC Managed Care – PPO | Admitting: Psychiatry

## 2022-03-19 VITALS — BP 118/79 | HR 79 | Temp 97.8°F | Ht 64.0 in | Wt 244.2 lb

## 2022-03-19 DIAGNOSIS — F3342 Major depressive disorder, recurrent, in full remission: Secondary | ICD-10-CM

## 2022-03-19 DIAGNOSIS — F4010 Social phobia, unspecified: Secondary | ICD-10-CM

## 2022-03-19 DIAGNOSIS — F4000 Agoraphobia, unspecified: Secondary | ICD-10-CM | POA: Diagnosis not present

## 2022-03-19 DIAGNOSIS — G4701 Insomnia due to medical condition: Secondary | ICD-10-CM | POA: Insufficient documentation

## 2022-03-19 DIAGNOSIS — Z634 Disappearance and death of family member: Secondary | ICD-10-CM

## 2022-03-19 MED ORDER — SERTRALINE HCL 100 MG PO TABS: 100.0000 mg | ORAL_TABLET | Freq: Every day | ORAL | 0 refills | Status: DC

## 2022-03-19 MED ORDER — BUPROPION HCL 75 MG PO TABS: 75.0000 mg | ORAL_TABLET | Freq: Every morning | ORAL | 0 refills | Status: DC

## 2022-03-19 NOTE — Progress Notes (Signed)
BH MD OP Progress Note  03/19/2022 9:44 AM Nicole Cervantes  MRN:  [id number]  Chief Complaint:  Chief Complaint  Patient presents with   Follow-up   Anxiety   Depression   Medication Refill   HPI: Nicole Cervantes is a 47 year old Caucasian female, married, employed, lives in [place], has a history of MDD, agoraphobia, anxiety disorder, bulimia, hypertension, diabetes mellitus was evaluated in office today.  Patient today reports she is currently doing well with regards to her mood.  Denies any significant sadness, lack of motivation.  She feels much better on the higher dosage of Zoloft.  She denies any significant anxiety symptoms and reports she is trying to get out more and feels like she can cope with her anxiety better.  Patient reports sleep continues to be a problem.  She however had sleep study completed in January.  She was referred by her neurologist Dr. Sherryll Burger.  She reports she was diagnosed with mild sleep apnea and is currently awaiting CPAP.  Patient does report some appetite changes, has been cutting back on eating sweets.  Currently on Ozempic which also likely could be affecting her appetite.  She also reports mild headaches likely from the Ozempic, agreeable to discuss with her provider.  Patient currently denies any suicidality, homicidality or perceptual disturbances.  Continues to follow-up with her therapist.  Denies any other concerns today.  Visit Diagnosis:    ICD-10-CM   1. Recurrent major depressive disorder, in full remission (HCC)  F33.42 sertraline (ZOLOFT) 100 MG tablet    buPROPion (WELLBUTRIN) 75 MG tablet    2. Agoraphobia  F40.00 sertraline (ZOLOFT) 100 MG tablet    3. Social anxiety disorder  F40.10     4. Insomnia due to medical condition  G47.01    Sleep apnea, currently not on CPAP, mood symptoms.    5. Bereavement  Z63.4       Past Psychiatric History: Reviewed past psychiatric history from progress note on 11/28/2021.  Past also medications  like Paxil, Lamictal, Wellbutrin, Seroquel.   Past Medical History:  Past Medical History:  Diagnosis Date   Allergy    Anxiety    Back pain    Depression    Diabetes mellitus without complication (HCC)    Hypertension    DR. GRANDIS - [facility]   Migraines     Past Surgical History:  Procedure Laterality Date   ABDOMINAL HYSTERECTOMY  06/15/2007   IN [place], [place] - TOTAL EXCEPT OVARIES, LEIOMYOMA   COLONOSCOPY  2006   DX   Cyst removal to lower back     LAPAROSCOPY  2000   BURNED HOLES IN OVARIES TO RELEASE PRESSURE FROM CYSTS.  DR. BURNS, [place]   OVARIAN CYST REMOVAL     SKIN BIOPSY     TONSILLECTOMY      Family Psychiatric History: Reviewed family psychiatric history from progress note on 11/28/2021.  Family History:  Family History  Problem Relation Age of Onset   Diabetes Mother        passed away 06-14-2021   Hypertension Mother    Depression Mother    Stroke Mother    Throat cancer Mother    Alcohol abuse Father    Prostate cancer Father 89   Diabetes Father    Hypertension Father    Bipolar disorder Sister    Bladder Cancer Sister 31       in remission   Lupus Sister    Bipolar disorder Sister  Depression Maternal Aunt    Depression Maternal Uncle    Alcohol abuse Maternal Grandfather    Depression Maternal Grandfather    Alcohol abuse Maternal Grandmother    Heart disease Maternal Grandmother    Depression Maternal Grandmother    Bipolar disorder Other     Social History: Reviewed social history from progress note on 11/28/2021. Social History   Socioeconomic History   Marital status: Married    Spouse name: Not on file   Number of children: 1   Years of education: 14   Highest education level: Associate degree: academic program  Occupational History    Employer: [employer]  Tobacco Use   Smoking status: Former    Years: 20.00    Types: Cigarettes    Quit date: 2009    Years since quitting: 15.1   Smokeless tobacco: Never   Vaping Use   Vaping Use: Never used  Substance and Sexual Activity   Alcohol use: Yes    Comment: occasionally once a month   Drug use: No   Sexual activity: Yes    Birth control/protection: Surgical    Comment: Hysterectomy   Other Topics Concern   Not on file  Social History Narrative   Not on file   Social Determinants of Health   Financial Resource Strain: Not on file  Food Insecurity: Not on file  Transportation Needs: Not on file  Physical Activity: Not on file  Stress: Not on file  Social Connections: Not on file    Allergies: No Known Allergies  Metabolic Disorder Labs: Lab Results  Component Value Date   HGBA1C 6.8 (H) 02/18/2022   MPG 117 10/25/2015   No results found for: "PROLACTIN" Lab Results  Component Value Date   CHOL 210 (H) 02/18/2022   TRIG 161.0 (H) 02/18/2022   HDL 44.20 02/18/2022   CHOLHDL 5 02/18/2022   VLDL 32.2 02/18/2022   LDLCALC 133 (H) 02/18/2022   LDLCALC 121 (H) 11/08/2021   Lab Results  Component Value Date   TSH 2.19 11/08/2021   TSH 4.845 (H) 10/25/2015    Therapeutic Level Labs: No results found for: "LITHIUM" No results found for: "VALPROATE" No results found for: "CBMZ"  Current Medications: Current Outpatient Medications  Medication Sig Dispense Refill   ACCU-CHEK GUIDE test strip USE TO CHECK BLOOD SUGAR UP TO 4 TIMES DAILY AS DIRECTED     Accu-Chek Softclix Lancets lancets SMARTSIG:Topical 1-4 Times Daily     ALPRAZolam (XANAX) 0.5 MG tablet Take 0.5 mg by mouth daily as needed.     amLODipine (NORVASC) 10 MG tablet Take 1 tablet (10 mg total) by mouth daily. 90 tablet 1   atorvastatin (LIPITOR) 10 MG tablet Take 1 tablet (10 mg total) by mouth daily. 90 tablet 3   blood glucose meter kit and supplies Dispense based on patient and insurance preference. Use up to four times daily as directed. (FOR ICD-10 E10.9, E11.9). 1 each 0   clotrimazole-betamethasone (LOTRISONE) cream Apply externally BID prn sx up to 2  wks 15 g 1   famotidine (PEPCID) 20 MG tablet Take 1 tablet (20 mg total) by mouth daily as needed.     loratadine (CLARITIN) 10 MG tablet Take 1 tablet by mouth daily.     losartan (COZAAR) 50 MG tablet Take 1 tablet (50 mg total) by mouth daily. 90 tablet 1   metFORMIN (GLUCOPHAGE) 500 MG tablet Take 1 tablet (500 mg total) by mouth 2 (two) times daily with a meal.  180 tablet 3   omeprazole (PRILOSEC OTC) 20 MG tablet Take by mouth.     ondansetron (ZOFRAN-ODT) 4 MG disintegrating tablet Take 1 tablet (4 mg total) by mouth every 8 (eight) hours as needed for nausea or vomiting. 20 tablet 0   OZEMPIC, 0.25 OR 0.5 MG/DOSE, 2 MG/3ML SOPN Inject into the skin.     rOPINIRole (REQUIP) 0.25 MG tablet      Semaglutide,0.25 or 0.5MG /DOS, (OZEMPIC, 0.25 OR 0.5 MG/DOSE,) 2 MG/1.5ML SOPN Inject 0.25 mg into the skin once a week for 30 days, THEN 0.5 mg once a week. 2.25 mL 0   SUMAtriptan (IMITREX) 100 MG tablet Take by mouth.     triamcinolone cream (KENALOG) 0.1 % Apply 1 Application topically 2 (two) times daily as needed.     buPROPion (WELLBUTRIN) 75 MG tablet Take 1 tablet (75 mg total) by mouth in the morning. 90 tablet 0   sertraline (ZOLOFT) 100 MG tablet Take 1 tablet (100 mg total) by mouth daily. 90 tablet 0   No current facility-administered medications for this visit.     Musculoskeletal: Strength & Muscle Tone: within normal limits Gait & Station: normal Patient leans: N/A  Psychiatric Specialty Exam: Review of Systems  Psychiatric/Behavioral:  Positive for sleep disturbance.   All other systems reviewed and are negative.   Blood pressure 118/79, pulse 79, temperature 97.8 F (36.6 C), temperature source Oral, height 5\' 4"  (1.626 m), weight 244 lb 3.2 oz (110.8 kg).Body mass index is 41.92 kg/m.  General Appearance: Casual  Eye Contact:  Fair  Speech:  Clear and Coherent  Volume:  Normal  Mood:  Euthymic  Affect:  Congruent  Thought Process:  Goal Directed and Descriptions  of Associations: Intact  Orientation:  Full (Time, Place, and Person)  Thought Content: Logical   Suicidal Thoughts:  No  Homicidal Thoughts:  No  Memory:  Immediate;   Fair Recent;   Fair Remote;   Good  Judgement:  Fair  Insight:  Fair  Psychomotor Activity:  Normal  Concentration:  Concentration: Fair and Attention Span: Fair  Recall:  [employer] of Knowledge: Fair  Language: Fair  Akathisia:  No  Handed:  Right  AIMS (if indicated): not done  Assets:  Communication Skills Desire for Improvement Housing Social Support  ADL's:  Intact  Cognition: WNL  Sleep:  Poor   Screenings: AUDIT    Flowsheet Row Admission (Discharged) from 10/24/2015 in [facility]  Alcohol Use Disorder Identification Test Final Score (AUDIT) 0      GAD-7    Flowsheet Row Office Visit from 03/19/2022 in [facility] Office Visit from 11/28/2021 in [facility] Office Visit from 11/05/2021 in [facility]  Total GAD-7 Score 0 4 9      PHQ2-9    Flowsheet Row Office Visit from 03/19/2022 in [facility] Video Visit from 01/30/2022 in [facility] Office Visit from 11/28/2021 in [facility] Office Visit from 11/05/2021 in [facility] [place]  PHQ-2 Total Score 1 1 5 5   PHQ-9 Total Score 4 6 15 17       Flowsheet Row Office Visit from 03/19/2022 in [facility] Video Visit from 01/30/2022 in [facility] Office Visit from 11/28/2021 in [facility]  C-SSRS  RISK CATEGORY Low Risk Low Risk Low Risk        Assessment and Plan: Nicole Cervantes is a 47 year old Caucasian female, employed, married, lives in  [place], has a history of depression, anxiety, multiple medical problems was evaluated in office today.  Patient is currently stable with regards to her mood however does have sleep problems, recent diagnosis of sleep apnea currently not on CPAP, will benefit from the following plan.  Plan MDD in remission Sertraline 100 mg p.o. daily Wellbutrin 75 mg p.o. daily Continue CBT with Ms. Harriette Ohara  Agoraphobia-improving Continue CBT Does have Xanax available prescribed for vertigo by her provider however also helps with anxiety, limiting use.  Bereavement-improving Continue CBT  Social anxiety disorder-improving Sertraline 100 mg p.o. daily in the morning Continue CBT  Insomnia-unstable Patient encouraged to start using CPAP, she is awaiting her next appointment with her sleep provider. Patient also has upcoming appointment with neurology who reported her for sleep study. Patient to continue sleep hygiene techniques. She is also trying to lose weight which may also help with her current obstructive sleep apnea diagnosis, currently on Ozempic.  Patient advised to follow up with her provider who is prescribing her Ozempic for concerns of headaches.  Follow-up in clinic in 3 months or sooner if needed.  This note was generated in part or whole with voice recognition software. Voice recognition is usually quite accurate but there are transcription errors that can and very often do occur. I apologize for any typographical errors that were not detected and corrected.     Jomarie Longs, MD 03/20/2022, 10:06 AM

## 2022-03-21 ENCOUNTER — Other Ambulatory Visit: Payer: Self-pay | Admitting: Family

## 2022-03-21 DIAGNOSIS — E119 Type 2 diabetes mellitus without complications: Secondary | ICD-10-CM

## 2022-03-24 NOTE — Telephone Encounter (Signed)
Confirmed with the patient and she is taking the 0.1m dose of Ozempic.

## 2022-03-24 NOTE — Telephone Encounter (Signed)
Can we call pt to verify ozempic dose  I tried to reach out via mychart, no response. Thanks!

## 2022-03-25 ENCOUNTER — Ambulatory Visit: Payer: BC Managed Care – PPO | Admitting: Clinical

## 2022-04-01 ENCOUNTER — Ambulatory Visit: Payer: BC Managed Care – PPO | Admitting: Clinical

## 2022-04-01 DIAGNOSIS — G4733 Obstructive sleep apnea (adult) (pediatric): Secondary | ICD-10-CM | POA: Diagnosis not present

## 2022-04-09 ENCOUNTER — Ambulatory Visit: Payer: BC Managed Care – PPO | Admitting: Clinical

## 2022-04-09 DIAGNOSIS — F3341 Major depressive disorder, recurrent, in partial remission: Secondary | ICD-10-CM | POA: Diagnosis not present

## 2022-04-09 NOTE — Progress Notes (Signed)
                Asna Muldrow, LCSW 

## 2022-04-09 NOTE — Progress Notes (Signed)
[place] Counselor/Therapist Progress Note  Patient ID: Nicole Cervantes, MRN: [id number],    Date: 04/09/2022  Time Spent: 12:35pm - 1:23pm : 48 minutes   Treatment Type: Individual Therapy  Reported Symptoms: Patient reported recently feeling "drained" and reported some irritability  Mental Status Exam: Appearance:  Well Groomed     Behavior: Appropriate  Motor: Normal  Speech/Language:  Clear and Coherent  Affect: Appropriate  Mood: normal  Thought process: normal  Thought content:   WNL  Sensory/Perceptual disturbances:   WNL  Orientation: oriented to person, place, and situation  Attention: Good  Concentration: Good  Memory: WNL  Fund of knowledge:  Good  Insight:   Good  Judgment:  Good  Impulse Control: Good   Risk Assessment: Danger to Self:  No Patient denied current suicidal ideation  Self-injurious Behavior: No Danger to Others: No Patient denied current homicidal ideation  Duty to Warn:no Physical Aggression / Violence:No  Access to Firearms a concern: No  Gang Involvement:No   Subjective: Patient reported she received a CPAP machine and has an appointment to obtain a new mask. Patient reported the mask is waking her up at night.  Patient stated, "everything is good" in response to events since last session. Patient reported she received multiple surprises at work for her birthday and reported her family threw her a surprise birthday party. Patient reported a positive response to birthday celebrations. Patient reported her husband is currently out of work due to medical issues. Patient stated, "only one time it stressed me out" in response to husband  being out of work. Patient reported she stepped out of the room and then returned to discuss her concerns with her husband when he asked patient for financial assistance with the car payment. Patient stated, "really good" in response to the outcome of the conversation with husband to discuss financial  stressor. Patient stated, "for the most part its been good" in response to mood since last session. Patient reported feeling "drained" and some irritability over the past several days. Patient reported difficulty falling back asleep last night. Patient stated, "good" in response to progress of gratitude journal and reported she continues to maintain her gratitude journal. Patient reported she thought about the socratic questions in response to thoughts about her neighbor. Patient stated, "I'm proud of myself" in regards to her response to recent stressors.   Interventions: Cognitive Behavioral Therapy.  Clinician conducted session in person from clinician's office at [facility]. Reviewed events since last session. Provided active and reflective listening, and validation as patient discussed husband's recent medical issues, husband being out of work, and patient's response to the situation. Discussed the impact of recent stressors on patient's mood. Explored and identified coping strategies patient implemented in response to recent stressors. Provided psycho education related to sleep and impact on mood. Reviewed socratic questions for homework. Validated patient's use of coping strategies, gratitude journal, and socratic questions. Clinician requested for homework patient continue to practice utilizing socratic questions to challenge thought distortions.      Diagnosis: Major Depressive Disorder, recurrent, in partial remission   Plan: Patient is to utilize [employer] Therapy, thought re-framing, relaxation techniques, effective communication strategies, dialectical behavior therapy skills, and coping strategies to decrease symptoms associated with Major Depressive Disorder. Frequency: weekly  Modality: individual      Long-term goal:   Patient stated, "to be happy again" and "be a positive person".    Reduce overall level, frequency, and intensity of the feelings of  depression as  evidenced by decreased irritability, easily angered, increased appetite, difficulty getting out of bed due to fatigue, going to bed and staying in bed after dinner, decrease in hygiene, sleeping late in the mornings, waking up in the middle of the night multiple times, lack of energy, change in concentration, difficulty recalling information at work, taking longer to complete tasks, "melancholy" mood, loss of interest, feelings of guilt, apologizing frequently, and symptoms of anxiety from 7 days per week to 0 to 1 days per week per patient report for at least 1 month.   Target Date: 01/17/23  Progress: progressing    Short-term goal:  Verbalize an understanding of the relationship between depressive symptoms and patient's communication with husband per patient's report Target Date: 06/17/22  Progress: progressing    Develop effective communication strategies for patient to utilize when expressing her thoughts and feelings to others in a controlled and assertive way  Target Date: 06/17/22  Progress: progressing    Verbally express an understanding of the relationship between feelings of depression, anxiety and the impact on thought patterns and behaviors.   Target Date: 06/17/22  Progress: progressing    Identify triggers for anger/irritability and develop coping strategies to utilize in response to feelings of anger/irritability to reduce anger outbursts per patient's report    Target Date: 06/17/22  Progress: progressing    Identify, challenge, and replace negative thought patterns and negative self talk that contribute to feelings of depression and anxiety with positive thoughts, beliefs, and positive self talk per patient's report   Target Date: 06/17/22  Progress: progressing    Develop and implement emotion regulation skills to decrease fluctuations in mood, such as, anger, irritability, anxiety, and depression   Target Date: 06/17/22  Progress: progressing     Katherina Right,  LCSW

## 2022-04-30 DIAGNOSIS — G4733 Obstructive sleep apnea (adult) (pediatric): Secondary | ICD-10-CM | POA: Diagnosis not present

## 2022-05-05 ENCOUNTER — Other Ambulatory Visit: Payer: Self-pay | Admitting: Family

## 2022-05-05 DIAGNOSIS — I10 Essential (primary) hypertension: Secondary | ICD-10-CM

## 2022-05-08 ENCOUNTER — Ambulatory Visit: Payer: BC Managed Care – PPO | Admitting: Clinical

## 2022-05-08 DIAGNOSIS — F3341 Major depressive disorder, recurrent, in partial remission: Secondary | ICD-10-CM | POA: Diagnosis not present

## 2022-05-08 NOTE — Progress Notes (Signed)
                Jillian Pianka, LCSW 

## 2022-05-08 NOTE — Progress Notes (Signed)
[facility] Counselor/Therapist Progress Note  Patient ID: BRIDIE BOATMAN, MRN: [id number],    Date: 05/08/2022  Time Spent: 12:34pm - 1:31pm : 57 minutes   Treatment Type: Individual Therapy  Reported Symptoms: Patient stated, "good, really good" in response to patient's mood since last session.   Mental Status Exam: Appearance:  Well Groomed     Behavior: Appropriate  Motor: Normal  Speech/Language:  Clear and Coherent  Affect: Appropriate  Mood: normal  Thought process: normal  Thought content:   WNL  Sensory/Perceptual disturbances:   WNL  Orientation: oriented to person, place, and situation  Attention: Good  Concentration: Good  Memory: WNL  Fund of knowledge:  Good  Insight:   Good  Judgment:  Good  Impulse Control: Good   Risk Assessment: Danger to Self:  No Patient denied current suicidal ideation  Self-injurious Behavior: No Danger to Others: No Patient denied current homicidal ideation  Duty to Warn:no Physical Aggression / Violence:No  Access to Firearms a concern: No  Gang Involvement:No   Subjective: Patient stated, "a lot's been going on" in response to events since last session. Patient reported her daughter had a baby recently and patient was able to visit her daughter/grandson in the hospital. Patient reported she was able to see her father during her recent visit to [facility] and feels he is doing well. Patient reported her recent trip to [place] was her first visit since her mother's death. Patient reported her relationship with her sister has improved since her mother's death. Patient reported patient and her sister competed for their mother's affection. Patient reported she and her sister had a conversation to discuss and resolve the issues in their relationship. Patient reported her husband is still out of work and is still experiencing difficulty walking. Patient reported her husband plans to inquire about long term disability. Patient  reported the house she currently rents was put up for sale and reported the situation is a current stressor. Patient reported they attempted to purchase the home but was out bid and is searching for another home to purchase. Patient reported she does not want to move and stated, "Im just more passive about it than I should be".   Interventions: Cognitive Behavioral Therapy and supportive therapy . Clinician conducted session in person from clinician's office at [facility]. Reviewed events since last session. Provided active and reflective listening, and validation as patient discussed her recent trip to visit family and the difference in her father's physical/mental health and patient's relationship with her sister since her mother's death. Discussed the status of previous stressors, discussed additional stressors and patient's response to stressors. Assessed mood since last session. Clinician requested for homework patient continue to practice utilizing socratic questions to challenge thought distortions and gratitude journal.   Diagnosis: Major Depressive Disorder, recurrent, in partial remission   Plan: Patient is to utilize Cognitive Behavioral Therapy, thought re-framing, relaxation techniques, effective communication strategies, dialectical behavior therapy skills, and coping strategies to decrease symptoms associated with Major Depressive Disorder. Frequency: weekly  Modality: individual      Long-term goal:   Patient stated, "to be happy again" and "be a positive person".    Reduce overall level, frequency, and intensity of the feelings of depression as evidenced by decreased irritability, easily angered, increased appetite, difficulty getting out of bed due to fatigue, going to bed and staying in bed after dinner, decrease in hygiene, sleeping late in the mornings, waking up in the middle of the night multiple  times, lack of energy, change in concentration, difficulty recalling  information at work, taking longer to complete tasks, "melancholy" mood, loss of interest, feelings of guilt, apologizing frequently, and symptoms of anxiety from 7 days per week to 0 to 1 days per week per patient report for at least 1 month.   Target Date: 01/17/23  Progress: progressing    Short-term goal:  Verbalize an understanding of the relationship between depressive symptoms and patient's communication with husband per patient's report Target Date: 06/17/22  Progress: progressing    Develop effective communication strategies for patient to utilize when expressing her thoughts and feelings to others in a controlled and assertive way  Target Date: 06/17/22  Progress: progressing    Verbally express an understanding of the relationship between feelings of depression, anxiety and the impact on thought patterns and behaviors.   Target Date: 06/17/22  Progress: progressing    Identify triggers for anger/irritability and develop coping strategies to utilize in response to feelings of anger/irritability to reduce anger outbursts per patient's report    Target Date: 06/17/22  Progress: progressing    Identify, challenge, and replace negative thought patterns and negative self talk that contribute to feelings of depression and anxiety with positive thoughts, beliefs, and positive self talk per patient's report   Target Date: 06/17/22  Progress: progressing    Develop and implement emotion regulation skills to decrease fluctuations in mood, such as, anger, irritability, anxiety, and depression   Target Date: 06/17/22  Progress: progressing     Katherina Right, LCSW

## 2022-05-15 ENCOUNTER — Encounter: Payer: Self-pay | Admitting: Family

## 2022-05-15 ENCOUNTER — Ambulatory Visit: Payer: BC Managed Care – PPO | Admitting: Family

## 2022-05-15 VITALS — BP 126/78 | HR 70 | Temp 97.9°F | Ht 64.0 in | Wt 246.7 lb

## 2022-05-15 DIAGNOSIS — F411 Generalized anxiety disorder: Secondary | ICD-10-CM

## 2022-05-15 DIAGNOSIS — H60531 Acute contact otitis externa, right ear: Secondary | ICD-10-CM

## 2022-05-15 DIAGNOSIS — R0789 Other chest pain: Secondary | ICD-10-CM

## 2022-05-15 DIAGNOSIS — R12 Heartburn: Secondary | ICD-10-CM | POA: Diagnosis not present

## 2022-05-15 DIAGNOSIS — F331 Major depressive disorder, recurrent, moderate: Secondary | ICD-10-CM

## 2022-05-15 DIAGNOSIS — I10 Essential (primary) hypertension: Secondary | ICD-10-CM

## 2022-05-15 MED ORDER — OMEPRAZOLE 40 MG PO CPDR: 40.0000 mg | DELAYED_RELEASE_CAPSULE | Freq: Every day | ORAL | 0 refills | Status: DC

## 2022-05-15 MED ORDER — SERTRALINE HCL 100 MG PO TABS: ORAL_TABLET | ORAL | 1 refills | Status: DC

## 2022-05-15 MED ORDER — CIPROFLOXACIN-DEXAMETHASONE 0.3-0.1 % OT SUSP: 4.0000 [drp] | Freq: Two times a day (BID) | OTIC | 0 refills | Status: AC

## 2022-05-15 NOTE — Progress Notes (Signed)
Established Patient Office Visit  Subjective:      CC:  Chief Complaint  Patient presents with   Hypertension   Chest Pain    A week or two   Dizziness    Started two days ago    HPI: Nicole Cervantes is a 47 y.o. female presenting on 05/15/2022 for Hypertension, Chest Pain (A week or two), and Dizziness (Started two days ago) . HTN: she states that over the last few days with elevated blood pressure 140-155/90-99. She does state that she has had some chest discomfort on her upper chest base of neck. No sob or breathing concerns. Denies palpitations. She does feel the chest discomfort right now feels like heaviness, not painful. Doesn't fele like heartburn, finds it is intermittent but does last for long  periods and then will ease up a little bit as well. Se does also report some dizziness, she feels the dizziness occurs with the chest heaviness.   She did check her glucose at home and the sugar was 98 which was fasting which was this am. Yesterday fasting was 110.   She does report some slight nausea, denies vomiting.   She does have Hld, HTN (with amlodipine 10 mg losartan 50 mg once daily.  She did start ozempic a few months ago , tried to increase to 0.5 but didn't like how it made her feel, so a few weeks ago decreased again to 0.25 mg weekly. The increased dose gave her some diarrhea and stomach cramps which has since resolved. No constipation, regular bm now EKG 2022 NSR did show RBBB  Does report a lot of stress, the house she has been renting recently told them they have two months to move which is adding to the stress. She is on sertraline 100 mg once daily.    Lab Results  Component Value Date   HGBA1C 6.8 (H) 02/18/2022     Social history:  Relevant past medical, surgical, family and social history reviewed and updated as indicated. Interim medical history since our last visit reviewed.  Allergies and medications reviewed and updated.  DATA REVIEWED: CHART IN  EPIC     ROS: Negative unless specifically indicated above in HPI.    Current Outpatient Medications:    ACCU-CHEK GUIDE test strip, USE TO CHECK BLOOD SUGAR UP TO 4 TIMES DAILY AS DIRECTED, Disp: , Rfl:    Accu-Chek Softclix Lancets lancets, SMARTSIG:Topical 1-4 Times Daily, Disp: , Rfl:    ALPRAZolam (XANAX) 0.5 MG tablet, Take 0.5 mg by mouth daily as needed., Disp: , Rfl:    amLODipine (NORVASC) 10 MG tablet, Take 1 tablet (10 mg total) by mouth daily., Disp: 90 tablet, Rfl: 1   atorvastatin (LIPITOR) 10 MG tablet, Take 1 tablet (10 mg total) by mouth daily., Disp: 90 tablet, Rfl: 3   blood glucose meter kit and supplies, Dispense based on patient and insurance preference. Use up to four times daily as directed. (FOR ICD-10 E10.9, E11.9)., Disp: 1 each, Rfl: 0   buPROPion (WELLBUTRIN) 75 MG tablet, Take 1 tablet (75 mg total) by mouth in the morning., Disp: 90 tablet, Rfl: 0   ciprofloxacin-dexamethasone (CIPRODEX) OTIC suspension, Place 4 drops into the right ear 2 (two) times daily for 10 days., Disp: 4 mL, Rfl: 0   clotrimazole-betamethasone (LOTRISONE) cream, Apply externally BID prn sx up to 2 wks, Disp: 15 g, Rfl: 1   famotidine (PEPCID) 20 MG tablet, Take 1 tablet (20 mg total) by mouth daily as  needed., Disp: , Rfl:    loratadine (CLARITIN) 10 MG tablet, Take 1 tablet by mouth daily., Disp: , Rfl:    losartan (COZAAR) 50 MG tablet, Take 1 tablet by mouth once daily, Disp: 90 tablet, Rfl: 3   metFORMIN (GLUCOPHAGE) 500 MG tablet, Take 1 tablet (500 mg total) by mouth 2 (two) times daily with a meal., Disp: 180 tablet, Rfl: 3   omeprazole (PRILOSEC) 40 MG capsule, Take 1 capsule (40 mg total) by mouth daily for 14 days., Disp: 14 capsule, Rfl: 0   ondansetron (ZOFRAN-ODT) 4 MG disintegrating tablet, Take 1 tablet (4 mg total) by mouth every 8 (eight) hours as needed for nausea or vomiting., Disp: 20 tablet, Rfl: 0   rOPINIRole (REQUIP) 0.25 MG tablet, , Disp: , Rfl:     Semaglutide,0.25 or 0.5MG /DOS, (OZEMPIC, 0.25 OR 0.5 MG/DOSE,) 2 MG/3ML SOPN, Inject 0.5 mg into the skin once a week., Disp: 3 mL, Rfl: 5   sertraline (ZOLOFT) 100 MG tablet, Take two tablets once daily, Disp: 180 tablet, Rfl: 1   SUMAtriptan (IMITREX) 100 MG tablet, Take by mouth., Disp: , Rfl:    triamcinolone cream (KENALOG) 0.1 %, Apply 1 Application topically 2 (two) times daily as needed., Disp: , Rfl:       Objective:    BP 126/78 (BP Location: Left Arm)   Pulse 70   Temp 97.9 F (36.6 C) (Temporal)   Ht 5\' 4"  (1.626 m)   Wt 246 lb 11.2 oz (111.9 kg)   SpO2 98%   BMI 42.35 kg/m   Wt Readings from Last 3 Encounters:  05/15/22 246 lb 11.2 oz (111.9 kg)  02/11/22 255 lb (115.7 kg)  11/11/21 256 lb 8 oz (116.3 kg)    Physical Exam Constitutional:      General: She is not in acute distress.    Appearance: Normal appearance. She is obese. She is not ill-appearing, toxic-appearing or diaphoretic.  HENT:     Head: Normocephalic.     Ears:     Comments: Right canal with internal swelling Mild clear effusion right tm Cardiovascular:     Rate and Rhythm: Normal rate.  Pulmonary:     Effort: Pulmonary effort is normal.  Abdominal:     General: Bowel sounds are normal.     Tenderness: There is no abdominal tenderness. There is no guarding.  Musculoskeletal:        General: Normal range of motion.  Neurological:     General: No focal deficit present.     Mental Status: She is alert and oriented to person, place, and time. Mental status is at baseline.  Psychiatric:        Mood and Affect: Mood normal.        Behavior: Behavior normal.        Thought Content: Thought content normal.        Judgment: Judgment normal.            Assessment & Plan:  Chest discomfort Assessment & Plan: EKG in office  NSR  Acute ischemia less likely  Did advise pt on er/uc precautions and when to seek more urgent care   Orders: -     EKG 12-Lead  Heartburn Assessment &  Plan: Suspect heartburn related to GLP1  Advised pt to d/c ozempic and see if symptoms improve Advised pt to increase metformin 500 mg to twice daily as decreasing ozempic  Rx omeprazole 40 mg once daily for two weeks Try to decrease and  or avoid spicy foods, fried fatty foods, and also caffeine and chocolate as these can increase heartburn symptoms.     Orders: -     Omeprazole; Take 1 capsule (40 mg total) by mouth daily for 14 days.  Dispense: 14 capsule; Refill: 0  Acute contact otitis externa of right ear Assessment & Plan: Rx cortisporin   Orders: -     Ciprofloxacin-dexAMETHasone; Place 4 drops into the right ear 2 (two) times daily for 10 days.  Dispense: 4 mL; Refill: 0  Generalized anxiety disorder -     Sertraline HCl; Take two tablets once daily  Dispense: 180 tablet; Refill: 1  Primary hypertension Assessment & Plan: Controlled in office.  Pt to fill out blood pressure log at home and bring to next appt  Did advise pt to bring blood pressure cuff with her to compare   MDD (major depressive disorder), recurrent episode, moderate Assessment & Plan: Increase sertraline to 200 mg once daily F/u with psychiatry as scheduled.      No follow-ups on file.  Mort Sawyersabitha Brailen Macneal, MSN, APRN, FNP-C [facility]

## 2022-05-15 NOTE — Telephone Encounter (Signed)
Thank you for you're precautions given to pt, will see her as scheduled.

## 2022-05-15 NOTE — Assessment & Plan Note (Signed)
Suspect heartburn related to GLP1  Advised pt to d/c ozempic and see if symptoms improve Advised pt to increase metformin 500 mg to twice daily as decreasing ozempic  Rx omeprazole 40 mg once daily for two weeks Try to decrease and or avoid spicy foods, fried fatty foods, and also caffeine and chocolate as these can increase heartburn symptoms.

## 2022-05-15 NOTE — Telephone Encounter (Signed)
I spoke with pt; pt said for last 3 days on and off have had episodes that last about an hour and then go away with lightheadedness, slight H/A and feels foggy. BS today is "perfect per pt." Pt has not missed taking amlodipine or losartan. Pt said also on and off for couple of weeks has discomfort mid chest that does not radiate any where. Pt does not have chest discomfort now. 05/14/22 BP 140/85; earlier this AM BP 158/90 and now BP 143/97 P 69. No SOB. [place] now for appt with T Dugal FNP 05/15/22 at 10:40. Pt said in no distress but would feel better to be seen. Sending note to Hayden Pedro FNP and [facility] pool.

## 2022-05-15 NOTE — Assessment & Plan Note (Signed)
Increase sertraline to 200 mg once daily F/u with psychiatry as scheduled.

## 2022-05-15 NOTE — Assessment & Plan Note (Signed)
Rx cortisporin.  

## 2022-05-15 NOTE — Assessment & Plan Note (Signed)
Controlled in office.  Pt to fill out blood pressure log at home and bring to next appt  Did advise pt to bring blood pressure cuff with her to compare

## 2022-05-15 NOTE — Patient Instructions (Addendum)
Stop ozempic  Start omeprazole 40 mg once daily for two weeks.  Increase water intake.   Start sertraline increase to two tablets once daily (total 200 mg once daily)  Start allergy medication at night time.   Regards,   Mort Sawyers FNP-C

## 2022-05-15 NOTE — Assessment & Plan Note (Addendum)
EKG in office  NSR  Acute ischemia less likely  Did advise pt on er/uc precautions and when to seek more urgent care

## 2022-05-20 DIAGNOSIS — F339 Major depressive disorder, recurrent, unspecified: Secondary | ICD-10-CM | POA: Diagnosis not present

## 2022-05-20 DIAGNOSIS — G4733 Obstructive sleep apnea (adult) (pediatric): Secondary | ICD-10-CM | POA: Diagnosis not present

## 2022-05-20 DIAGNOSIS — G43809 Other migraine, not intractable, without status migrainosus: Secondary | ICD-10-CM | POA: Diagnosis not present

## 2022-05-20 DIAGNOSIS — E1165 Type 2 diabetes mellitus with hyperglycemia: Secondary | ICD-10-CM | POA: Diagnosis not present

## 2022-05-21 ENCOUNTER — Ambulatory Visit: Payer: BC Managed Care – PPO | Admitting: Clinical

## 2022-05-21 DIAGNOSIS — F3341 Major depressive disorder, recurrent, in partial remission: Secondary | ICD-10-CM

## 2022-05-21 NOTE — Progress Notes (Signed)
                Kayline Sheer, LCSW 

## 2022-05-21 NOTE — Progress Notes (Signed)
[facility] Counselor/Therapist Progress Note  Patient ID: Nicole Cervantes, MRN: [id number],    Date: 05/21/2022  Time Spent: 12:33pm - 1: 27pm : 54 minutes    Treatment Type: Individual Therapy  Reported Symptoms: Patient reported difficulty sleeping.   Mental Status Exam: Appearance:  Well Groomed     Behavior: Appropriate  Motor: Normal  Speech/Language:  Clear and Coherent  Affect: Appropriate  Mood: normal  Thought process: normal  Thought content:   WNL  Sensory/Perceptual disturbances:   WNL  Orientation: oriented to person, place, and situation  Attention: Good  Concentration: Good  Memory: WNL  Fund of knowledge:  Good  Insight:   Good  Judgment:  Good  Impulse Control: Good   Risk Assessment: Danger to Self:  No Patient denied current suicidal ideation  Self-injurious Behavior: No Danger to Others: No Patient denied current homicidal ideation  Duty to Warn:no Physical Aggression / Violence:No  Access to Firearms a concern: No  Gang Involvement:No   Subjective: Patient reported their lease is up at the end of May and they have to move out of their home by the end of May. Patient reported she has an upcoming appointment to discuss purchasing a home. Patient reported difficulty finding other housing options due to the housing market. Patient stated, "I have my ups and downs" in response to the house search and stated, "I'm just over it". Patient stated, "I'm not sleeping well at all" and reported difficulty staying asleep. Patient reported they could move to an apartment but she is concerned about the financial impact. Patient stated, "I'm keeping my fingers crossed that everything works out". Patient reported her faith has been a source of support in response to searching for a home and their upcoming move. Patient stated, "I'm ok with that" in response to the anniversary of her mother's death tomorrow. Patient reported she has been thinking about the  anniversary and stated, "I'm not sad". Patient reported she is at a place of acceptance as it relates to her mother's death. Patient reported she plans to call her sister and her father tomorrow. Patient reported she continues to journal and stated,  "it is very helpful".   Interventions: Cognitive Behavioral Therapy and supportive therapy.   Clinician conducted session in person from clinician's office at [facility]. Reviewed events since last session. Discussed the status of previous stressors and the impact on patient's mood and sleep. Explored patient's housing options. Provided supportive therapy, active and reflective listening, and validation as patient discussed her thoughts/feelings related to the anniversary of her mother's death tomorrow. Assessed the status of patient journaling and patient's response. Provided validation related to patient's progress and patient's use of thought re-framing.   Collaboration of Care: Other is not required at this time.   Diagnosis: Major Depressive Disorder, recurrent, in partial remission   Plan: Patient is to utilize [employer] [facility], thought re-framing, relaxation techniques, effective communication strategies, dialectical behavior therapy skills, and coping strategies to decrease symptoms associated with Major Depressive Disorder. Frequency: weekly  Modality: individual      Long-term goal:   Patient stated, "to be happy again" and "be a positive person".    Reduce overall level, frequency, and intensity of the feelings of depression as evidenced by decreased irritability, easily angered, increased appetite, difficulty getting out of bed due to fatigue, going to bed and staying in bed after dinner, decrease in hygiene, sleeping late in the mornings, waking up in the middle of the night  multiple times, lack of energy, change in concentration, difficulty recalling information at work, taking longer to complete tasks, "melancholy"  mood, loss of interest, feelings of guilt, apologizing frequently, and symptoms of anxiety from 7 days per week to 0 to 1 days per week per patient report for at least 1 month.   Target Date: 01/17/23  Progress: progressing    Short-term goal:  Verbalize an understanding of the relationship between depressive symptoms and patient's communication with husband per patient's report Target Date: 06/17/22  Progress: progressing    Develop effective communication strategies for patient to utilize when expressing her thoughts and feelings to others in a controlled and assertive way  Target Date: 06/17/22  Progress: progressing    Verbally express an understanding of the relationship between feelings of depression, anxiety and the impact on thought patterns and behaviors.   Target Date: 06/17/22  Progress: progressing    Identify triggers for anger/irritability and develop coping strategies to utilize in response to feelings of anger/irritability to reduce anger outbursts per patient's report    Target Date: 06/17/22  Progress: progressing    Identify, challenge, and replace negative thought patterns and negative self talk that contribute to feelings of depression and anxiety with positive thoughts, beliefs, and positive self talk per patient's report   Target Date: 06/17/22  Progress: progressing    Develop and implement emotion regulation skills to decrease fluctuations in mood, such as, anger, irritability, anxiety, and depression   Target Date: 06/17/22  Progress: progressing     Doree Barthel, LCSW

## 2022-05-23 ENCOUNTER — Ambulatory Visit: Payer: BC Managed Care – PPO | Admitting: Family

## 2022-05-23 ENCOUNTER — Encounter: Payer: Self-pay | Admitting: Family

## 2022-05-23 VITALS — BP 126/78 | HR 88 | Temp 97.9°F | Ht 64.0 in | Wt 245.6 lb

## 2022-05-23 DIAGNOSIS — E119 Type 2 diabetes mellitus without complications: Secondary | ICD-10-CM | POA: Diagnosis not present

## 2022-05-23 DIAGNOSIS — E782 Mixed hyperlipidemia: Secondary | ICD-10-CM

## 2022-05-23 DIAGNOSIS — Z7984 Long term (current) use of oral hypoglycemic drugs: Secondary | ICD-10-CM | POA: Diagnosis not present

## 2022-05-23 DIAGNOSIS — I10 Essential (primary) hypertension: Secondary | ICD-10-CM

## 2022-05-23 DIAGNOSIS — L259 Unspecified contact dermatitis, unspecified cause: Secondary | ICD-10-CM | POA: Diagnosis not present

## 2022-05-23 DIAGNOSIS — Z79899 Other long term (current) drug therapy: Secondary | ICD-10-CM

## 2022-05-23 DIAGNOSIS — E876 Hypokalemia: Secondary | ICD-10-CM

## 2022-05-23 DIAGNOSIS — F331 Major depressive disorder, recurrent, moderate: Secondary | ICD-10-CM

## 2022-05-23 LAB — COMPREHENSIVE METABOLIC PANEL
ALT: 18 U/L (ref 0–35)
AST: 18 U/L (ref 0–37)
Albumin: 4 g/dL (ref 3.5–5.2)
Alkaline Phosphatase: 92 U/L (ref 39–117)
BUN: 10 mg/dL (ref 6–23)
CO2: 29 mEq/L (ref 19–32)
Calcium: 9.2 mg/dL (ref 8.4–10.5)
Chloride: 104 mEq/L (ref 96–112)
Creatinine, Ser: 0.74 mg/dL (ref 0.40–1.20)
GFR: 95.2 mL/min (ref >60.00)
Glucose, Bld: 105 mg/dL — ABNORMAL HIGH (ref 70–99)
Potassium: 4 mEq/L (ref 3.5–5.1)
Sodium: 140 mEq/L (ref 135–145)
Total Bilirubin: 0.3 mg/dL (ref 0.2–1.2)
Total Protein: 7.3 g/dL (ref 6.0–8.3)

## 2022-05-23 LAB — LIPID PANEL
Cholesterol: 130 mg/dL (ref 0–200)
HDL: 45.5 mg/dL (ref >39.00)
LDL Cholesterol: 63 mg/dL (ref 0–99)
NonHDL: 84.53
Total CHOL/HDL Ratio: 3
Triglycerides: 106 mg/dL (ref 0.0–149.0)
VLDL: 21.2 mg/dL (ref 0.0–40.0)

## 2022-05-23 LAB — HEMOGLOBIN A1C: Hgb A1c MFr Bld: 6.1 % (ref 4.6–6.5)

## 2022-05-23 MED ORDER — CLOBETASOL PROPIONATE 0.05 % EX CREA: 1.0000 | TOPICAL_CREAM | Freq: Two times a day (BID) | CUTANEOUS | 11 refills | Status: DC

## 2022-05-23 MED ORDER — AMLODIPINE BESYLATE 10 MG PO TABS: 10.0000 mg | ORAL_TABLET | Freq: Every day | ORAL | 1 refills | Status: DC

## 2022-05-23 NOTE — Progress Notes (Signed)
Established Patient Office Visit  Subjective:      CC:  Chief Complaint  Patient presents with   Medical Management of Chronic Issues    HPI: Nicole Cervantes is a 47 y.o. female presenting on 05/23/2022 for Medical Management of Chronic Issues . Heartburn and chest pain has resolved since 4/11 visit since she stopped the ozempic. She is no longer with dizziness either.  She states just the knowledge things are better have improved her anxiety.  We did increase her sertraline to 200 mg last visit as well.   Right ear pain, been using cipro-dex and this has been helpful.   DM2: recently stopped ozempic. Doing much better without side effects. Tolerating metformin well denies GI s/e takes 500 mg twice daily.  Lab Results  Component Value Date   HGBA1C 6.8 (H) 02/18/2022   Hld: recently started lipitor 10 mg nightly. Tolerating myalgias.  Lab Results  Component Value Date   CHOL 210 (H) 02/18/2022   HDL 44.20 02/18/2022   LDLCALC 133 (H) 02/18/2022   TRIG 161.0 (H) 02/18/2022   CHOLHDL 5 02/18/2022     New complaints: Allergies, 'driving her crazy' taking allegra. She is having sinus headaches and watery eyes. A little bit of a sore throat over the last few days. No cough or chest congestion.      Social history:  Relevant past medical, surgical, family and social history reviewed and updated as indicated. Interim medical history since our last visit reviewed.  Allergies and medications reviewed and updated.  DATA REVIEWED: CHART IN EPIC     ROS: Negative unless specifically indicated above in HPI.    Current Outpatient Medications:    ACCU-CHEK GUIDE test strip, USE TO CHECK BLOOD SUGAR UP TO 4 TIMES DAILY AS DIRECTED, Disp: , Rfl:    Accu-Chek Softclix Lancets lancets, SMARTSIG:Topical 1-4 Times Daily, Disp: , Rfl:    ALPRAZolam (XANAX) 0.5 MG tablet, Take 0.5 mg by mouth daily as needed., Disp: , Rfl:    atorvastatin (LIPITOR) 10 MG tablet, Take 1 tablet  (10 mg total) by mouth daily., Disp: 90 tablet, Rfl: 3   blood glucose meter kit and supplies, Dispense based on patient and insurance preference. Use up to four times daily as directed. (FOR ICD-10 E10.9, E11.9)., Disp: 1 each, Rfl: 0   buPROPion (WELLBUTRIN) 75 MG tablet, Take 1 tablet (75 mg total) by mouth in the morning., Disp: 90 tablet, Rfl: 0   ciprofloxacin-dexamethasone (CIPRODEX) OTIC suspension, Place 4 drops into the right ear 2 (two) times daily for 10 days., Disp: 4 mL, Rfl: 0   clobetasol cream (TEMOVATE) 0.05 %, Apply 1 Application topically 2 (two) times daily., Disp: 30 g, Rfl: 11   clotrimazole-betamethasone (LOTRISONE) cream, Apply externally BID prn sx up to 2 wks, Disp: 15 g, Rfl: 1   famotidine (PEPCID) 20 MG tablet, Take 1 tablet (20 mg total) by mouth daily as needed., Disp: , Rfl:    loratadine (CLARITIN) 10 MG tablet, Take 1 tablet by mouth daily., Disp: , Rfl:    losartan (COZAAR) 50 MG tablet, Take 1 tablet by mouth once daily, Disp: 90 tablet, Rfl: 3   metFORMIN (GLUCOPHAGE) 500 MG tablet, Take 1 tablet (500 mg total) by mouth 2 (two) times daily with a meal., Disp: 180 tablet, Rfl: 3   omeprazole (PRILOSEC) 40 MG capsule, Take 1 capsule (40 mg total) by mouth daily for 14 days., Disp: 14 capsule, Rfl: 0   ondansetron (ZOFRAN-ODT) 4 MG  disintegrating tablet, Take 1 tablet (4 mg total) by mouth every 8 (eight) hours as needed for nausea or vomiting., Disp: 20 tablet, Rfl: 0   rOPINIRole (REQUIP) 0.25 MG tablet, , Disp: , Rfl:    sertraline (ZOLOFT) 100 MG tablet, Take two tablets once daily, Disp: 180 tablet, Rfl: 1   SUMAtriptan (IMITREX) 100 MG tablet, Take by mouth., Disp: , Rfl:    triamcinolone cream (KENALOG) 0.1 %, Apply 1 Application topically 2 (two) times daily as needed., Disp: , Rfl:    amLODipine (NORVASC) 10 MG tablet, Take 1 tablet (10 mg total) by mouth daily., Disp: 90 tablet, Rfl: 1      Objective:    BP 126/78 (BP Location: Left Arm)   Pulse 88    Temp 97.9 F (36.6 C) (Temporal)   Ht 5\' 4"  (1.626 m)   Wt 245 lb 9.6 oz (111.4 kg)   SpO2 98%   BMI 42.16 kg/m   Wt Readings from Last 3 Encounters:  05/23/22 245 lb 9.6 oz (111.4 kg)  05/15/22 246 lb 11.2 oz (111.9 kg)  02/11/22 255 lb (115.7 kg)    Physical Exam Constitutional:      General: She is not in acute distress.    Appearance: Normal appearance. She is obese. She is not ill-appearing, toxic-appearing or diaphoretic.  HENT:     Head: Normocephalic.     Right Ear: Hearing, tympanic membrane, ear canal and external ear normal.     Left Ear: Hearing, tympanic membrane, ear canal and external ear normal.     Nose:     Right Turbinates: Enlarged and swollen.     Left Turbinates: Enlarged and swollen.     Right Sinus: No maxillary sinus tenderness or frontal sinus tenderness.     Left Sinus: No maxillary sinus tenderness or frontal sinus tenderness.     Mouth/Throat:     Pharynx: No posterior oropharyngeal erythema.     Tonsils: 0 on the right. 0 on the left.  Cardiovascular:     Rate and Rhythm: Normal rate and regular rhythm.  Pulmonary:     Effort: Pulmonary effort is normal.  Musculoskeletal:        General: Normal range of motion.  Skin:    Comments: Raised erythematic patch upper back as well as slight annular area left lower back with scaly texture  Neurological:     General: No focal deficit present.     Mental Status: She is alert and oriented to person, place, and time. Mental status is at baseline.  Psychiatric:        Mood and Affect: Mood normal.        Behavior: Behavior normal.        Thought Content: Thought content normal.        Judgment: Judgment normal.          Assessment & Plan:  Primary hypertension Assessment & Plan: Stable Continue losartan   Orders: -     amLODIPine Besylate; Take 1 tablet (10 mg total) by mouth daily.  Dispense: 90 tablet; Refill: 1  Contact dermatitis and eczema Assessment & Plan: Suspect contact  dermatitis vs psoriasis  Rx clobetasol cream   Orders: -     Clobetasol Propionate; Apply 1 Application topically 2 (two) times daily.  Dispense: 30 g; Refill: 11  Mixed hyperlipidemia Assessment & Plan: Continue lipitor 10 mg  Ordering lipid panel pending results  Orders: -     Lipid panel  Diabetes mellitus without  complication Assessment & Plan: Continue metformin 500 mg po bid May increase pending a1c  Orders: -     Hemoglobin A1c  Hypokalemia -     Comprehensive metabolic panel  On statin therapy -     Comprehensive metabolic panel  MDD (major depressive disorder), recurrent episode, moderate Assessment & Plan: Improving Continue sertraline 200 mg once daily and f/u with psychiatry and therapy as scheduled      Return in about 6 months (around 11/22/2022) for f/u blood pressure, f/u diabetes.  Mort Sawyers, MSN, APRN, FNP-C [facility]

## 2022-05-23 NOTE — Assessment & Plan Note (Signed)
Suspect contact dermatitis vs psoriasis  Rx clobetasol cream

## 2022-05-23 NOTE — Assessment & Plan Note (Signed)
Continue lipitor 10 mg  Ordering lipid panel pending results

## 2022-05-23 NOTE — Assessment & Plan Note (Signed)
Stable.  Continue losartan. 

## 2022-05-23 NOTE — Assessment & Plan Note (Signed)
Continue metformin 500 mg po bid May increase pending a1c

## 2022-05-23 NOTE — Assessment & Plan Note (Signed)
Improving Continue sertraline 200 mg once daily and f/u with psychiatry and therapy as scheduled

## 2022-05-31 DIAGNOSIS — G4733 Obstructive sleep apnea (adult) (pediatric): Secondary | ICD-10-CM | POA: Diagnosis not present

## 2022-06-06 DIAGNOSIS — E119 Type 2 diabetes mellitus without complications: Secondary | ICD-10-CM | POA: Diagnosis not present

## 2022-06-19 ENCOUNTER — Telehealth: Payer: BC Managed Care – PPO | Admitting: Psychiatry

## 2022-06-20 LAB — HM DIABETES EYE EXAM

## 2022-06-21 ENCOUNTER — Other Ambulatory Visit: Payer: Self-pay | Admitting: Psychiatry

## 2022-06-21 DIAGNOSIS — F3342 Major depressive disorder, recurrent, in full remission: Secondary | ICD-10-CM

## 2022-06-21 DIAGNOSIS — F411 Generalized anxiety disorder: Secondary | ICD-10-CM

## 2022-06-25 ENCOUNTER — Ambulatory Visit: Payer: BC Managed Care – PPO | Admitting: Clinical

## 2022-06-25 DIAGNOSIS — F3341 Major depressive disorder, recurrent, in partial remission: Secondary | ICD-10-CM

## 2022-06-25 NOTE — Progress Notes (Signed)
                Anise Harbin, LCSW 

## 2022-06-25 NOTE — Progress Notes (Addendum)
[facility] Counselor/Therapist Progress Note  Patient ID: Nicole Cervantes, MRN: [id number],    Date: 06/25/2022  Time Spent: 12:33pm - 1:24pm : 51 minutes  Treatment Type: Individual Therapy  Reported Symptoms: Patient reported lack of energy and motivation after she gets home from work.  Mental Status Exam: Appearance:  Well Groomed     Behavior: Appropriate  Motor: Normal  Speech/Language:  Clear and Coherent  Affect: Appropriate  Mood: normal  Thought process: normal  Thought content:   WNL  Sensory/Perceptual disturbances:   WNL  Orientation: oriented to person, place, and situation  Attention: Good  Concentration: Good  Memory: WNL  Fund of knowledge:  Good  Insight:   Good  Judgment:  Good  Impulse Control: Good   Risk Assessment: Danger to Self:  No Patient denied current suicidal ideation  Self-injurious Behavior: No Danger to Others: No Patient denied current homicidal ideation Duty to Warn:no Physical Aggression / Violence:No  Access to Firearms a concern: No  Gang Involvement:No   Subjective: Patient reported patient and family did not move and reported plans to purchase the home patient and family currently live in. Patient stated, "really good" in response patient's feelings regarding the purchase of their home. Patient stated, "life's good". Patient reported her husband is still out of work and patient is utilizing a bone stimulator to promote bone growth. Patient stated, "I've been fantastic", "like I'm really good". Patient reported she has not experienced any negative thoughts since last session. Patient reported improvement in communication between patient and husband and patient stated, "that makes it a lot easier". Patient reported she continues to utilize her gratitude journal 3-4 days per week. Patient stated, "the communication with him is amazing", and stated, "I'm not feeling depressed" in response to short term goal 1. Patient reported  she has been more thankful to her roommate for her efforts and stated, "I'm more grateful". Patient stated, "I think I have improved greatly on this" in response to short term goal 2. Patient stated, "I know the difference between the anxiety and the depression", "I don't feel like Im having depressive episodes at all" in response to short term goal 3. Patient reported she continues to experiences anxiety at times prior to social events.  Patient stated, "I'm much better at that", "I try to think before I have an outbursts" in response to short term goal 4. Patient stated, "that one Im still working on" in response to short term goal 5. Patient reported she notices at times she will think of herself negatively. Patient stated, "I'm doing great with that" in response to short term goal 6.   Interventions: Cognitive Behavioral Therapy. Clinician conducted session in person from clinician's office at [facility]. Reviewed events since last session. Discussed the status of recent stressors and the impact on patient's mood. Reviewed patient's goals and progress towards goals. Validated patient's progress.  Clinician requested patient complete self esteem journal for homework.     Collaboration of Care: Other is not required at this time.    Diagnosis: Major Depressive Disorder, recurrent, in partial remission   Plan: Patient is to utilize [employer] Therapy, thought re-framing, relaxation techniques, effective communication strategies, dialectical behavior therapy skills, and coping strategies to decrease symptoms associated with Major Depressive Disorder. Frequency: biweekly  Modality: individual      Long-term goal:   Patient stated, "to be happy again" and "be a positive person".    Reduce overall level, frequency, and intensity of the  feelings of depression as evidenced by decreased irritability, easily angered, increased appetite, difficulty getting out of bed due to fatigue, going  to bed and staying in bed after dinner, decrease in hygiene, sleeping late in the mornings, waking up in the middle of the night multiple times, lack of energy, change in concentration, difficulty recalling information at work, taking longer to complete tasks, "melancholy" mood, loss of interest, feelings of guilt, apologizing frequently, and symptoms of anxiety from 7 days per week to 0 to 1 days per week per patient report for at least 1 month.   Target Date: 01/17/23  Progress: progressing    Short-term goal:  Verbalize an understanding of the relationship between depressive symptoms and patient's communication with husband per patient's report.   Target Date: 06/17/22  Progress: Patient reported she feels this goal has been met.     Develop effective communication strategies for patient to utilize when expressing her thoughts and feelings to others in a controlled and assertive way  Target Date: 01/17/23  Progress: progressing    Verbally express an understanding of the relationship between feelings of depression, anxiety and the impact on thought patterns and behaviors.   Target Date: 01/17/23  Progress: progressing    Identify triggers for anger/irritability and develop coping strategies to utilize in response to feelings of anger/irritability to reduce anger outbursts per patient's report  Target Date: 06/17/22  Progress: patient reported she feels this goal has been met    Identify, challenge, and replace negative thought patterns and negative self talk that contribute to feelings of depression and anxiety with positive thoughts, beliefs, and positive self talk per patient's report   Target Date: 01/17/23  Progress: progressing    Develop and implement emotion regulation skills to decrease fluctuations in mood, such as, anger, irritability, anxiety, and depression  Target Date: 06/17/22  Progress: Patient reported she feels this goal has been met.        Doree Barthel,  LCSW

## 2022-06-30 DIAGNOSIS — G4733 Obstructive sleep apnea (adult) (pediatric): Secondary | ICD-10-CM | POA: Diagnosis not present

## 2022-07-01 ENCOUNTER — Telehealth: Payer: BC Managed Care – PPO | Admitting: Psychiatry

## 2022-07-01 DIAGNOSIS — G4733 Obstructive sleep apnea (adult) (pediatric): Secondary | ICD-10-CM | POA: Diagnosis not present

## 2022-07-03 ENCOUNTER — Telehealth (INDEPENDENT_AMBULATORY_CARE_PROVIDER_SITE_OTHER): Payer: BC Managed Care – PPO | Admitting: Psychiatry

## 2022-07-03 ENCOUNTER — Encounter: Payer: Self-pay | Admitting: Psychiatry

## 2022-07-03 DIAGNOSIS — Z634 Disappearance and death of family member: Secondary | ICD-10-CM

## 2022-07-03 DIAGNOSIS — G4701 Insomnia due to medical condition: Secondary | ICD-10-CM | POA: Diagnosis not present

## 2022-07-03 DIAGNOSIS — F4000 Agoraphobia, unspecified: Secondary | ICD-10-CM | POA: Diagnosis not present

## 2022-07-03 DIAGNOSIS — F4010 Social phobia, unspecified: Secondary | ICD-10-CM

## 2022-07-03 DIAGNOSIS — F3342 Major depressive disorder, recurrent, in full remission: Secondary | ICD-10-CM | POA: Diagnosis not present

## 2022-07-03 NOTE — Progress Notes (Signed)
Virtual Visit via Video Note  I connected with Nicole Cervantes on 07/03/22 at 10:30 AM EDT by a video enabled telemedicine application and verified that I am speaking with the correct person using two identifiers.  Location Provider Location : Remote office Patient Location : Work  Participants: Patient , Provider    I discussed the limitations of evaluation and management by telemedicine and the availability of in person appointments. The patient expressed understanding and agreed to proceed.   I discussed the assessment and treatment plan with the patient. The patient was provided an opportunity to ask questions and all were answered. The patient agreed with the plan and demonstrated an understanding of the instructions.   The patient was advised to call back or seek an in-person evaluation if the symptoms worsen or if the condition fails to improve as anticipated.   BH MD OP Progress Note  07/03/2022 11:30 AM Nicole Cervantes  MRN:  [id number]  Chief Complaint:  Chief Complaint  Patient presents with   Follow-up   Anxiety   Depression   Medication Refill   HPI: Nicole Cervantes is a 47 year old Caucasian female, married, employed, lives in [place], has a history of MDD, agoraphobia, anxiety disorder, bereavement, hypertension, diabetes mellitus was evaluated by telemedicine today.  Patient today reports she is currently doing well with regards to her mood.  Denies any sadness or anxiety symptoms.  Denies any significant grief at this time.  She reports she is compliant on the sertraline 100 mg.  Her dosage of Wellbutrin was readjusted by her primary care provider per report to 100 mg.  She reports she is currently tolerating it well.  That does help with her mood symptoms.  Patient reports sleep is overall good.  She is currently compliant on the CPAP.  She denies any significant fatigue during the day since using the CPAP.  She reports work is going well.  She had to stop using the  Ozempic due to side effects.  She currently is only on the metformin and tolerating it well.  Denies any suicidality, homicidality or perceptual disturbances.  Patient denies any other concerns today.  Visit Diagnosis:    ICD-10-CM   1. Recurrent major depressive disorder, in full remission (HCC)  F33.42     2. Agoraphobia  F40.00     3. Social anxiety disorder  F40.10     4. Insomnia due to medical condition  G47.01    Sleep apnea on CPAP    5. Bereavement  Z63.4       Past Psychiatric History: I have reviewed past psychiatric history from progress note on 11/28/2021.  Past trials of medications like Paxil, Lamictal, Wellbutrin, Seroquel  Past Medical History:  Past Medical History:  Diagnosis Date   Allergy    Anxiety    Back pain    Depression    Diabetes mellitus without complication (HCC)    Hypertension    DR. GRANDIS - [facility]   Migraines     Past Surgical History:  Procedure Laterality Date   ABDOMINAL HYSTERECTOMY  05/2007   IN [place], [place] - TOTAL EXCEPT OVARIES, LEIOMYOMA   COLONOSCOPY  2006   DX   Cyst removal to lower back     LAPAROSCOPY  2000   BURNED HOLES IN OVARIES TO RELEASE PRESSURE FROM CYSTS.  DR. BURNS, [place]   OVARIAN CYST REMOVAL     SKIN BIOPSY     TONSILLECTOMY      Family  Psychiatric History: I have reviewed family psychiatric history from progress note on 11/28/2021  Family History:  Family History  Problem Relation Age of Onset   Diabetes Mother        passed away 2021/05/22   Hypertension Mother    Depression Mother    Stroke Mother    Throat cancer Mother    Alcohol abuse Father    Prostate cancer Father 74   Diabetes Father    Hypertension Father    Bipolar disorder Sister    Bladder Cancer Sister 40       in remission   Lupus Sister    Bipolar disorder Sister    Depression Maternal Aunt    Depression Maternal Uncle    Alcohol abuse Maternal Grandfather    Depression Maternal Grandfather    Alcohol abuse  Maternal Grandmother    Heart disease Maternal Grandmother    Depression Maternal Grandmother    Bipolar disorder Other     Social History: I have reviewed social history from progress note on 11/28/2021 Social History   Socioeconomic History   Marital status: Married    Spouse name: Not on file   Number of children: 1   Years of education: 14   Highest education level: Associate degree: academic program  Occupational History    Employer: [employer]  Tobacco Use   Smoking status: Former    Years: 20    Types: Cigarettes    Quit date: 2009    Years since quitting: 15.4   Smokeless tobacco: Never  Vaping Use   Vaping Use: Never used  Substance and Sexual Activity   Alcohol use: Yes    Comment: occasionally once a month   Drug use: No   Sexual activity: Yes    Birth control/protection: Surgical    Comment: Hysterectomy   Other Topics Concern   Not on file  Social History Narrative   Not on file   Social Determinants of Health   Financial Resource Strain: Not on file  Food Insecurity: Not on file  Transportation Needs: Not on file  Physical Activity: Not on file  Stress: Not on file  Social Connections: Not on file    Allergies: No Known Allergies  Metabolic Disorder Labs: Lab Results  Component Value Date   HGBA1C 6.1 05/23/2022   MPG 117 10/25/2015   No results found for: "PROLACTIN" Lab Results  Component Value Date   CHOL 130 05/23/2022   TRIG 106.0 05/23/2022   HDL 45.50 05/23/2022   CHOLHDL 3 05/23/2022   VLDL 21.2 05/23/2022   LDLCALC 63 05/23/2022   LDLCALC 133 (H) 02/18/2022   Lab Results  Component Value Date   TSH 2.19 11/08/2021   TSH 4.845 (H) 10/25/2015    Therapeutic Level Labs: No results found for: "LITHIUM" No results found for: "VALPROATE" No results found for: "CBMZ"  Current Medications: Current Outpatient Medications  Medication Sig Dispense Refill   buPROPion (WELLBUTRIN) 100 MG tablet Take 100 mg by mouth 2  (two) times daily.     ACCU-CHEK GUIDE test strip USE TO CHECK BLOOD SUGAR UP TO 4 TIMES DAILY AS DIRECTED     Accu-Chek Softclix Lancets lancets SMARTSIG:Topical 1-4 Times Daily     ALPRAZolam (XANAX) 0.5 MG tablet Take 0.5 mg by mouth daily as needed.     amLODipine (NORVASC) 10 MG tablet Take 1 tablet (10 mg total) by mouth daily. 90 tablet 1   atorvastatin (LIPITOR) 10 MG tablet Take 1 tablet (10  mg total) by mouth daily. 90 tablet 3   blood glucose meter kit and supplies Dispense based on patient and insurance preference. Use up to four times daily as directed. (FOR ICD-10 E10.9, E11.9). 1 each 0   clobetasol cream (TEMOVATE) 0.05 % Apply 1 Application topically 2 (two) times daily. 30 g 11   clotrimazole-betamethasone (LOTRISONE) cream Apply externally BID prn sx up to 2 wks 15 g 1   famotidine (PEPCID) 20 MG tablet Take 1 tablet (20 mg total) by mouth daily as needed.     loratadine (CLARITIN) 10 MG tablet Take 1 tablet by mouth daily.     losartan (COZAAR) 50 MG tablet Take 1 tablet by mouth once daily 90 tablet 3   metFORMIN (GLUCOPHAGE) 500 MG tablet Take 1 tablet (500 mg total) by mouth 2 (two) times daily with a meal. 180 tablet 3   omeprazole (PRILOSEC) 40 MG capsule Take 1 capsule (40 mg total) by mouth daily for 14 days. 14 capsule 0   ondansetron (ZOFRAN-ODT) 4 MG disintegrating tablet Take 1 tablet (4 mg total) by mouth every 8 (eight) hours as needed for nausea or vomiting. 20 tablet 0   rOPINIRole (REQUIP) 0.25 MG tablet      sertraline (ZOLOFT) 100 MG tablet Take two tablets once daily 180 tablet 1   SUMAtriptan (IMITREX) 100 MG tablet Take by mouth.     triamcinolone cream (KENALOG) 0.1 % Apply 1 Application topically 2 (two) times daily as needed.     No current facility-administered medications for this visit.     Musculoskeletal: Strength & Muscle Tone:  UTA Gait & Station:  Seated Patient leans: N/A  Psychiatric Specialty Exam: Review of Systems   Psychiatric/Behavioral: Negative.      There were no vitals taken for this visit.There is no height or weight on file to calculate BMI.  General Appearance: Casual  Eye Contact:  Fair  Speech:  Clear and Coherent  Volume:  Normal  Mood:  Euthymic  Affect:  Appropriate  Thought Process:  Goal Directed and Descriptions of Associations: Intact  Orientation:  Full (Time, Place, and Person)  Thought Content: Logical   Suicidal Thoughts:  No  Homicidal Thoughts:  No  Memory:  Immediate;   Fair Recent;   Fair Remote;   Fair  Judgement:  Fair  Insight:  Fair  Psychomotor Activity:  Normal  Concentration:  Concentration: Fair and Attention Span: Fair  Recall:  [employer] of Knowledge: Fair  Language: Fair  Akathisia:  No  Handed:  Right  AIMS (if indicated): not done  Assets:  Communication Skills Desire for Improvement Housing Social Support  ADL's:  Intact  Cognition: WNL  Sleep:  Fair   Screenings: AUDIT    Flowsheet Row Admission (Discharged) from 10/24/2015 in [facility] MEDICINE  Alcohol Use Disorder Identification Test Final Score (AUDIT) 0      GAD-7    Flowsheet Row Office Visit from 05/15/2022 in [facility] Visit from 03/19/2022 in [facility] Office Visit from 11/28/2021 in [facility] Office Visit from 11/05/2021 in [facility]  Total GAD-7 Score 0 0 4 9      PHQ2-9    Flowsheet Row Video Visit from 07/03/2022 in [facility] Office Visit from 05/23/2022 in [facility] Visit from 05/15/2022 in [facility] at [employer] Visit from 03/19/2022 in [facility] Associates Video Visit from 01/30/2022 in [facility] Associates  PHQ-2  Total Score 0 0 0 1 1  PHQ-9 Total Score -- -- -- 4 6      Flowsheet Row Video Visit from 07/03/2022 in [facility] Office Visit from 03/19/2022 in [facility] Video Visit from 01/30/2022 in [facility]  C-SSRS RISK CATEGORY No Risk Low Risk Low Risk        Assessment and Plan: Nicole Cervantes is a 46 year old Caucasian female, employed, married, lives in [place], has a history of depression, anxiety, was evaluated by telemedicine today.  Patient is currently stable.  Plan as noted below.  Plan  MDD in remission Sertraline 100 mg p.o. daily Wellbutrin recently increased per report by primary care provider-100 mg p.o. daily Continue CBT with Ms. Doree Barthel.  Agoraphobia-improving Continue CBT Patient does have Xanax available prescribed for vertigo although it helps with anxiety also.  Bereavement-stable Continue grief counseling  Social anxiety disorder-improving Sertraline 100 mg p.o. daily Continue CBT  Insomnia-stable Currently on CPAP for OSA  Follow-up in clinic in 3 months or sooner .  Collaboration of Care: Collaboration of Care: Referral or follow-up with counselor/therapist AEB encouraged to continue CBT.  Patient/Guardian was advised Release of Information must be obtained prior to any record release in order to collaborate their care with an outside provider. Patient/Guardian was advised if they have not already done so to contact the registration department to sign all necessary forms in order for [place] to release information regarding their care.   Consent: Patient/Guardian gives verbal consent for treatment and assignment of benefits for services provided during this visit. Patient/Guardian expressed understanding and agreed to proceed.   This note was generated in part or whole with voice recognition software. Voice recognition is usually quite  accurate but there are transcription errors that can and very often do occur. I apologize for any typographical errors that were not detected and corrected.    Jomarie Longs, MD 07/03/2022, 11:30 AM

## 2022-07-09 ENCOUNTER — Ambulatory Visit: Payer: BC Managed Care – PPO | Admitting: Clinical

## 2022-07-28 ENCOUNTER — Ambulatory Visit: Payer: BC Managed Care – PPO | Admitting: Dermatology

## 2022-07-28 VITALS — BP 142/82 | HR 73

## 2022-07-28 DIAGNOSIS — D2361 Other benign neoplasm of skin of right upper limb, including shoulder: Secondary | ICD-10-CM

## 2022-07-28 DIAGNOSIS — D225 Melanocytic nevi of trunk: Secondary | ICD-10-CM | POA: Diagnosis not present

## 2022-07-28 DIAGNOSIS — L820 Inflamed seborrheic keratosis: Secondary | ICD-10-CM | POA: Diagnosis not present

## 2022-07-28 DIAGNOSIS — D2221 Melanocytic nevi of right ear and external auricular canal: Secondary | ICD-10-CM

## 2022-07-28 DIAGNOSIS — L280 Lichen simplex chronicus: Secondary | ICD-10-CM

## 2022-07-28 DIAGNOSIS — D2271 Melanocytic nevi of right lower limb, including hip: Secondary | ICD-10-CM

## 2022-07-28 DIAGNOSIS — D239 Other benign neoplasm of skin, unspecified: Secondary | ICD-10-CM

## 2022-07-28 DIAGNOSIS — Z1283 Encounter for screening for malignant neoplasm of skin: Secondary | ICD-10-CM

## 2022-07-28 DIAGNOSIS — Z808 Family history of malignant neoplasm of other organs or systems: Secondary | ICD-10-CM

## 2022-07-28 DIAGNOSIS — L821 Other seborrheic keratosis: Secondary | ICD-10-CM

## 2022-07-28 DIAGNOSIS — W908XXA Exposure to other nonionizing radiation, initial encounter: Secondary | ICD-10-CM

## 2022-07-28 DIAGNOSIS — L578 Other skin changes due to chronic exposure to nonionizing radiation: Secondary | ICD-10-CM

## 2022-07-28 DIAGNOSIS — L814 Other melanin hyperpigmentation: Secondary | ICD-10-CM

## 2022-07-28 DIAGNOSIS — L729 Follicular cyst of the skin and subcutaneous tissue, unspecified: Secondary | ICD-10-CM

## 2022-07-28 DIAGNOSIS — D229 Melanocytic nevi, unspecified: Secondary | ICD-10-CM

## 2022-07-28 DIAGNOSIS — D2372 Other benign neoplasm of skin of left lower limb, including hip: Secondary | ICD-10-CM

## 2022-07-28 DIAGNOSIS — D2371 Other benign neoplasm of skin of right lower limb, including hip: Secondary | ICD-10-CM

## 2022-07-28 DIAGNOSIS — L720 Epidermal cyst: Secondary | ICD-10-CM

## 2022-07-28 NOTE — Patient Instructions (Addendum)
Start Clobetasol Cream - Apply to itchy rash on the right ankle twice a day for up to 4 weeks. Avoid scratching/picking/rubbing. Avoid applying to face, groin, and axilla. Use as directed. Long-term use can cause thinning of the skin.   Cryotherapy Aftercare  Wash gently with soap and water everyday.   Apply Vaseline and Band-Aid daily until healed.   Seborrheic Keratosis  What causes seborrheic keratoses? Seborrheic keratoses are harmless, common skin growths that first appear during adult life.  As time goes by, more growths appear.  Some people may develop a large number of them.  Seborrheic keratoses appear on both covered and uncovered body parts.  They are not caused by sunlight.  The tendency to develop seborrheic keratoses can be inherited.  They vary in color from skin-colored to gray, brown, or even black.  They can be either smooth or have a rough, warty surface.   Seborrheic keratoses are superficial and look as if they were stuck on the skin.  Under the microscope this type of keratosis looks like layers upon layers of skin.  That is why at times the top layer may seem to fall off, but the rest of the growth remains and re-grows.    Treatment Seborrheic keratoses do not need to be treated, but can easily be removed in the office.  Seborrheic keratoses often cause symptoms when they rub on clothing or jewelry.  Lesions can be in the way of shaving.  If they become inflamed, they can cause itching, soreness, or burning.  Removal of a seborrheic keratosis can be accomplished by freezing, burning, or surgery. If any spot bleeds, scabs, or grows rapidly, please return to have it checked, as these can be an indication of a skin cancer.   Melanoma ABCDEs  Melanoma is the most dangerous type of skin cancer, and is the leading cause of death from skin disease.  You are more likely to develop melanoma if you: Have light-colored skin, light-colored eyes, or red or blond hair Spend a lot of  time in the sun Tan regularly, either outdoors or in a tanning bed Have had blistering sunburns, especially during childhood Have a close family member who has had a melanoma Have atypical moles or large birthmarks  Early detection of melanoma is key since treatment is typically straightforward and cure rates are extremely high if we catch it early.   The first sign of melanoma is often a change in a mole or a new dark spot.  The ABCDE system is a way of remembering the signs of melanoma.  A for asymmetry:  The two halves do not match. B for border:  The edges of the growth are irregular. C for color:  A mixture of colors are present instead of an even brown color. D for diameter:  Melanomas are usually (but not always) greater than 6mm - the size of a pencil eraser. E for evolution:  The spot keeps changing in size, shape, and color.  Please check your skin once per month between visits. You can use a small mirror in front and a large mirror behind you to keep an eye on the back side or your body.   If you see any new or changing lesions before your next follow-up, please call to schedule a visit.  Please continue daily skin protection including broad spectrum sunscreen SPF 30+ to sun-exposed areas, reapplying every 2 hours as needed when you're outdoors.   Staying in the shade or wearing long sleeves,  sun glasses (UVA+UVB protection) and wide brim hats (4-inch brim around the entire circumference of the hat) are also recommended for sun protection.    Due to recent changes in healthcare laws, you may see results of your pathology and/or laboratory studies on MyChart before the doctors have had a chance to review them. We understand that in some cases there may be results that are confusing or concerning to you. Please understand that not all results are received at the same time and often the doctors may need to interpret multiple results in order to provide you with the best plan of care  or course of treatment. Therefore, we ask that you please give [place] 2 business days to thoroughly review all your results before contacting the office for clarification. Should we see a critical lab result, you will be contacted sooner.   If You Need Anything After Your Visit  If you have any questions or concerns for your doctor, please call our main line at [phone] and press option 4 to reach your doctor's medical assistant. If no one answers, please leave a voicemail as directed and we will return your call as soon as possible. Messages left after 4 pm will be answered the following business day.   You may also send [place] a message via MyChart. We typically respond to MyChart messages within 1-2 business days.  For prescription refills, please ask your pharmacy to contact our office. Our fax number is [phone].  If you have an urgent issue when the clinic is closed that cannot wait until the next business day, you can page your doctor at the number below.    Please note that while we do our best to be available for urgent issues outside of office hours, we are not available 24/7.   If you have an urgent issue and are unable to reach [place], you may choose to seek medical care at your doctor's office, retail clinic, urgent care center, or emergency room.  If you have a medical emergency, please immediately call 911 or go to the emergency department.  Pager Numbers  - Dr. Gwen Pounds: [phone]  - Dr. Neale Burly: [phone]  - Dr. Roseanne Reno: [phone]  In the event of inclement weather, please call our main line at [phone] for an update on the status of any delays or closures.  Dermatology Medication Tips: Please keep the boxes that topical medications come in in order to help keep track of the instructions about where and how to use these. Pharmacies typically print the medication instructions only on the boxes and not directly on the medication tubes.   If your medication is too  expensive, please contact our office at [phone] option 4 or send [place] a message through MyChart.   We are unable to tell what your co-pay for medications will be in advance as this is different depending on your insurance coverage. However, we may be able to find a substitute medication at lower cost or fill out paperwork to get insurance to cover a needed medication.   If a prior authorization is required to get your medication covered by your insurance company, please allow [place] 1-2 business days to complete this process.  Drug prices often vary depending on where the prescription is filled and some pharmacies may offer cheaper prices.  The website www.goodrx.com contains coupons for medications through different pharmacies. The prices here do not account for what the cost may be with help from insurance (it may be cheaper with your insurance),  but the website can give you the price if you did not use any insurance.  - You can print the associated coupon and take it with your prescription to the pharmacy.  - You may also stop by our office during regular business hours and pick up a GoodRx coupon card.  - If you need your prescription sent electronically to a different pharmacy, notify our office through [facility] or by phone at [phone] option 4.     Si Usted Necesita Algo Despus de Su Visita  Tambin puede enviarnos un mensaje a travs de Clinical cytogeneticist. Por lo general respondemos a los mensajes de MyChart en el transcurso de 1 a 2 das hbiles.  Para renovar recetas, por favor pida a su farmacia que se ponga en contacto con nuestra oficina. Annie Sable de fax es [place] [phone].  Si tiene un asunto urgente cuando la clnica est cerrada y que no puede esperar hasta el siguiente da hbil, puede llamar/localizar a su doctor(a) al nmero que aparece a continuacin.   Por favor, tenga en cuenta que aunque hacemos todo lo posible para estar disponibles para asuntos urgentes fuera  del horario de [place], no estamos disponibles las 24 horas del da, los 7 [address]  [address] de la [place].   Si tiene un problema urgente y no puede comunicarse con nosotros, puede optar por buscar atencin mdica  en el consultorio de su doctor(a), en una clnica privada, en un centro de atencin urgente o en una sala de emergencias.  Si tiene Engineer, drilling, por favor llame inmediatamente al 911 o vaya a la sala de emergencias.  Nmeros de bper  - Dr. Gwen Pounds: [phone]  - Dra. Moye: [phone]  - Dra. Roseanne Reno: [phone]  En caso de inclemencias del [place], por favor llame a Lacy Duverney principal al [phone] para una actualizacin sobre el [place] de cualquier retraso o cierre.  Consejos para la medicacin en dermatologa: Por favor, guarde las cajas en las que vienen los medicamentos de uso tpico para ayudarle a seguir las instrucciones sobre dnde y cmo usarlos. Las farmacias generalmente imprimen las instrucciones del medicamento slo en las cajas y no directamente en los tubos del [place].   Si su medicamento es muy caro, por favor, pngase en contacto con Rolm Gala llamando al [phone] y presione la opcin 4 o envenos un mensaje a travs de Clinical cytogeneticist.   No podemos decirle cul ser su copago por los medicamentos por adelantado ya que esto es diferente dependiendo de la cobertura de su seguro. Sin embargo, es posible que podamos encontrar un medicamento sustituto a Audiological scientist un formulario para que el seguro cubra el medicamento que se considera necesario.   Si se requiere una autorizacin previa para que su compaa de seguros [place] su medicamento, por favor permtanos de 1 a 2 das hbiles para completar [address]  Los precios de los medicamentos varan con frecuencia dependiendo del Environmental consultant de dnde se surte la receta y alguna farmacias pueden ofrecer precios ms baratos.  El sitio web www.goodrx.com tiene cupones para medicamentos de Engineer, civil (consulting). Los precios aqu no tienen en cuenta lo que podra costar con la ayuda del seguro (puede ser ms barato con su seguro), pero el sitio web puede darle el precio si no utiliz Tourist information centre manager.  - Puede imprimir el cupn correspondiente y llevarlo con su receta a la farmacia.  - Tambin puede pasar por nuestra oficina durante el horario de atencin regular y Education officer, museum una tarjeta de cupones  de GoodRx.  - Si necesita que su receta se enve electrnicamente a una farmacia diferente, informe a nuestra oficina a travs de MyChart de [place] o por telfono llamando al [phone] y presione la opcin 4.

## 2022-07-28 NOTE — Progress Notes (Signed)
New Patient Visit   Subjective  Nicole Cervantes is a 47 y.o. female who presents for the following: Skin Cancer Screening and Full Body Skin Exam  The patient presents for Total-Body Skin Exam (TBSE) for skin cancer screening and mole check. The patient has spots, moles and lesions to be evaluated, some may be new or changing. Spot on right chest gets irritated.  Patient has a history of rash on the right anterior ankle x many years, itchy.  No history of skin cancer or abnormal moles.    The following portions of the chart were reviewed this encounter and updated as appropriate: medications, allergies, medical history  Review of Systems:  No other skin or systemic complaints except as noted in HPI or Assessment and Plan.  Objective  Well appearing patient in no apparent distress; mood and affect are within normal limits.  A full examination was performed including scalp, head, eyes, ears, nose, lips, neck, chest, axillae, abdomen, back, buttocks, bilateral upper extremities, bilateral lower extremities, hands, feet, fingers, toes, fingernails, and toenails. All findings within normal limits unless otherwise noted below.   Relevant physical exam findings are noted in the Assessment and Plan.  R clavicle Erythematous stuck-on, waxy papule or plaque         Assessment & Plan   LENTIGINES, SEBORRHEIC KERATOSES, HEMANGIOMAS - Benign normal skin lesions - Benign-appearing - Call for any changes  MELANOCYTIC NEVI - Tan-brown and/or pink-flesh-colored symmetric macules and papules  - 7.0 x 3.0 mm medium dark brown speckled macule with adjacent 4 x 2 mm  medium brown macule of the left lower back  - 9.0 x 4.0 mm regular brown macule of the left abdomen  -  4 mm medium dark brown macule with notch of the R medial upper calf  - 6.0 mm brown papule with adjacent 3.0 mm brown papule of the R antihelix  - Benign appearing on exam today - Observation - Call clinic for new or  changing moles - Recommend daily use of broad spectrum spf 30+ sunscreen to sun-exposed areas.   ACTINIC DAMAGE - Chronic condition, secondary to cumulative UV/sun exposure - diffuse scaly erythematous macules with underlying dyspigmentation - Recommend daily broad spectrum sunscreen SPF 30+ to sun-exposed areas, reapply every 2 hours as needed.  - Staying in the shade or wearing long sleeves, sun glasses (UVA+UVB protection) and wide brim hats (4-inch brim around the entire circumference of the hat) are also recommended for sun protection.  - Call for new or changing lesions.  DERMATOFIBROMAS Exam: Firm pink/brown papulenodules with dimple sign, left medial ankle and right anterior ankle  Treatment Plan: A dermatofibroma is a benign growth possibly related to trauma, such as an insect bite, cut from shaving, or inflamed acne-type bump.  Treatment options to remove include shave or excision with resulting scar and risk of recurrence.  Since benign-appearing and not bothersome, will observe for now.   EPIDERMAL INCLUSION CYST Exam: 5.0 mm firm flesh papule at left lateral eye canthus  Benign-appearing. Exam most consistent with an epidermal inclusion cyst. Discussed that a cyst is a benign growth that can grow over time and sometimes get irritated or inflamed. Recommend observation if it is not bothersome. Discussed option of surgical excision to remove it if it is growing, symptomatic, or other changes noted. Please call for new or changing lesions so they can be evaluated.    SKIN CANCER SCREENING PERFORMED TODAY.  FAMILY HISTORY OF SKIN CANCER What type(s): BCC or  SCC Who affected: father  LENTIGO/ melanotic macule Exam: 4 x 2 mm gray/brown macule of the left lateral lower lip Due to sun exposure  Treatment Plan: Benign-appearing, observe. Recommend daily broad spectrum sunscreen SPF 30+ to sun-exposed areas, reapply every 2 hours as needed.  Call for any changes  BLUE  NEVUS Exam: 2.5 mm gray/blue papule of the right forearm below elbow  Treatment Plan: Benign appearing on exam today. Recommend observation. Call clinic for new or changing moles. Recommend daily use of broad spectrum spf 30+ sunscreen to sun-exposed areas.   LICHEN SIMPLEX CHRONICUS  Exam: Hyperkeratotic pink scaly patch of the right anterior ankle.  Chronic and persistent condition with duration or expected duration over one year. Condition is bothersome/symptomatic for patient. Currently flared.   Treatment Plan: Start Clobetasol Cream - Apply to rash BID until improved. Pt has at home. Avoid applying to face, groin, and axilla. Use as directed. Long-term use can cause thinning of the skin.  Topical steroids (such as triamcinolone, fluocinolone, fluocinonide, mometasone, clobetasol, halobetasol, betamethasone, hydrocortisone) can cause thinning and lightening of the skin if they are used for too long in the same area. Your physician has selected the right strength medicine for your problem and area affected on the body. Please use your medication only as directed by your physician to prevent side effects.   Avoid scratching/rubbing   Inflamed seborrheic keratosis R clavicle  Symptomatic, irritating, patient would like treated.  Destruction of lesion - R clavicle  Destruction method: cryotherapy   Informed consent: discussed and consent obtained   Lesion destroyed using liquid nitrogen: Yes   Region frozen until ice ball extended beyond lesion: Yes   Outcome: patient tolerated procedure well with no complications   Post-procedure details: wound care instructions given   Additional details:  Prior to procedure, discussed risks of blister formation, small wound, skin dyspigmentation, or rare scar following cryotherapy. Recommend Vaseline ointment to treated areas while healing.     Return in about 1 year (around 07/28/2023).  ICherlyn Labella, CMA, am acting as scribe for Willeen Niece, MD .   Documentation: I have reviewed the above documentation for accuracy and completeness, and I agree with the above.  Willeen Niece, MD

## 2022-07-30 ENCOUNTER — Ambulatory Visit: Payer: BC Managed Care – PPO | Admitting: Clinical

## 2022-07-30 DIAGNOSIS — F3341 Major depressive disorder, recurrent, in partial remission: Secondary | ICD-10-CM

## 2022-07-30 NOTE — Progress Notes (Signed)
                Mehtab Dolberry, LCSW 

## 2022-07-30 NOTE — Progress Notes (Signed)
[facility] Counselor/Therapist Progress Note  Patient ID: Nicole Cervantes, MRN: [id number],    Date: 07/30/2022  Time Spent: 12:33pm - 1:38pm : 65 minutes   Treatment Type: Individual Therapy  Reported Symptoms: Patient reported depressed mood  Mental Status Exam: Appearance:  Neat and Well Groomed     Behavior: Appropriate  Motor: Normal  Speech/Language:  Clear and Coherent  Affect: Appropriate  Mood: depressed  Thought process: normal  Thought content:   WNL  Sensory/Perceptual disturbances:   WNL  Orientation: oriented to person, place, and situation  Attention: Good  Concentration: Good  Memory: WNL  Fund of knowledge:  Good  Insight:   Good  Judgment:  Good  Impulse Control: Fair   Risk Assessment: Danger to Self:  No Patient denied current suicidal ideation  Self-injurious Behavior: No Danger to Others: No Patient denied current homicidal ideation Duty to Warn:no Physical Aggression / Violence:No  Access to Firearms a concern: No  Gang Involvement:No   Subjective: Patient reported work related stress due to changes in management and potential layoffs. Patient reported she cancelled her last therapy appointment due to concerns related to recent work related changes. Patient reported her supervisor was laid off and there is anticipation of additional layoffs. Patient stated, "right now I think I'm ok" in response to work related changes. Patient reported after talking to human resources she feels more comfortable with the changes. 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 Patient reported she is considering a part time job due to financial stress. Patient reported a change in her family's lifestyle/activities due to financial stress. Patient stated, "I'm up and down" in response to patient's mood. Patient stated, "in the past two  weeks I've been a little more down" in response to stressors and reported she has not been able to participate in activities patient enjoys due to financial concerns. Patient stated, "I'm waking up in bad mood". Patient reported she enjoys, reading, painting, playing games on her phone, going to the movies, and going to the pool. Patient stated, "I have a phone addiction so bad that brings on depression". Patient reported she deleted 3 games from her phone after recognizing the impact on her mood. Patient reported she and her husband are not able to go for walks as they did before and reported her husband's activities are limited due to his medical conditions.   Interventions: Cognitive Behavioral Therapy and supportive therapy.   Clinician conducted session in person from clinician's office at [facility]. Reviewed events since last session. Provided supportive therapy, active and reflective listening as patient discussed recent changes at patient's place of employment and patient's concerns related to recent changes. Discussed the status of additional stressors and the impact on patient's mood. Explored additional triggers for decline in mood. Explored activities that benefit patient's mood. Clinician requested for homework patient maintain self esteem journal and identify at least 5 local activities of interest to patient.     Collaboration of Care: Other is not required at this time.    Diagnosis: Major Depressive Disorder, recurrent, in partial remission   Plan: Patient is to utilize [employer] Therapy, thought re-framing, relaxation techniques, effective communication strategies, dialectical behavior therapy skills, and coping strategies to decrease symptoms associated with Major Depressive Disorder. Frequency: biweekly  Modality: individual      Long-term goal:   Patient stated, "to be happy again" and "  be a positive person".    Reduce overall level, frequency, and intensity  of the feelings of depression as evidenced by decreased irritability, easily angered, increased appetite, difficulty getting out of bed due to fatigue, going to bed and staying in bed after dinner, decrease in hygiene, sleeping late in the mornings, waking up in the middle of the night multiple times, lack of energy, change in concentration, difficulty recalling information at work, taking longer to complete tasks, "melancholy" mood, loss of interest, feelings of guilt, apologizing frequently, and symptoms of anxiety from 7 days per week to 0 to 1 days per week per patient report for at least 1 month.   Target Date: 01/17/23  Progress: progressing    Short-term goal:  Verbalize an understanding of the relationship between depressive symptoms and patient's communication with husband per patient's report.   Target Date: 06/17/22  Progress: Patient reported she feels this goal has been met.     Develop effective communication strategies for patient to utilize when expressing her thoughts and feelings to others in a controlled and assertive way  Target Date: 01/17/23  Progress: progressing    Verbally express an understanding of the relationship between feelings of depression, anxiety and the impact on thought patterns and behaviors.   Target Date: 01/17/23  Progress: progressing    Identify triggers for anger/irritability and develop coping strategies to utilize in response to feelings of anger/irritability to reduce anger outbursts per patient's report  Target Date: 06/17/22  Progress: patient reported she feels this goal has been met    Identify, challenge, and replace negative thought patterns and negative self talk that contribute to feelings of depression and anxiety with positive thoughts, beliefs, and positive self talk per patient's report   Target Date: 01/17/23  Progress: progressing    Develop and implement emotion regulation skills to decrease fluctuations in mood, such as, anger,  irritability, anxiety, and depression  Target Date: 06/17/22  Progress: Patient reported she feels this goal has been met.      Doree Barthel, LCSW

## 2022-08-01 DIAGNOSIS — G4733 Obstructive sleep apnea (adult) (pediatric): Secondary | ICD-10-CM | POA: Diagnosis not present

## 2022-08-31 DIAGNOSIS — G4733 Obstructive sleep apnea (adult) (pediatric): Secondary | ICD-10-CM | POA: Diagnosis not present

## 2022-09-02 ENCOUNTER — Ambulatory Visit: Payer: BC Managed Care – PPO | Admitting: Clinical

## 2022-09-02 DIAGNOSIS — F3341 Major depressive disorder, recurrent, in partial remission: Secondary | ICD-10-CM | POA: Diagnosis not present

## 2022-09-02 NOTE — Progress Notes (Signed)
[facility] Counselor/Therapist Progress Note  Patient ID: Nicole Cervantes, MRN: [id number],    Date: 09/02/2022  Time Spent: 12:33pm - 1:36pm : 63 minutes   Treatment Type: Individual Therapy  Reported Symptoms: Patient reported a melancholy mood  Mental Status Exam: Appearance:  Neat and Well Groomed     Behavior: Appropriate  Motor: Normal  Speech/Language:  Clear and Coherent  Affect: Appropriate  Mood: Patient reported melancholy mood  Thought process: normal  Thought content:   WNL  Sensory/Perceptual disturbances:   WNL  Orientation: oriented to person, place, and situation  Attention: Good  Concentration: Good  Memory: WNL  Fund of knowledge:  Good  Insight:   Good  Judgment:  Good  Impulse Control: Good   Risk Assessment: Danger to Self:  No Patient denied current suicidal ideation  Self-injurious Behavior: No Danger to Others: No Patient denied current homicidal ideation Duty to Warn:no Physical Aggression / Violence:No  Access to Firearms a concern: No  Gang Involvement:No   Subjective: Patient stated, "things are going really well". Patient reported next month she will be able to start the process of purchasing the home they currently live in. Patient reported her husband is still out of work and has filed for disability.  Patient reported she is working extra hours at her current place of employment to address financial stressors. Patient stated, "I'm not letting it get me down surprisingly" in response to stressors. Patient reported since last session she identified local activities to participate in that are cost effective. Patient reported she is joining a [employer] and stated, "Im super excited to do that". Patient reported she hopes participating in the book club will decrease the time patient spends on her phone. Patient reported her motivation for spending a significant amount of time on her phone is the gift cards she receives by playing games  on her phone. Patient reported playing games on her phone allows patient to not think about stressors.  Patient stated, "it removes me from reality for a little while". Patient reported feeling she is addicted to playing games on her phone. Patient reported she does not spend as much time on her phone when she is on vacation and reported she does not miss playing games when on vacation. Patient reported new activities prevent patient from spending time on her phone during vacations. Patient reported her husband experiences pain when walking and patient reported she misses going for walks with husband. Patient reported feeling a sense of disconnect from husband when they are not able to participate in activities together. Patient reported she noticed when completing her self esteem journal patient experienced difficulty writing down what she did that was positive.   Interventions: Cognitive Behavioral Therapy and Interpersonal. Clinician conducted session in person from clinician's office at [facility]. Reviewed events since last session. Discussed the status of recent stressors and the impact on patient's mood. Discussed strategies patient has implemented to address stressors. Reviewed patient's homework to identify local activities of interest. Discussed patient's concern as it relates to the amount of time patient is spending on her phone. Explored patient's motivation for spending time on her phone. Explored differences in patient's behaviors as it relates to the use of her phone when patient is at home versus when patient is on vacation. Assisted patient in exploring patient's feelings regarding her husband's health and the impact on their relationship. Explored other activities patient/husband could participate in together, such as, going for a picnic, fishing at  a local lake/river, trivia night. Reviewed patient's homework to continue self esteem journal. Clinician requested for homework patient  continue self esteem journal.    Collaboration of Care: Other is not required at this time.    Diagnosis: Major Depressive Disorder, recurrent, in partial remission   Plan: Patient is to utilize [employer], thought re-framing, relaxation techniques, effective communication strategies, dialectical behavior therapy skills, and coping strategies to decrease symptoms associated with Major Depressive Disorder. Frequency: biweekly  Modality: individual      Long-term goal:   Patient stated, "to be happy again" and "be a positive person".    Reduce overall level, frequency, and intensity of the feelings of depression as evidenced by decreased irritability, easily angered, increased appetite, difficulty getting out of bed due to fatigue, going to bed and staying in bed after dinner, decrease in hygiene, sleeping late in the mornings, waking up in the middle of the night multiple times, lack of energy, change in concentration, difficulty recalling information at work, taking longer to complete tasks, "melancholy" mood, loss of interest, feelings of guilt, apologizing frequently, and symptoms of anxiety from 7 days per week to 0 to 1 days per week per patient report for at least 1 month.   Target Date: 01/17/23  Progress: progressing    Short-term goal:  Verbalize an understanding of the relationship between depressive symptoms and patient's communication with husband per patient's report.   Target Date: 06/17/22  Progress: Patient reported she feels this goal has been met.     Develop effective communication strategies for patient to utilize when expressing her thoughts and feelings to others in a controlled and assertive way  Target Date: 01/17/23  Progress: progressing    Verbally express an understanding of the relationship between feelings of depression, anxiety and the impact on thought patterns and behaviors.   Target Date: 01/17/23  Progress: progressing    Identify  triggers for anger/irritability and develop coping strategies to utilize in response to feelings of anger/irritability to reduce anger outbursts per patient's report  Target Date: 06/17/22  Progress: patient reported she feels this goal has been met    Identify, challenge, and replace negative thought patterns and negative self talk that contribute to feelings of depression and anxiety with positive thoughts, beliefs, and positive self talk per patient's report   Target Date: 01/17/23  Progress: progressing    Develop and implement emotion regulation skills to decrease fluctuations in mood, such as, anger, irritability, anxiety, and depression  Target Date: 06/17/22  Progress: Patient reported she feels this goal has been met.      Doree Barthel, LCSW

## 2022-09-02 NOTE — Progress Notes (Signed)
                Karen Sharpe, LCSW 

## 2022-09-08 ENCOUNTER — Encounter: Payer: Self-pay | Admitting: Family

## 2022-09-08 DIAGNOSIS — E119 Type 2 diabetes mellitus without complications: Secondary | ICD-10-CM

## 2022-09-09 MED ORDER — TIRZEPATIDE 2.5 MG/0.5ML ~~LOC~~ SOAJ: 2.5000 mg | SUBCUTANEOUS | 0 refills | Status: DC

## 2022-09-20 ENCOUNTER — Other Ambulatory Visit: Payer: Self-pay | Admitting: Psychiatry

## 2022-09-20 DIAGNOSIS — F3342 Major depressive disorder, recurrent, in full remission: Secondary | ICD-10-CM

## 2022-09-24 ENCOUNTER — Ambulatory Visit: Payer: BC Managed Care – PPO | Admitting: Clinical

## 2022-09-24 DIAGNOSIS — F3341 Major depressive disorder, recurrent, in partial remission: Secondary | ICD-10-CM

## 2022-09-24 NOTE — Progress Notes (Signed)
                Karen Sharpe, LCSW 

## 2022-09-24 NOTE — Progress Notes (Signed)
[facility] Counselor/Therapist Progress Note  Patient ID: Nicole Cervantes, MRN: [id number],    Date: 09/24/2022  Time Spent: 12:32pm - 1:24pm : 52 minutes   Treatment Type: Individual Therapy  Reported Symptoms: none reported  Mental Status Exam: Appearance:  Neat and Well Groomed     Behavior: Appropriate  Motor: Normal  Speech/Language:  Clear and Coherent  Affect: Appropriate  Mood: normal  Thought process: normal  Thought content:   WNL  Sensory/Perceptual disturbances:   WNL  Orientation: oriented to person, place, and situation  Attention: Good  Concentration: Good  Memory: WNL  Fund of knowledge:  Good  Insight:   Good  Judgment:  Good  Impulse Control: Good   Risk Assessment: Danger to Self:  No Patient denied current suicidal ideation  Self-injurious Behavior: No Danger to Others: No Patient denied current homicidal ideation Duty to Warn:no Physical Aggression / Violence:No  Access to Firearms a concern: No  Gang Involvement:No   Subjective: Patient reported the process of purchasing their home is progressing and they are expected to close on their home in September. Patient stated, "good and scared" in response to patient's feelings regarding the purchase of their home. Patient stated, "a lot of positives" in regards to recent events. Patient stated, "really good" in response to patient's mood since last session. Patient reported a two week period of decreased energy since last session and stated, "it just made me feel bad". Patient reported she reached out to her PCP to discuss the decrease in energy and treatment options. Patient reported she recently asked for a raise at work and stated, "it went so well" in regards to the outcome of the conversation. Patient stated, "things are just really good". Patient reported she attended a book club in person and stated, "I went and it was great". Patient stated, "that was huge for me" and reported she typically  does not participate in activities independently. Patient stated, "there was a little anxiety before and I pushed away" in regards to attending the book club.  During session, patient shared her self esteem journal entries. Patient reported she recognized a colleague at a company meeting and stated, "I felt so good" in response. Patient reported she has not been spending as much time on her phone playing games and reported she has been reading her book. Patient stated, "now that I look at it, its great", "it really feels good" in response to completion of her self esteem journal. Patient stated, "I feel great".   Interventions: Cognitive Behavioral Therapy. Clinician conducted session in person from clinician's office at [facility]. Discussed the status of recent stressors and patient's thoughts and feelings in response. Assessed patient's mood since last session and patient's current mood. Discussed recent decrease in energy and triggers for recent decrease. Discussed recent situations in which patient advocated for herself and the outcome. Reviewed patient's self esteem journal and explored patient's response to self esteem journal. Provided reflective listening and validation. Clinician requested for homework patient continue self esteem journal.  Collaboration of Care: Other is not required at this time.    Diagnosis: Major Depressive Disorder, recurrent, in partial remission   Plan: Patient is to utilize [employer], thought re-framing, relaxation techniques, effective communication strategies, dialectical behavior therapy skills, and coping strategies to decrease symptoms associated with Major Depressive Disorder. Frequency: biweekly  Modality: individual      Long-term goal:   Patient stated, "to be happy again" and "be a positive person".  Reduce overall level, frequency, and intensity of the feelings of depression as evidenced by decreased irritability, easily  angered, increased appetite, difficulty getting out of bed due to fatigue, going to bed and staying in bed after dinner, decrease in hygiene, sleeping late in the mornings, waking up in the middle of the night multiple times, lack of energy, change in concentration, difficulty recalling information at work, taking longer to complete tasks, "melancholy" mood, loss of interest, feelings of guilt, apologizing frequently, and symptoms of anxiety from 7 days per week to 0 to 1 days per week per patient report for at least 1 month.   Target Date: 01/17/23  Progress: progressing    Short-term goal:  Verbalize an understanding of the relationship between depressive symptoms and patient's communication with husband per patient's report.   Target Date: 06/17/22  Progress: Patient reported she feels this goal has been met.     Develop effective communication strategies for patient to utilize when expressing her thoughts and feelings to others in a controlled and assertive way  Target Date: 01/17/23  Progress: progressing    Verbally express an understanding of the relationship between feelings of depression, anxiety and the impact on thought patterns and behaviors.   Target Date: 01/17/23  Progress: progressing    Identify triggers for anger/irritability and develop coping strategies to utilize in response to feelings of anger/irritability to reduce anger outbursts per patient's report  Target Date: 06/17/22  Progress: patient reported she feels this goal has been met    Identify, challenge, and replace negative thought patterns and negative self talk that contribute to feelings of depression and anxiety with positive thoughts, beliefs, and positive self talk per patient's report   Target Date: 01/17/23  Progress: progressing    Develop and implement emotion regulation skills to decrease fluctuations in mood, such as, anger, irritability, anxiety, and depression  Target Date: 06/17/22  Progress:  Patient reported she feels this goal has been met.     Doree Barthel, LCSW

## 2022-10-02 ENCOUNTER — Other Ambulatory Visit: Payer: Self-pay | Admitting: Family

## 2022-10-07 ENCOUNTER — Telehealth (INDEPENDENT_AMBULATORY_CARE_PROVIDER_SITE_OTHER): Payer: BC Managed Care – PPO | Admitting: Psychiatry

## 2022-10-07 ENCOUNTER — Encounter: Payer: Self-pay | Admitting: Psychiatry

## 2022-10-07 DIAGNOSIS — F4000 Agoraphobia, unspecified: Secondary | ICD-10-CM

## 2022-10-07 DIAGNOSIS — F4010 Social phobia, unspecified: Secondary | ICD-10-CM | POA: Diagnosis not present

## 2022-10-07 DIAGNOSIS — Z9989 Dependence on other enabling machines and devices: Secondary | ICD-10-CM

## 2022-10-07 DIAGNOSIS — Z634 Disappearance and death of family member: Secondary | ICD-10-CM | POA: Diagnosis not present

## 2022-10-07 DIAGNOSIS — G4730 Sleep apnea, unspecified: Secondary | ICD-10-CM

## 2022-10-07 DIAGNOSIS — F3342 Major depressive disorder, recurrent, in full remission: Secondary | ICD-10-CM | POA: Diagnosis not present

## 2022-10-07 DIAGNOSIS — G4701 Insomnia due to medical condition: Secondary | ICD-10-CM

## 2022-10-07 MED ORDER — BUPROPION HCL 75 MG PO TABS
75.0000 mg | ORAL_TABLET | Freq: Every morning | ORAL | 1 refills | Status: DC
Start: 2022-10-07 — End: 2023-04-03

## 2022-10-07 NOTE — Progress Notes (Signed)
Virtual Visit via Video Note  I connected with Nicole Cervantes on 10/07/22 at 11:00 AM EDT by a video enabled telemedicine application and verified that I am speaking with the correct person using two identifiers.  Location Provider Location : [facility] Patient Location : Work  Participants: Patient , Provider    I discussed the limitations of evaluation and management by telemedicine and the availability of in person appointments. The patient expressed understanding and agreed to proceed.   I discussed the assessment and treatment plan with the patient. The patient was provided an opportunity to ask questions and all were answered. The patient agreed with the plan and demonstrated an understanding of the instructions.   The patient was advised to call back or seek an in-person evaluation if the symptoms worsen or if the condition fails to improve as anticipated.  BH MD OP Progress Note  10/07/2022 11:12 AM Nicole Cervantes  MRN:  [id number]  Chief Complaint:  Chief Complaint  Patient presents with   Follow-up   Depression   Anxiety   Medication Refill   HPI: Nicole Cervantes is a 47 year old Caucasian female, married, employed, lives in [place], has a history of MDD, agoraphobia, anxiety disorder, bereavement, hypertension, diabetes mellitus was evaluated by telemedicine today.  Patient today reports she is currently doing well with regards to her mood symptoms.  She denies any sadness, anhedonia.  She reports she is able to focus well at work.  Work is going good.  She does have ongoing anxiety mostly social anxiety, anxiety leaving her home.  She however is currently working with her therapist Ms. Doree Barthel every 3 weeks.  She reports she joined a [employer] and has been going for the book club alone.  She also was able to go for a cookout recently however her husband was with her at that time.  She reports she has been trying to work on her anxiety and that has been helpful.  She does have  situational anxiety since she is currently working on buying the home where she lives now.  Patient reports the closing is on the 10th and that this make her anxious.  She reports that has been making her sleep restless the past few nights.  She however has been trying to work on mindfulness strategies.  She also uses Requip for restless leg symptoms as needed and that helps.  Currently being prescribed by primary care provider.  She is also compliant on the CPAP.  Patient currently denies any suicidality, homicidality or perceptual disturbances.  Patient is compliant on Wellbutrin, sertraline.  Denies side effects.  Patient denies any other concerns today.  Visit Diagnosis:    ICD-10-CM   1. Recurrent major depressive disorder, in full remission (HCC)  F33.42 buPROPion (WELLBUTRIN) 75 MG tablet    2. Agoraphobia  F40.00     3. Social anxiety disorder  F40.10     4. Insomnia due to medical condition  G47.01    Sleep apnea on CPAP    5. Bereavement  Z63.4       Past Psychiatric History: I have reviewed past psychiatric history from my progress note on 11/28/2021.  Past trials of medications like Paxil, Lamictal, Wellbutrin, Seroquel.  Past Medical History:  Past Medical History:  Diagnosis Date   Allergy    Anxiety    Back pain    Depression    Diabetes mellitus without complication (HCC)    Hypertension    DR. GR[facility] - [facility]   Migraines     Past Surgical History:  Procedure Laterality Date   ABDOMINAL HYSTERECTOMY  06-02-07   IN [place], [place] - TOTAL EXCEPT OVARIES, LEIOMYOMA   COLONOSCOPY  2006   DX   Cyst removal to lower back     LAPAROSCOPY  2000   BURNED HOLES IN OVARIES TO RELEASE PRESSURE FROM CYSTS.  DR. BURNS, [place]   OVARIAN CYST REMOVAL     SKIN BIOPSY     TONSILLECTOMY      Family Psychiatric History: I have reviewed family psychiatric history from progress note on 11/28/2021.  Family History:  Family History  Problem Relation Age of Onset    Diabetes Mother        passed away 06/01/21   Hypertension Mother    Depression Mother    Stroke Mother    Throat cancer Mother    Alcohol abuse Father    Prostate cancer Father 6   Diabetes Father    Hypertension Father    Bipolar disorder Sister    Bladder Cancer Sister 40       in remission   Lupus Sister    Bipolar disorder Sister    Depression Maternal Aunt    Depression Maternal Uncle    Alcohol abuse Maternal Grandfather    Depression Maternal Grandfather    Alcohol abuse Maternal Grandmother    Heart disease Maternal Grandmother    Depression Maternal Grandmother    Bipolar disorder Other     Social History: I have reviewed social history from progress note on 11/28/2021. Social History   Socioeconomic History   Marital status: Married    Spouse name: Not on file   Number of children: 1   Years of education: 14   Highest education level: Associate degree: academic program  Occupational History    Employer: [employer]  Tobacco Use   Smoking status: Former    Current packs/day: 0.00    Types: Cigarettes    Start date: 1989    Quit date: 2009    Years since quitting: 15.6   Smokeless tobacco: Never  Vaping Use   Vaping status: Never Used  Substance and Sexual Activity   Alcohol use: Yes    Comment: occasionally once a month   Drug use: No   Sexual activity: Yes    Birth control/protection: Surgical    Comment: Hysterectomy   Other Topics Concern   Not on file  Social History Narrative   Not on file   Social Determinants of Health   Financial Resource Strain: Low Risk  (02/13/2021)   Received from [facility], [facility]   Overall Financial Resource Strain (CARDIA)    Difficulty of Paying Living Expenses: Not hard at all  Food Insecurity: No Food Insecurity (02/13/2021)   [facility], [facility]   Hunger Vital Sign    Worried About Running Out of Food in the Last Year: Never true    Ran Out of Food in the  Last Year: Never true  Transportation Needs: No Transportation Needs (02/13/2021)   Received from [facility], [facility]   PRAPARE - Transportation    Lack of Transportation (Medical): No    Lack of Transportation (Non-Medical): No  Physical Activity: Not on file  Stress: Not on file  Social Connections: Not on file    Allergies:  Allergies  Allergen Reactions   Ozempic (0.25 Or 0.5 Mg-Dose) [Semaglutide(0.25 Or 0.5mg -Dos)] Other (  See Comments)    heartburn    Metabolic Disorder Labs: Lab Results  Component Value Date   HGBA1C 6.1 05/23/2022   MPG 117 10/25/2015   No results found for: "PROLACTIN" Lab Results  Component Value Date   CHOL 130 05/23/2022   TRIG 106.0 05/23/2022   HDL 45.50 05/23/2022   CHOLHDL 3 05/23/2022   VLDL 21.2 05/23/2022   LDLCALC 63 05/23/2022   LDLCALC 133 (H) 02/18/2022   Lab Results  Component Value Date   TSH 2.19 11/08/2021   TSH 4.845 (H) 10/25/2015    Therapeutic Level Labs: No results found for: "LITHIUM" No results found for: "VALPROATE" No results found for: "CBMZ"  Current Medications: Current Outpatient Medications  Medication Sig Dispense Refill   ACCU-CHEK GUIDE test strip USE TO CHECK BLOOD SUGAR UP TO 4 TIMES DAILY AS DIRECTED     Accu-Chek Softclix Lancets lancets SMARTSIG:Topical 1-4 Times Daily     amLODipine (NORVASC) 10 MG tablet Take 1 tablet (10 mg total) by mouth daily. 90 tablet 1   atorvastatin (LIPITOR) 10 MG tablet Take 1 tablet (10 mg total) by mouth daily. 90 tablet 3   blood glucose meter kit and supplies Dispense based on patient and insurance preference. Use up to four times daily as directed. (FOR ICD-10 E10.9, E11.9). 1 each 0   buPROPion (WELLBUTRIN) 75 MG tablet Take 1 tablet (75 mg total) by mouth in the morning. 90 tablet 1   clobetasol cream (TEMOVATE) 0.05 % Apply 1 Application topically 2 (two) times daily. 30 g 11   clotrimazole-betamethasone (LOTRISONE) cream Apply externally BID prn  sx up to 2 wks 15 g 1   famotidine (PEPCID) 20 MG tablet Take 1 tablet (20 mg total) by mouth daily as needed.     loratadine (CLARITIN) 10 MG tablet Take 1 tablet by mouth daily.     losartan (COZAAR) 50 MG tablet Take 1 tablet by mouth once daily 90 tablet 3   metFORMIN (GLUCOPHAGE) 500 MG tablet Take 1 tablet (500 mg total) by mouth 2 (two) times daily with a meal. 180 tablet 3   MOUNJARO 2.5 MG/0.5ML Pen INJECT 2.5 MG INTO THE SKIN ONCE A WEEK 4 mL 0   ondansetron (ZOFRAN-ODT) 4 MG disintegrating tablet Take 1 tablet (4 mg total) by mouth every 8 (eight) hours as needed for nausea or vomiting. 20 tablet 0   rOPINIRole (REQUIP) 0.25 MG tablet      sertraline (ZOLOFT) 100 MG tablet Take two tablets once daily 180 tablet 1   SUMAtriptan (IMITREX) 100 MG tablet Take by mouth.     triamcinolone cream (KENALOG) 0.1 % Apply 1 Application topically 2 (two) times daily as needed.     ALPRAZolam (XANAX) 0.5 MG tablet Take 0.5 mg by mouth daily as needed. (Patient not taking: Reported on 10/07/2022)     omeprazole (PRILOSEC) 40 MG capsule Take 1 capsule (40 mg total) by mouth daily for 14 days. 14 capsule 0   No current facility-administered medications for this visit.     Musculoskeletal: Strength & Muscle Tone:  UTA Gait & Station: normal Patient leans: N/A  Psychiatric Specialty Exam: Review of Systems  Psychiatric/Behavioral:  Positive for sleep disturbance. The patient is nervous/anxious.     There were no vitals taken for this visit.There is no height or weight on file to calculate BMI.  General Appearance: Fairly Groomed  Eye Contact:  Fair  Speech:  Clear and Coherent  Volume:  Normal  Mood:  Anxious  Affect:  Congruent  Thought Process:  Goal Directed and Descriptions of Associations: Intact  Orientation:  Full (Time, Place, and Person)  Thought Content: Logical   Suicidal Thoughts:  No  Homicidal Thoughts:  No  Memory:  Immediate;   Fair Recent;   Fair Remote;   Fair   Judgement:  Fair  Insight:  Fair  Psychomotor Activity:  Normal  Concentration:  Concentration: Fair and Attention Span: Fair  Recall:  [employer] of Knowledge: Fair  Language: Fair  Akathisia:  No  Handed:  Right  AIMS (if indicated): not done  Assets:  Communication Skills Desire for Improvement Housing Intimacy Social Support Transportation  ADL's:  Intact  Cognition: WNL  Sleep:   Restless due to anxiety, situational   Screenings: AUDIT    Flowsheet Row Admission (Discharged) from 10/24/[facility] in [facility]  Alcohol Use Disorder Identification Test Final Score (AUDIT) 0      GAD-7    Flowsheet Row Office Visit from 05/15/2022 in [facility] [place] Office Visit from 03/19/2022 in [facility] Office Visit from 11/28/2021 in [facility] Office Visit from 11/05/2021 in [facility]  Total GAD-7 Score 0 0 4 9      PHQ2-9    Flowsheet Row Video Visit from 10/07/2022 in [facility] Video Visit from 07/03/2022 in [facility] Office Visit from 05/23/2022 in [facility] Office Visit from 05/15/2022 in [facility] [place] Office Visit from 03/19/2022 in [facility]  PHQ-2 Total Score 0 0 0 0 1  PHQ-9 Total Score -- -- -- -- 4      Flowsheet Row Video Visit from 10/07/2022 in [facility] Video Visit from 07/03/2022 in [facility] Office Visit from 03/19/2022 in [facility]  C-SSRS RISK CATEGORY Low Risk No Risk Low Risk        Assessment and Plan: Nicole Cervantes is a 47 year old Caucasian female, employed, married, lives  in [place], has a history of depression, anxiety was evaluated by telemedicine today.  Patient is currently stable.  Plan as noted below.  Plan MDD in remission Sertraline 100 mg p.o. daily Wellbutrin 75 mg p.o. daily-patient reports she is currently on this dose. Continue CBT with Ms. Doree Barthel  Agoraphobia-improving Continue CBT.  Bereavement-stable Continue grief counseling  Social anxiety disorder-improving Sertraline 100 mg p.o. daily Continue CBT  Insomnia-unstable Currently restless due to situational anxiety, patient to work on mindfulness strategies, relaxation techniques. Continue CPAP for OSA   Follow-up in clinic in 3 to 4 months in person or sooner if needed.     Collaboration of Care: Collaboration of Care: Referral or follow-up with counselor/therapist AEB patient Nicole Cervantes to continue CBT.  Patient/Guardian was advised Release of Information must be obtained prior to any record release in order to collaborate their care with an outside provider. Patient/Guardian was advised if they have not already done so to contact the registration department to sign all necessary forms in order for [place] to release information regarding their care.   Consent: Patient/Guardian gives verbal consent for treatment and assignment of benefits for services provided during this visit. Patient/Guardian expressed understanding and agreed to proceed.   This note was generated in part  or whole with voice recognition software. Voice recognition is usually quite accurate but there are transcription errors that can and very often do occur. I apologize for any typographical errors that were not detected and corrected.     Jomarie Longs, MD 10/08/2022, 2:40 PM

## 2022-10-10 ENCOUNTER — Telehealth: Payer: Self-pay | Admitting: Family

## 2022-10-10 NOTE — Telephone Encounter (Signed)
Patient needs PA for   [facility] 2.5 MG/0.5ML Pen through her insurance

## 2022-10-14 ENCOUNTER — Other Ambulatory Visit (HOSPITAL_COMMUNITY): Payer: Self-pay

## 2022-10-14 NOTE — Telephone Encounter (Signed)
Per test claim, Mounjaro 2.5mg /0.30ml has been dispensed within the past 180 days. Insurance expects pt to increase dose for this and future fills. Mounjaro 5mg /0.55ml is covered with a charge of $45.00. Please increase dose, if appropriate, or advise.

## 2022-10-16 ENCOUNTER — Ambulatory Visit: Payer: BC Managed Care – PPO | Admitting: Family

## 2022-10-16 ENCOUNTER — Encounter: Payer: Self-pay | Admitting: Family

## 2022-10-16 VITALS — BP 130/84 | HR 77 | Temp 98.0°F | Wt 249.0 lb

## 2022-10-16 DIAGNOSIS — G2581 Restless legs syndrome: Secondary | ICD-10-CM

## 2022-10-16 DIAGNOSIS — Z6841 Body Mass Index (BMI) 40.0 and over, adult: Secondary | ICD-10-CM

## 2022-10-16 DIAGNOSIS — Z23 Encounter for immunization: Secondary | ICD-10-CM | POA: Diagnosis not present

## 2022-10-16 DIAGNOSIS — E782 Mixed hyperlipidemia: Secondary | ICD-10-CM

## 2022-10-16 DIAGNOSIS — H60501 Unspecified acute noninfective otitis externa, right ear: Secondary | ICD-10-CM

## 2022-10-16 DIAGNOSIS — H9201 Otalgia, right ear: Secondary | ICD-10-CM

## 2022-10-16 DIAGNOSIS — E119 Type 2 diabetes mellitus without complications: Secondary | ICD-10-CM | POA: Diagnosis not present

## 2022-10-16 MED ORDER — CIPROFLOXACIN-DEXAMETHASONE 0.3-0.1 % OT SUSP
4.0000 [drp] | Freq: Two times a day (BID) | OTIC | 0 refills | Status: DC
Start: 2022-10-16 — End: 2023-02-09

## 2022-10-16 MED ORDER — ROPINIROLE HCL 0.25 MG PO TABS
0.2500 mg | ORAL_TABLET | Freq: Every day | ORAL | 3 refills | Status: DC
Start: 2022-10-16 — End: 2023-09-29

## 2022-10-16 NOTE — Patient Instructions (Addendum)
  Restart b12 at 500 mcg once daily over the counter.    A referral was placed today for medical weight management.  Please let us know if you have not heard back within 2 weeks about the referral.

## 2022-10-16 NOTE — Progress Notes (Signed)
Established Patient Office Visit  Subjective:      CC:  Chief Complaint  Patient presents with   Follow-up    Hypertension    HPI: Nicole Cervantes is a 47 y.o. female presenting on 10/16/2022 for Follow-up (Hypertension) . DM2: metformin 500 mg twice daily.  Recently started on mounjaro and since starting she developed a chest rash and has had since. She ran out of the medication about one week ago, but still in her system and still with rash. The rash is very itchy . She did also try ozempic but had chest pains.  Lab Results  Component Value Date   HGBA1C 6.1 05/23/2022   HTN: on amlodpine 10 mg and losartan 10 mg once daily.   Last visit increased zoloft to 200 mg, pt states psychiatrist advised that I fill this for her since I increased dose. Will continue with 200 mg as well as on bupropion 75 mg. She does state she felt a lot better once we increased the sertraline.   Restless legs syndrome, takes requip as needed. Taking 0.25 mg prn, she uses a few times a month. She does not take this daily as was prescribed but she will change to this. She is not sure why she started taking it prn.       Social history:  Relevant past medical, surgical, family and social history reviewed and updated as indicated. Interim medical history since our last visit reviewed.  Allergies and medications reviewed and updated.  DATA REVIEWED: CHART IN EPIC     ROS: Negative unless specifically indicated above in HPI.    Current Outpatient Medications:    ACCU-CHEK GUIDE test strip, USE TO CHECK BLOOD SUGAR UP TO 4 TIMES DAILY AS DIRECTED, Disp: , Rfl:    Accu-Chek Softclix Lancets lancets, SMARTSIG:Topical 1-4 Times Daily, Disp: , Rfl:    amLODipine (NORVASC) 10 MG tablet, Take 1 tablet (10 mg total) by mouth daily., Disp: 90 tablet, Rfl: 1   atorvastatin (LIPITOR) 10 MG tablet, Take 1 tablet (10 mg total) by mouth daily., Disp: 90 tablet, Rfl: 3   blood glucose meter kit and supplies,  Dispense based on patient and insurance preference. Use up to four times daily as directed. (FOR ICD-10 E10.9, E11.9)., Disp: 1 each, Rfl: 0   buPROPion (WELLBUTRIN) 75 MG tablet, Take 1 tablet (75 mg total) by mouth in the morning., Disp: 90 tablet, Rfl: 1   ciprofloxacin-dexamethasone (CIPRODEX) OTIC suspension, Place 4 drops into the right ear 2 (two) times daily., Disp: 7.5 mL, Rfl: 0   clobetasol cream (TEMOVATE) 0.05 %, Apply 1 Application topically 2 (two) times daily., Disp: 30 g, Rfl: 11   clotrimazole-betamethasone (LOTRISONE) cream, Apply externally BID prn sx up to 2 wks, Disp: 15 g, Rfl: 1   famotidine (PEPCID) 20 MG tablet, Take 1 tablet (20 mg total) by mouth daily as needed., Disp: , Rfl:    loratadine (CLARITIN) 10 MG tablet, Take 1 tablet by mouth daily., Disp: , Rfl:    losartan (COZAAR) 50 MG tablet, Take 1 tablet by mouth once daily, Disp: 90 tablet, Rfl: 3   metFORMIN (GLUCOPHAGE) 500 MG tablet, Take 1 tablet (500 mg total) by mouth 2 (two) times daily with a meal., Disp: 180 tablet, Rfl: 3   ondansetron (ZOFRAN-ODT) 4 MG disintegrating tablet, Take 1 tablet (4 mg total) by mouth every 8 (eight) hours as needed for nausea or vomiting., Disp: 20 tablet, Rfl: 0   sertraline (ZOLOFT) 100 MG  tablet, Take two tablets once daily, Disp: 180 tablet, Rfl: 1   SUMAtriptan (IMITREX) 100 MG tablet, Take by mouth., Disp: , Rfl:    triamcinolone cream (KENALOG) 0.1 %, Apply 1 Application topically 2 (two) times daily as needed., Disp: , Rfl:    rOPINIRole (REQUIP) 0.25 MG tablet, Take 1 tablet (0.25 mg total) by mouth at bedtime., Disp: 90 tablet, Rfl: 3      Objective:    BP 130/84 (BP Location: Right Arm, Patient Position: Sitting, Cuff Size: Normal)   Pulse 77   Temp 98 F (36.7 C) (Temporal)   Wt 249 lb (112.9 kg)   SpO2 99%   BMI 42.74 kg/m   Wt Readings from Last 3 Encounters:  10/16/22 249 lb (112.9 kg)  05/23/22 245 lb 9.6 oz (111.4 kg)  05/15/22 246 lb 11.2 oz (111.9  kg)    Physical Exam Constitutional:      General: She is not in acute distress.    Appearance: Normal appearance. She is normal weight. She is not ill-appearing, toxic-appearing or diaphoretic.  HENT:     Head: Normocephalic.     Ears:     Comments: Right internal ear canal with erythema and slight swelling Cardiovascular:     Rate and Rhythm: Normal rate and regular rhythm.  Pulmonary:     Effort: Pulmonary effort is normal.     Breath sounds: Normal breath sounds.  Musculoskeletal:        General: Normal range of motion.  Skin:    Comments: Left medial rash breast with erythema and excoriation   Neurological:     General: No focal deficit present.     Mental Status: She is alert and oriented to person, place, and time. Mental status is at baseline.  Psychiatric:        Mood and Affect: Mood normal.        Behavior: Behavior normal.        Thought Content: Thought content normal.        Judgment: Judgment normal.           Assessment & Plan:  Restless leg syndrome Assessment & Plan: Unintentionally non compliant  Start taking requip nightly vs prn    Orders: -     rOPINIRole HCl; Take 1 tablet (0.25 mg total) by mouth at bedtime.  Dispense: 90 tablet; Refill: 3  Diabetes mellitus without complication (HCC) Assessment & Plan: Reviewed last A1c Discussed with pt nutrition and exercise Referral placed for medical weight management.  Unable to tolerate GLP 1  Orders: -     Amb Ref to Medical Weight Management  Mixed hyperlipidemia -     Amb Ref to Medical Weight Management  BMI 40.0-44.9, adult (HCC) -     Amb Ref to Medical Weight Management  Otalgia of right ear  Acute otitis externa of right ear, unspecified type Assessment & Plan: Rx ciprodex Pt to f/u if sx worsen and or fail to improve in 2-3 days.   Orders: -     Ciprofloxacin-dexAMETHasone; Place 4 drops into the right ear 2 (two) times daily.  Dispense: 7.5 mL; Refill: 0  Encounter for  immunization -     Flu vaccine trivalent PF, 6mos and older(Flulaval,Afluria,Fluarix,Fluzone)     Return in about 6 months (around 04/15/2023) for f/u CPE.  Mort Sawyers, MSN, APRN, FNP-C [facility]

## 2022-10-17 DIAGNOSIS — H60501 Unspecified acute noninfective otitis externa, right ear: Secondary | ICD-10-CM | POA: Insufficient documentation

## 2022-10-17 DIAGNOSIS — G2581 Restless legs syndrome: Secondary | ICD-10-CM | POA: Insufficient documentation

## 2022-10-17 DIAGNOSIS — H9201 Otalgia, right ear: Secondary | ICD-10-CM | POA: Insufficient documentation

## 2022-10-17 NOTE — Assessment & Plan Note (Signed)
Rx ciprodex Pt to f/u if sx worsen and or fail to improve in 2-3 days.

## 2022-10-17 NOTE — Assessment & Plan Note (Signed)
Unintentionally non compliant  Start taking requip nightly vs prn

## 2022-10-17 NOTE — Assessment & Plan Note (Signed)
Reviewed last A1c Discussed with pt nutrition and exercise Referral placed for medical weight management.  Unable to tolerate GLP 1

## 2022-10-20 ENCOUNTER — Other Ambulatory Visit (HOSPITAL_COMMUNITY): Payer: Self-pay

## 2022-10-28 ENCOUNTER — Other Ambulatory Visit: Payer: Self-pay | Admitting: Family

## 2022-10-28 DIAGNOSIS — E119 Type 2 diabetes mellitus without complications: Secondary | ICD-10-CM

## 2022-10-30 ENCOUNTER — Ambulatory Visit: Payer: BC Managed Care – PPO | Admitting: Clinical

## 2022-10-30 DIAGNOSIS — F3341 Major depressive disorder, recurrent, in partial remission: Secondary | ICD-10-CM

## 2022-10-30 NOTE — Progress Notes (Signed)
                Dezi Schaner, LCSW 

## 2022-10-30 NOTE — Progress Notes (Signed)
[facility] Counselor/Therapist Progress Note  Patient ID: Nicole Cervantes, MRN: [id number],    Date: 10/30/2022  Time Spent: 12:33pm - 1:29pm : 56 minutes   Treatment Type: Individual Therapy  Reported Symptoms: none reported  Mental Status Exam: Appearance:  Neat and Well Groomed     Behavior: Appropriate  Motor: Normal  Speech/Language:  Clear and Coherent  Affect: Appropriate  Mood: normal  Thought process: normal  Thought content:   WNL  Sensory/Perceptual disturbances:   WNL  Orientation: oriented to person, place, and situation  Attention: Good  Concentration: Good  Memory: WNL  Fund of knowledge:  Good  Insight:   Good  Judgment:  Good  Impulse Control: Good   Risk Assessment: Danger to Self:  No Patient denied current suicidal ideation  Self-injurious Behavior: No Danger to Others: No Patient denied current homicidal ideation Duty to Warn:no Physical Aggression / Violence:No  Access to Firearms a concern: No  Gang Involvement:No   Subjective: Patient stated, "fantastic" in response to events since last session. Patient reported patient/husband recently closed on the purchase of their home. Patient reported she received a ring containing patient's mother's ashes and stated, "its making me very happy" in response to patient's ring. Patient reported she recently received a raise at work and reported patient/husband will be going on a cruise in May.  Patient reported she continues to work overtime to address financial stressors. Patient reported her husband remains out of work due to medical issues and was recently approved for social security disability. Patient stated, "it has been rough" and stated, "it does bother me" in response to husband's limitations walking. Patient stated, "very good" in response to patient's mood since last session. Patient reported she continues to participate in a book club and has been spending time with family/friends  frequently. Patient stated,  "I think my social anxieties are definitely getting better". Patient stated, "it feels good" in response to attending social events. Patient reported she continues to experience anxiety prior to attending social events but reported she is no longer declining invitations to events. Patient stated, "it feels good to be included" in response to participation in social events. Patient reported she has not been completing self esteem journal consistently due to feeling tired. Patient reported she is currently reading a book on positive affirmations.   Interventions: Cognitive Behavioral Therapy. Clinician conducted session in person from clinician's office at [facility]. Reviewed events since last session and the status of recent stressors. Assisted patient in exploring and identifying patient's thoughts/feelings triggered by changes in husband's health and mobility. Assessed patient's mood since last session and assessed patient's current mood. Assessed intensity and frequency of symptoms of anxiety associated with social interactions, discussed strategies patient has implemented in response, and the outcome. Reviewed patient's self esteem journal and barriers to completing self esteem journal. Provided psycho education related to cognitive restructuring. Provided psycho education related to use of a gratitude journal and breaking completion of self esteem journal/gratitude journal into smaller steps to establish a habit of completing journal. Provided reflective listening and validated patient's progress. Clinician requested for homework patient continue self esteem journal/gratitude journal.   Collaboration of Care: Other is not required at this time.    Diagnosis: Major Depressive Disorder, recurrent, in partial remission   Plan: Patient is to utilize [employer], thought re-framing, relaxation techniques, effective communication strategies, dialectical  behavior therapy skills, and coping strategies to decrease symptoms associated with Major Depressive Disorder. Frequency: biweekly  Modality: individual      Long-term goal:   Patient stated, "to be happy again" and "be a positive person".    Reduce overall level, frequency, and intensity of the feelings of depression as evidenced by decreased irritability, easily angered, increased appetite, difficulty getting out of bed due to fatigue, going to bed and staying in bed after dinner, decrease in hygiene, sleeping late in the mornings, waking up in the middle of the night multiple times, lack of energy, change in concentration, difficulty recalling information at work, taking longer to complete tasks, "melancholy" mood, loss of interest, feelings of guilt, apologizing frequently, and symptoms of anxiety from 7 days per week to 0 to 1 days per week per patient report for at least 1 month.   Target Date: 01/17/23  Progress: progressing    Short-term goal:  Verbalize an understanding of the relationship between depressive symptoms and patient's communication with husband per patient's report.   Target Date: 06/17/22  Progress: Patient reported she feels this goal has been met.     Develop effective communication strategies for patient to utilize when expressing her thoughts and feelings to others in a controlled and assertive way  Target Date: 01/17/23  Progress: progressing    Verbally express an understanding of the relationship between feelings of depression, anxiety and the impact on thought patterns and behaviors.   Target Date: 01/17/23  Progress: progressing    Identify triggers for anger/irritability and develop coping strategies to utilize in response to feelings of anger/irritability to reduce anger outbursts per patient's report  Target Date: 06/17/22  Progress: patient reported she feels this goal has been met    Identify, challenge, and replace negative thought patterns and  negative self talk that contribute to feelings of depression and anxiety with positive thoughts, beliefs, and positive self talk per patient's report   Target Date: 01/17/23  Progress: progressing    Develop and implement emotion regulation skills to decrease fluctuations in mood, such as, anger, irritability, anxiety, and depression  Target Date: 06/17/22  Progress: Patient reported she feels this goal has been met.      Doree Barthel, LCSW

## 2022-11-17 DIAGNOSIS — M542 Cervicalgia: Secondary | ICD-10-CM | POA: Diagnosis not present

## 2022-11-17 DIAGNOSIS — M25512 Pain in left shoulder: Secondary | ICD-10-CM | POA: Diagnosis not present

## 2022-11-20 ENCOUNTER — Ambulatory Visit: Payer: BC Managed Care – PPO | Admitting: Clinical

## 2022-11-20 DIAGNOSIS — F3341 Major depressive disorder, recurrent, in partial remission: Secondary | ICD-10-CM | POA: Diagnosis not present

## 2022-11-20 NOTE — Progress Notes (Signed)
                Dezi Schaner, LCSW 

## 2022-11-20 NOTE — Progress Notes (Signed)
[facility] Counselor/Therapist Progress Note  Patient ID: Nicole Cervantes, MRN: [id number],    Date: 11/20/2022  Time Spent: 12:31pm - 1:34pm : 63 minutes   Treatment Type: Individual Therapy  Reported Symptoms: Patient reported she is currently experiencing pain in her neck  Mental Status Exam: Appearance:  Neat and Well Groomed     Behavior: Appropriate  Motor: Normal  Speech/Language:  Clear and Coherent  Affect: Appropriate  Mood: normal  Thought process: normal  Thought content:   WNL  Sensory/Perceptual disturbances:   Patient reported currently experiencing pain in her neck  Orientation: oriented to person, place, and situation  Attention: Good  Concentration: Good  Memory: WNL  Fund of knowledge:  Good  Insight:   Good  Judgment:  Good  Impulse Control: Good   Risk Assessment: Danger to Self:  No Patient denied current suicidal ideation  Self-injurious Behavior: No Danger to Others: No Patient denied current homicidal ideation Duty to Warn:no Physical Aggression / Violence:No  Access to Firearms a concern: No  Gang Involvement:No   Subjective: Patient stated, "good" in response to events since last session. Patient reported she currently has a pinched nerve in her neck and reported a diagnosis of degenerative disc disease in her neck that is progressing. Patient stated, "by the end of the days I'm in tears" in reference to pain. Patient reported she has an appointment with a pain management provider tomorrow. Patient reported there have been management changes at patient's place of employment. Patient reported one of the managers is implementing new policies and patient reported she is concerned about potential changes related to their attendance policy. Patient stated, "I don't think its truly affecting my mood yet" and stated,  "I can't wait to get out of work every day". Patient reported employees were informed employees are being watched at their  place of employment and being written up. Patient stated, "its ok" in response to patient's self esteem/gratitude journal. During today's session, patient shared several self esteem/gratitude journal entries. Patient reported she recently received an invitation to a party and declined the invitation due to concern related to spouse's vision and the possibility of spouse driving at night.  Patient stated, "positive" in response to patient's mood since last session. .   Interventions: Cognitive Behavioral Therapy. Clinician conducted session in person from clinician's office at [facility]. Reviewed events since last session. Provided supportive therapy, active listening, and validation as patient discussed recent medical concerns and changes in patient's work environment. Used clarification to better understand patient's experience and assist patient in discussing, exploring, and examining patient's thoughts and feelings related to changes in patient's work environment and the impact on patient. Discussed patient's recent invitation to a social event, patient's thoughts/feelings in regards to the invitation, and barriers to patient attending the event.  Clinician requested for homework patient continue self esteem journal/gratitude journal.   Collaboration of Care: Other is not required at this time.    Diagnosis: Major Depressive Disorder, recurrent, in partial remission   Plan: Patient is to utilize [employer], thought re-framing, relaxation techniques, effective communication strategies, dialectical behavior therapy skills, and coping strategies to decrease symptoms associated with Major Depressive Disorder. Frequency: biweekly  Modality: individual      Long-term goal:   Patient stated, "to be happy again" and "be a positive person".    Reduce overall level, frequency, and intensity of the feelings of depression as evidenced by decreased irritability, easily angered,  increased appetite, difficulty getting  out of bed due to fatigue, going to bed and staying in bed after dinner, decrease in hygiene, sleeping late in the mornings, waking up in the middle of the night multiple times, lack of energy, change in concentration, difficulty recalling information at work, taking longer to complete tasks, "melancholy" mood, loss of interest, feelings of guilt, apologizing frequently, and symptoms of anxiety from 7 days per week to 0 to 1 days per week per patient report for at least 1 month.   Target Date: 01/17/23  Progress: progressing    Short-term goal:  Verbalize an understanding of the relationship between depressive symptoms and patient's communication with husband per patient's report.   Target Date: 06/17/22  Progress: Patient reported she feels this goal has been met.     Develop effective communication strategies for patient to utilize when expressing her thoughts and feelings to others in a controlled and assertive way  Target Date: 01/17/23  Progress: progressing    Verbally express an understanding of the relationship between feelings of depression, anxiety and the impact on thought patterns and behaviors.   Target Date: 01/17/23  Progress: progressing    Identify triggers for anger/irritability and develop coping strategies to utilize in response to feelings of anger/irritability to reduce anger outbursts per patient's report  Target Date: 06/17/22  Progress: patient reported she feels this goal has been met    Identify, challenge, and replace negative thought patterns and negative self talk that contribute to feelings of depression and anxiety with positive thoughts, beliefs, and positive self talk per patient's report   Target Date: 01/17/23  Progress: progressing    Develop and implement emotion regulation skills to decrease fluctuations in mood, such as, anger, irritability, anxiety, and depression  Target Date: 06/17/22  Progress: Patient  reported she feels this goal has been met.        Doree Barthel, LCSW

## 2022-11-21 ENCOUNTER — Other Ambulatory Visit: Payer: Self-pay | Admitting: Family Medicine

## 2022-11-21 DIAGNOSIS — M5412 Radiculopathy, cervical region: Secondary | ICD-10-CM

## 2022-11-21 DIAGNOSIS — M4802 Spinal stenosis, cervical region: Secondary | ICD-10-CM | POA: Diagnosis not present

## 2022-11-21 DIAGNOSIS — M5030 Other cervical disc degeneration, unspecified cervical region: Secondary | ICD-10-CM | POA: Diagnosis not present

## 2022-11-21 DIAGNOSIS — M25512 Pain in left shoulder: Secondary | ICD-10-CM | POA: Diagnosis not present

## 2022-11-22 ENCOUNTER — Ambulatory Visit
Admission: RE | Admit: 2022-11-22 | Discharge: 2022-11-22 | Disposition: A | Discharge status: Home / Self Care | Payer: BC Managed Care – PPO | Source: Ambulatory Visit | Attending: Family Medicine | Admitting: Family Medicine

## 2022-11-22 DIAGNOSIS — M5412 Radiculopathy, cervical region: Secondary | ICD-10-CM

## 2022-11-28 DIAGNOSIS — M5412 Radiculopathy, cervical region: Secondary | ICD-10-CM | POA: Diagnosis not present

## 2022-11-28 DIAGNOSIS — M4802 Spinal stenosis, cervical region: Secondary | ICD-10-CM | POA: Diagnosis not present

## 2022-12-01 ENCOUNTER — Emergency Department
Admission: EM | Admit: 2022-12-01 | Discharge: 2022-12-01 | Disposition: A | Discharge status: Home / Self Care | Payer: BC Managed Care – PPO | Attending: Emergency Medicine | Admitting: Emergency Medicine

## 2022-12-01 ENCOUNTER — Other Ambulatory Visit: Payer: Self-pay

## 2022-12-01 ENCOUNTER — Encounter: Payer: Self-pay | Admitting: Emergency Medicine

## 2022-12-01 ENCOUNTER — Emergency Department: Payer: BC Managed Care – PPO

## 2022-12-01 DIAGNOSIS — I10 Essential (primary) hypertension: Secondary | ICD-10-CM | POA: Diagnosis not present

## 2022-12-01 DIAGNOSIS — R202 Paresthesia of skin: Secondary | ICD-10-CM | POA: Diagnosis not present

## 2022-12-01 DIAGNOSIS — E119 Type 2 diabetes mellitus without complications: Secondary | ICD-10-CM | POA: Diagnosis not present

## 2022-12-01 DIAGNOSIS — R001 Bradycardia, unspecified: Secondary | ICD-10-CM | POA: Diagnosis not present

## 2022-12-01 DIAGNOSIS — R791 Abnormal coagulation profile: Secondary | ICD-10-CM | POA: Diagnosis not present

## 2022-12-01 LAB — ETHANOL: Alcohol, Ethyl (B): <10 mg/dL (ref <10)

## 2022-12-01 LAB — APTT: aPTT: 29 s (ref 24–36)

## 2022-12-01 LAB — COMPREHENSIVE METABOLIC PANEL
ALT: 29 U/L (ref 0–44)
AST: 27 U/L (ref 15–41)
Albumin: 3.8 g/dL (ref 3.5–5.0)
Alkaline Phosphatase: 79 U/L (ref 38–126)
Anion gap: 11 (ref 5–15)
BUN: 15 mg/dL (ref 6–20)
CO2: 24 mmol/L (ref 22–32)
Calcium: 8.9 mg/dL (ref 8.9–10.3)
Chloride: 101 mmol/L (ref 98–111)
Creatinine, Ser: 0.64 mg/dL (ref 0.44–1.00)
GFR, Estimated: >60 mL/min (ref >60)
Glucose, Bld: 114 mg/dL — ABNORMAL HIGH (ref 70–99)
Potassium: 3.4 mmol/L — ABNORMAL LOW (ref 3.5–5.1)
Sodium: 136 mmol/L (ref 135–145)
Total Bilirubin: 0.6 mg/dL (ref 0.3–1.2)
Total Protein: 7.2 g/dL (ref 6.5–8.1)

## 2022-12-01 LAB — CBC WITH DIFFERENTIAL/PLATELET
Abs Immature Granulocytes: 0.04 10*3/uL (ref 0.00–0.07)
Basophils Absolute: 0.1 10*3/uL (ref 0.0–0.1)
Basophils Relative: 0 %
Eosinophils Absolute: 0.2 10*3/uL (ref 0.0–0.5)
Eosinophils Relative: 2 %
HCT: 40 % (ref 36.0–46.0)
Hemoglobin: 13.5 g/dL (ref 12.0–15.0)
Immature Granulocytes: 0 %
Lymphocytes Relative: 33 %
Lymphs Abs: 4 10*3/uL (ref 0.7–4.0)
MCH: 29.9 pg (ref 26.0–34.0)
MCHC: 33.8 g/dL (ref 30.0–36.0)
MCV: 88.5 fL (ref 80.0–100.0)
Monocytes Absolute: 0.6 10*3/uL (ref 0.1–1.0)
Monocytes Relative: 5 %
Neutro Abs: 7.1 10*3/uL (ref 1.7–7.7)
Neutrophils Relative %: 60 %
Platelets: 210 10*3/uL (ref 150–400)
RBC: 4.52 MIL/uL (ref 3.87–5.11)
RDW: 13.9 % (ref 11.5–15.5)
WBC: 12 10*3/uL — ABNORMAL HIGH (ref 4.0–10.5)
nRBC: 0 % (ref 0.0–0.2)

## 2022-12-01 LAB — PROTIME-INR
INR: 1 (ref 0.8–1.2)
Prothrombin Time: 13.2 s (ref 11.4–15.2)

## 2022-12-01 MED ORDER — SODIUM CHLORIDE 0.9% FLUSH: 3.0000 mL | Freq: Once | INTRAVENOUS | Status: DC

## 2022-12-01 NOTE — ED Notes (Signed)
ED provider at bedside.

## 2022-12-01 NOTE — Discharge Instructions (Addendum)
You were seen in the emergency room today for evaluation of tingling over the left side of your body.  Fortunately workup including labs, CT, and MRI were reassuring.  Follow-up with your primary care doctor for further evaluation.  Return to the ER for new or worsening symptoms.

## 2022-12-01 NOTE — ED Triage Notes (Signed)
Patient to ED via POV for tingling on left side of body that started yesterday at 2pm. Denies weakness or speech difficulties. PT able to feel both sided the same but states that the left just feels like it is tingling. States she did receive a cortisone shot in her left shoulder on Friday.

## 2022-12-01 NOTE — ED Provider Notes (Signed)
[facility] Provider Note    Event Date/Time   First MD Initiated Contact with Patient 12/01/22 1454     (approximate)   History   Tingling   HPI  Nicole Cervantes is a 47 year old female with history of hypertension, diabetes, simple radiculitis presenting to the emergency department for evaluation of tingling.  Yesterday around 2 PM, patient had onset of tingling over the left side of her body.  Specifically notes tingling over the lower part of her left face, left arm most prominently in 2 of her digits, and in her left toes.  Denies history of similar.  No injury.  Did recently have a cortisone shot for suspected cervical radiculitis and reports she has had some ongoing soreness in her upper arm related to this, but the symptoms are new.  Spoke with the doctor who did her cortisone shot and they did not feel her symptoms were associated with her shot and recommended ER presentation.  No vision changes, difficulty with speech, focal weakness.  Feels like her symptoms are improved but not completely resolved currently.    Physical Exam   Triage Vital Signs: ED Triage Vitals  Encounter Vitals Group     BP 12/01/22 1254 (!) 152/80     Systolic BP Percentile --      Diastolic BP Percentile --      Pulse Rate 12/01/22 1254 (!) 56     Resp 12/01/22 1254 18     Temp 12/01/22 1254 98.2 F (36.8 C)     Temp Source 12/01/22 1254 Oral     SpO2 12/01/22 1254 100 %     Weight 12/01/22 1255 255 lb (115.7 kg)     Height 12/01/22 1255 5\' 3"  (1.6 m)     Head Circumference --      Peak Flow --      Pain Score 12/01/22 1254 5     Pain Loc --      Pain Education --      Exclude from Growth Chart --     Most recent vital signs: Vitals:   12/01/22 1800 12/01/22 1830  BP: 120/64 130/68  Pulse: (!) 52 (!) 59  Resp: 18 18  Temp:    SpO2: 98% 99%     General: Awake, interactive  CV:  Bradycardic, good peripheral perfusion.  Resp:  Unlabored respirations.   Abd:  Nondistended.  Neuro:  Keenly aware, correctly answers month and age, able to blink eyes and squeeze hands, normal horizontal extraocular movements, no visual field loss, normal facial symmetry, no arm or leg motor drift, no limb ataxia, intact sensation in the bilateral upper and lower extremities and over the face but patient reports that difference in sensation to light touch over her lower face, fingers. No aphasia, no dysarthria, no inattention   ED Results / Procedures / Treatments   Labs (all labs ordered are listed, but only abnormal results are displayed) Labs Reviewed  COMPREHENSIVE METABOLIC PANEL - Abnormal; Notable for the following components:      Result Value   Potassium 3.4 (*)    Glucose, Bld 114 (*)    All other components within normal limits  CBC WITH DIFFERENTIAL/PLATELET - Abnormal; Notable for the following components:   WBC 12.0 (*)    All other components within normal limits  PROTIME-INR  APTT  ETHANOL  CBG MONITORING, ED  I-STAT CREATININE, ED     EKG EKG independently reviewed interpreted by myself (ER attending) demonstrates:  EKG demonstrates sinus bradycardia at a rate of 58, PR 172, QRS 96, QTc 433, no acute ST changes  RADIOLOGY Imaging independently reviewed and interpreted by myself demonstrates:  CT head without acute bleed on my review MRI brain without evidence of acute CVA or other acute findings  PROCEDURES:  Critical Care performed: No  Procedures   MEDICATIONS ORDERED IN ED: Medications  sodium chloride flush (NS) 0.9 % injection 3 mL (3 mLs Intravenous Not Given 12/01/22 1510)     IMPRESSION / MDM / ASSESSMENT AND PLAN / ED COURSE  I reviewed the triage vital signs and the nursing notes.  Differential diagnosis includes, but is not limited to, acute CVA, TIA, intracranial bleed, electrolyte abnormality, adverse effect from recent procedure, peripheral nerve injury, stress mediated physiologic response  Patient's  presentation is most consistent with acute presentation with potential threat to life or bodily function.  47 year old female presenting to the emergency department for evaluation of left-sided tingling.  Vital signs stable on presentation.  Labs with mild leukocytosis, overall without critical derangements.  Head CT obtained from triage without obvious bleed.  NIH of 1 for sensation changes, without evidence of large vessel occlusion, greater than 24 hours from onset of symptoms, no indication for code stroke activation.  Somewhat atypical presentation, but with ongoing symptoms will obtain MRI to rule out stroke.  MRI reassuring.  Patient reevaluated.  Tingling has resolved.  She is comfortable discharge home and outpatient follow-up.  Strict return precautions provided.  Patient discharged in stable condition.    FINAL CLINICAL IMPRESSION(S) / ED DIAGNOSES   Final diagnoses:  Paresthesia of left upper and lower extremity  Tingling of face     Rx / DC Orders   ED Discharge Orders     None        Note:  This document was prepared using Dragon voice recognition software and may include unintentional dictation errors.   Trinna Post, MD 12/01/22 [phone]

## 2022-12-01 NOTE — ED Notes (Signed)
MRI called for screening questions. °

## 2022-12-16 ENCOUNTER — Ambulatory Visit (INDEPENDENT_AMBULATORY_CARE_PROVIDER_SITE_OTHER): Payer: BC Managed Care – PPO | Admitting: Internal Medicine

## 2022-12-16 ENCOUNTER — Encounter (INDEPENDENT_AMBULATORY_CARE_PROVIDER_SITE_OTHER): Payer: Self-pay | Admitting: Internal Medicine

## 2022-12-16 VITALS — BP 109/78 | HR 78 | Temp 98.2°F | Ht 64.0 in | Wt 249.0 lb

## 2022-12-16 DIAGNOSIS — I10 Essential (primary) hypertension: Secondary | ICD-10-CM | POA: Diagnosis not present

## 2022-12-16 DIAGNOSIS — G4733 Obstructive sleep apnea (adult) (pediatric): Secondary | ICD-10-CM | POA: Diagnosis not present

## 2022-12-16 DIAGNOSIS — E119 Type 2 diabetes mellitus without complications: Secondary | ICD-10-CM | POA: Diagnosis not present

## 2022-12-16 DIAGNOSIS — E66813 Obesity, class 3: Secondary | ICD-10-CM

## 2022-12-16 DIAGNOSIS — Z7985 Long-term (current) use of injectable non-insulin antidiabetic drugs: Secondary | ICD-10-CM

## 2022-12-16 DIAGNOSIS — E782 Mixed hyperlipidemia: Secondary | ICD-10-CM | POA: Diagnosis not present

## 2022-12-16 DIAGNOSIS — Z6841 Body Mass Index (BMI) 40.0 and over, adult: Secondary | ICD-10-CM

## 2022-12-16 NOTE — Assessment & Plan Note (Signed)
Blood pressure is well-controlled.  On losartan and amlodipine.  Losing 10% of body weight may improve condition.  Continue current regimen

## 2022-12-16 NOTE — Progress Notes (Signed)
Office: [phone]  /  Fax: [phone]   Initial Visit  Nicole Cervantes was seen in clinic today to evaluate for obesity. She is interested in losing weight to improve overall health and reduce the risk of weight related complications. She presents today to review program treatment options, initial physical assessment, and evaluation.     She was referred by: PCP  When asked what else they would like to accomplish? She states: Improve energy levels and physical activity, Improve existing medical conditions, Reduce number of medications, and Improve quality of life  Weight history: Since young, covid, pregnancy, changes in job to sedentary  When asked how has your weight affected you? She states: Contributed to medical problems, Contributed to orthopedic problems or mobility issues, Having fatigue, Having poor endurance, and Other: body contour  Some associated conditions: Hypertension, OSA, and Diabetes  Contributing factors: Family history of obesity, Disruption of circadian rhythm / sleep disordered breathing, Consumption of highly palatable foods, Reduced physical activity, and Eating patterns  Weight promoting medications identified: None  Current nutrition plan: None  Current level of physical activity: None and Limited due to chronic pain or orthopedic problems  Current or previous pharmacotherapy: GLP-1 was on ozempic and monjauro, stopped due to side effects.   Response to medication: Had side effects so it was discontinued   Past medical history includes:   Past Medical History:  Diagnosis Date   Allergy    Anxiety    Back pain    Depression    Diabetes mellitus without complication (HCC)    Hypertension    DR. GRANDIS - [facility]   Migraines      Objective:   BP 109/78   Pulse 78   Temp 98.2 F (36.8 C)   Ht 5\' 4"  (1.626 m)   Wt 249 lb (112.9 kg)   SpO2 96%   BMI 42.74 kg/m  She was weighed on the bioimpedance scale: Body mass index is  42.74 kg/m.  Peak Weight:255 , Body Fat%:49, Visceral Fat Rating:15, Weight trend over the last 12 months: Unchanged  General:  Alert, oriented and cooperative. Patient is in no acute distress.  Respiratory: Normal respiratory effort, no problems with respiration noted   Gait: able to ambulate independently  Mental Status: Normal mood and affect. Normal behavior. Normal judgment and thought content.   DIAGNOSTIC DATA REVIEWED:  BMET    Component Value Date/Time   NA 136 12/01/2022 1257   NA 136 06/08/2012 1708   K 3.4 (L) 12/01/2022 1257   K 2.9 (L) 06/08/2012 1708   CL 101 12/01/2022 1257   CL 102 06/08/2012 1708   CO2 24 12/01/2022 1257   CO2 25 06/08/2012 1708   GLUCOSE 114 (H) 12/01/2022 1257   GLUCOSE 109 (H) 06/08/2012 1708   BUN 15 12/01/2022 1257   BUN 12 06/08/2012 1708   CREATININE 0.64 12/01/2022 1257   CREATININE 0.73 06/08/2012 1708   CALCIUM 8.9 12/01/2022 1257   CALCIUM 9.2 06/08/2012 1708   GFRNONAA >60 12/01/2022 1257   GFRNONAA >60 06/08/2012 1708   GFRAA >60 06/17/2016 0712   GFRAA >60 06/08/2012 1708   Lab Results  Component Value Date   HGBA1C 6.1 05/23/2022   HGBA1C 5.7 (H) 10/25/2015   No results found for: "INSULIN" CBC    Component Value Date/Time   WBC 12.0 (H) 12/01/2022 1257   RBC 4.52 12/01/2022 1257   HGB 13.5 12/01/2022 1257   HGB 13.5 06/08/2012 1708   HCT 40.0  12/01/2022 1257   HCT 38.1 06/08/2012 1708   PLT 210 12/01/2022 1257   PLT 288 06/08/2012 1708   MCV 88.5 12/01/2022 1257   MCV 91 06/08/2012 1708   MCH 29.9 12/01/2022 1257   MCHC 33.8 12/01/2022 1257   RDW 13.9 12/01/2022 1257   RDW 13.6 06/08/2012 1708   Iron/TIBC/Ferritin/ %Sat No results found for: "IRON", "TIBC", "FERRITIN", "IRONPCTSAT" Lipid Panel     Component Value Date/Time   CHOL 130 05/23/2022 0803   TRIG 106.0 05/23/2022 0803   HDL 45.50 05/23/2022 0803   CHOLHDL 3 05/23/2022 0803   VLDL 21.2 05/23/2022 0803   LDLCALC 63 05/23/2022 0803    Hepatic Function Panel     Component Value Date/Time   PROT 7.2 12/01/2022 1257   PROT 7.9 06/08/2012 1708   ALBUMIN 3.8 12/01/2022 1257   ALBUMIN 3.8 06/08/2012 1708   AST 27 12/01/2022 1257   AST 51 (H) 06/08/2012 1708   ALT 29 12/01/2022 1257   ALT 49 06/08/2012 1708   ALKPHOS 79 12/01/2022 1257   ALKPHOS 75 06/08/2012 1708   BILITOT 0.6 12/01/2022 1257   BILITOT 0.3 06/08/2012 1708      Component Value Date/Time   TSH 2.19 11/08/2021 0746     Assessment and Plan:   Primary hypertension Assessment & Plan: Blood pressure is well-controlled.  On losartan and amlodipine.  Losing 10% of body weight may improve condition.  Continue current regimen   OSA (obstructive sleep apnea) Assessment & Plan: She reports is mild has not been able to tolerate CPAP therapy because of neck pain.  I provided her with a handout containing symptoms of sleep apnea as well as its effects on weight loss.   Diabetes mellitus without complication (HCC) Assessment & Plan: Her most recent hemoglobin A1c was 6.1 which suggest adequate glycemic control.  She had tried to [facility] and Ozempic in the past but this was met with medication side effects including diffuse rash and chest pain respectively.  She is now on metformin and is tolerating medication well without any adverse effects.  She will benefit from a low-carb high-protein reduced calorie meal plan.  She will receive additional nutritional counseling at her next visit.   Mixed hyperlipidemia Assessment & Plan: LDL is at goal. Elevated LDL may be secondary to nutrition, genetics and spillover effect from excess adiposity. Recommended LDL goal is <70 to reduce the risk of fatty streaks and the progression to obstructive ASCVD in the future.   She is on atorvastatin 10 mg a day  Her 10 year risk is: The 10-year ASCVD risk score (Arnett DK, et al., 2019) is: 1.5%  Lab Results  Component Value Date   CHOL 130 05/23/2022   HDL 45.50  05/23/2022   LDLCALC 63 05/23/2022   TRIG 106.0 05/23/2022   CHOLHDL 3 05/23/2022    losing 10% or more of body weight may improve condition.  Continue statin therapy     Class 3 severe obesity with serious comorbidity and body mass index (BMI) of 40.0 to 44.9 in adult, unspecified obesity type [facility]) Assessment & Plan: We reviewed anthropometrics, biometrics, associated medical conditions and contributing factors with patient. she would benefit from a medically tailored reduced calorie nutrional plan based on her REE (resting energy expenditure), which will be determined by indirect calorimetry.  We will also assess for cardiometabolic risk and nutritional derangements via fasting labs at intake appointment.  Obesity Treatment / Action Plan:  Patient will work on garnering support from family and friends to begin weight loss journey. Will work on eliminating or reducing the presence of highly palatable, calorie dense foods in the home. Will complete provided nutritional and psychosocial assessment questionnaire before the next appointment. Will be scheduled for indirect calorimetry to determine resting energy expenditure in a fasting state.  This will allow [place] to create a reduced calorie, high-protein meal plan to promote loss of fat mass while preserving muscle mass. Counseled on the health benefits of losing 5%-15% of total body weight. Was counseled on nutritional approaches to weight loss and benefits of reducing processed foods and consuming plant-based foods and high quality protein as part of nutritional weight management. Was counseled on pharmacotherapy and role as an adjunct in weight management.   Obesity Education Performed Today:  She was weighed on the bioimpedance scale and results were discussed and documented in the synopsis.  We discussed obesity as a disease and the importance of a more detailed evaluation of all the factors contributing  to the disease.  We discussed the importance of long term lifestyle changes which include nutrition, exercise and behavioral modifications as well as the importance of customizing this to her specific health and social needs.  We discussed the benefits of reaching a healthier weight to alleviate the symptoms of existing conditions and reduce the risks of the biomechanical, metabolic and psychological effects of obesity.  Earlena Judie Petit Buckles appears to be in the action stage of change and states they are ready to start intensive lifestyle modifications and behavioral modifications.  30 minutes was spent today on this visit including the above counseling, pre-visit chart review, and post-visit documentation.  Reviewed by clinician on day of visit: allergies, medications, problem list, medical history, surgical history, family history, social history, and previous encounter notes pertinent to obesity diagnosis.   Worthy Rancher, MD

## 2022-12-16 NOTE — Assessment & Plan Note (Signed)
LDL is at goal. Elevated LDL may be secondary to nutrition, genetics and spillover effect from excess adiposity. Recommended LDL goal is <70 to reduce the risk of fatty streaks and the progression to obstructive ASCVD in the future.   She is on atorvastatin 10 mg a day  Her 10 year risk is: The 10-year ASCVD risk score (Arnett DK, et al., 2019) is: 1.5%  Lab Results  Component Value Date   CHOL 130 05/23/2022   HDL 45.50 05/23/2022   LDLCALC 63 05/23/2022   TRIG 106.0 05/23/2022   CHOLHDL 3 05/23/2022    losing 10% or more of body weight may improve condition.  Continue statin therapy

## 2022-12-16 NOTE — Assessment & Plan Note (Signed)
We reviewed anthropometrics, biometrics, associated medical conditions and contributing factors with patient. she would benefit from a medically tailored reduced calorie nutrional plan based on her REE (resting energy expenditure), which will be determined by indirect calorimetry.  We will also assess for cardiometabolic risk and nutritional derangements via fasting labs at intake appointment.

## 2022-12-16 NOTE — Assessment & Plan Note (Signed)
Her most recent hemoglobin A1c was 6.1 which suggest adequate glycemic control.  She had tried to [facility] and Ozempic in the past but this was met with medication side effects including diffuse rash and chest pain respectively.  She is now on metformin and is tolerating medication well without any adverse effects.  She will benefit from a low-carb high-protein reduced calorie meal plan.  She will receive additional nutritional counseling at her next visit.

## 2022-12-16 NOTE — Assessment & Plan Note (Signed)
She reports is mild has not been able to tolerate CPAP therapy because of neck pain.  I provided her with a handout containing symptoms of sleep apnea as well as its effects on weight loss.

## 2022-12-17 ENCOUNTER — Ambulatory Visit: Payer: BC Managed Care – PPO | Admitting: Clinical

## 2022-12-17 DIAGNOSIS — F3341 Major depressive disorder, recurrent, in partial remission: Secondary | ICD-10-CM | POA: Diagnosis not present

## 2022-12-17 NOTE — Progress Notes (Signed)
                Dezi Schaner, LCSW 

## 2022-12-17 NOTE — Progress Notes (Signed)
[facility] Counselor/Therapist Progress Note  Patient ID: Nicole Cervantes, MRN: [id number],    Date: 12/17/2022  Time Spent: 12:36pm - 1:16pm : 40 minutes   Treatment Type: Individual Therapy  Reported Symptoms: Patient reported neck pain  Mental Status Exam: Appearance:  Neat and Well Groomed     Behavior: Appropriate  Motor: Normal  Speech/Language:  Clear and Coherent  Affect: Appropriate  Mood: normal  Thought process: normal  Thought content:   WNL  Sensory/Perceptual disturbances:   Patient reported experiencing neck pain  Orientation: oriented to person, place, and situation  Attention: Good  Concentration: Good  Memory: WNL  Fund of knowledge:  Good  Insight:   Good  Judgment:  Good  Impulse Control: Good   Risk Assessment: Danger to Self:  No Patient denied current suicidal ideation  Self-injurious Behavior: No Danger to Others: No Patient denied current homicidal ideation Duty to Warn:no Physical Aggression / Violence:No  Access to Firearms a concern: No  Gang Involvement:No   Subjective: Patient stated, "work is ok, a lot of people are leaving". Patient reported she has been in "constant pain 24/7" due to a pinched nerve. Patient reported she received a cortisone injection and stated, "it had no effect at all". Patient reported she has an upcoming appointment with a neurosurgeon. Patient stated, "I've been missing a lot of work", "I'm afraid with the new people that it won't be as forgiving". Patent reported patient's employer obtained a stand up desk for patient. Patient stated, "its been kind of rough", "I'm kind of scared" and reported she is scared due to recent home purchase, husband being on disability, and fear of surgery. Patient stated, "I'm very scared because the pain is getting worse, its going down my arm". Patient reported "excruciating" pain with various movements. 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 Patient reported patient/husband are planning a trip to celebrate the holidays with family. Patient stated, "just tired", "I'm not depressed" in response to patient's mood. Patient stated, "I've not been keeping up with my journaling". Patient stated, "it (pain) is definitely impacting everything". Patient reported neck pain is impacting patient's ability to go to the grocery store, complete household tasks, attend social events. Patient reported her family is being supportive. Patient stated, "I'm doing pretty good at not getting angry". Patient reported she has been utilizing meditation. Patient stated, "I'm going to try that" in response to imagery/visualization. Patient stated, "breathing has helped me coping with a lot of things".   Interventions: Cognitive Behavioral Therapy and supportive therapy . Clinician conducted session in person from clinician's office at [facility]. Reviewed events since last session and discussed status of recent stressors. Provided supportive therapy, active listening as patient discussed current health concerns and the impact on patient's physical health, mental health, and daily functioning. Assisted patient in exploring thoughts/feelings triggered by current health concerns. Assessed patient's mood. Reviewed patient's homework and barriers to completing homework assignment. Provided psycho education related to imagery/visualization. Reviewed deep breathing exercises and provided psycho education related to deep breathing. Provided psycho education related to progressive muscle relaxation. Clinician requested for homework patient continue self esteem journal/gratitude journal.   Collaboration of Care: Other is not required at this time.    Diagnosis: Major Depressive Disorder, recurrent, in partial remission   Plan: Patient is to utilize [employer], thought re-framing, relaxation techniques, effective communication  strategies, dialectical behavior therapy skills, and coping strategies to  decrease symptoms associated with Major Depressive Disorder. Frequency: monthly  Modality: individual      Long-term goal:   Patient stated, "to be happy again" and "be a positive person".    Reduce overall level, frequency, and intensity of the feelings of depression as evidenced by decreased irritability, easily angered, increased appetite, difficulty getting out of bed due to fatigue, going to bed and staying in bed after dinner, decrease in hygiene, sleeping late in the mornings, waking up in the middle of the night multiple times, lack of energy, change in concentration, difficulty recalling information at work, taking longer to complete tasks, "melancholy" mood, loss of interest, feelings of guilt, apologizing frequently, and symptoms of anxiety from 7 days per week to 0 to 1 days per week per patient report for at least 1 month.   Target Date: 01/17/23  Progress: progressing    Short-term goal:  Verbalize an understanding of the relationship between depressive symptoms and patient's communication with husband per patient's report.   Target Date: 06/17/22  Progress: Patient reported she feels this goal has been met.     Develop effective communication strategies for patient to utilize when expressing her thoughts and feelings to others in a controlled and assertive way  Target Date: 01/17/23  Progress: progressing    Verbally express an understanding of the relationship between feelings of depression, anxiety and the impact on thought patterns and behaviors.   Target Date: 01/17/23  Progress: progressing    Identify triggers for anger/irritability and develop coping strategies to utilize in response to feelings of anger/irritability to reduce anger outbursts per patient's report  Target Date: 06/17/22  Progress: patient reported she feels this goal has been met    Identify, challenge, and replace negative  thought patterns and negative self talk that contribute to feelings of depression and anxiety with positive thoughts, beliefs, and positive self talk per patient's report   Target Date: 01/17/23  Progress: progressing    Develop and implement emotion regulation skills to decrease fluctuations in mood, such as, anger, irritability, anxiety, and depression  Target Date: 06/17/22  Progress: Patient reported she feels this goal has been met.        Doree Barthel, LCSW

## 2022-12-18 ENCOUNTER — Other Ambulatory Visit: Payer: Self-pay | Admitting: Family

## 2022-12-18 DIAGNOSIS — I10 Essential (primary) hypertension: Secondary | ICD-10-CM

## 2022-12-18 DIAGNOSIS — F411 Generalized anxiety disorder: Secondary | ICD-10-CM

## 2022-12-19 ENCOUNTER — Inpatient Hospital Stay
Admission: RE | Admit: 2022-12-19 | Discharge: 2022-12-19 | Disposition: A | Discharge status: Home / Self Care | Payer: Self-pay | Source: Ambulatory Visit | Attending: Neurosurgery | Admitting: Neurosurgery

## 2022-12-19 ENCOUNTER — Other Ambulatory Visit: Payer: Self-pay

## 2022-12-19 DIAGNOSIS — Z049 Encounter for examination and observation for unspecified reason: Secondary | ICD-10-CM

## 2022-12-22 NOTE — Progress Notes (Unsigned)
Referring Physician:  Denton Lank, FNP [id number] [place],  [place] [address]  Primary Physician:  Mort Sawyers, FNP  History of Present Illness: 12/25/2022 [name] is here today with a chief complaint of neck pain with extension to the left trapezius ridge and shoulder and into the left arm that stops at the elbow. She also has pain in the left hand into the thumb and pointer finger.  She has been having severe pain since October 2024.  It is gotten slightly better over the past couple of weeks.  Sitting and laying down make it worse.  Standing and walking improves her pain.  Her pain goes into her shoulder blade and down her left arm.  She previously had an episode of this in 2018, but it subsequently improved.  She denies any overt symptoms of myelopathy.   Bowel/Bladder Dysfunction: none  Conservative measures:  Physical therapy: has participated in before for her neck in 2018, none since then.  Multimodal medical therapy including regular antiinflammatories: Prednisone, flexeril, Hydrocodone,   Injections: 11/28/2022: Left C4-5 transforaminal ESI(no relief) 09/12/2016: Right L5-S1 transforaminal ESI (good relief) 06/06/2016: Right C4-5 transforaminal ESI (good relief)    Past Surgery: no prior spinal surgeries   Nicole Cervantes has no symptoms of cervical myelopathy.  The symptoms are causing a significant impact on the patient's life.   I have utilized the care everywhere function in epic to review the outside records available from external health systems.  Review of Systems:  A 10 point review of systems is negative, except for the pertinent positives and negatives detailed in the HPI.  Past Medical History: Past Medical History:  Diagnosis Date   Allergy    Anxiety    Back pain    Depression    Diabetes mellitus without complication (HCC)    Hypertension    [facility] GR[facility] - [facility]   Migraines     Past Surgical History: Past  Surgical History:  Procedure Laterality Date   ABDOMINAL HYSTERECTOMY  05/2007   IN [place], [place] - TOTAL EXCEPT OVARIES, LEIOMYOMA   COLONOSCOPY  2006   DX   Cyst removal to lower back     LAPAROSCOPY  2000   BURNED HOLES IN OVARIES TO RELEASE PRESSURE FROM CYSTS.  DR. BURNS, [place]   OVARIAN CYST REMOVAL     SKIN BIOPSY     TONSILLECTOMY      Allergies: Allergies as of 12/25/2022 - Review Complete 12/25/2022  Allergen Reaction Noted   Ozempic (0.25 or 0.5 mg-dose) [semaglutide(0.25 or 0.5mg -dos)] Other (See Comments) 09/09/2022   Mounjaro [tirzepatide] Rash 10/16/2022    Medications:  Current Outpatient Medications:    ACCU-CHEK GUIDE test strip, USE TO CHECK BLOOD SUGAR UP TO 4 TIMES DAILY AS DIRECTED, Disp: , Rfl:    Accu-Chek Softclix Lancets lancets, SMARTSIG:Topical 1-4 Times Daily, Disp: , Rfl:    amLODipine (NORVASC) 10 MG tablet, Take 1 tablet by mouth once daily, Disp: 90 tablet, Rfl: 1   atorvastatin (LIPITOR) 10 MG tablet, Take 1 tablet (10 mg total) by mouth daily., Disp: 90 tablet, Rfl: 3   blood glucose meter kit and supplies, Dispense based on patient and insurance preference. Use up to four times daily as directed. (FOR ICD-10 E10.9, E11.9)., Disp: 1 each, Rfl: 0   buPROPion (WELLBUTRIN) 75 MG tablet, Take 1 tablet (75 mg total) by mouth in the morning., Disp: 90 tablet, Rfl: 1   ciprofloxacin-dexamethasone (CIPRODEX) OTIC suspension, Place  4 drops into the right ear 2 (two) times daily., Disp: 7.5 mL, Rfl: 0   clobetasol cream (TEMOVATE) 0.05 %, Apply 1 Application topically 2 (two) times daily., Disp: 30 g, Rfl: 11   clotrimazole-betamethasone (LOTRISONE) cream, Apply externally BID prn sx up to 2 wks, Disp: 15 g, Rfl: 1   famotidine (PEPCID) 20 MG tablet, Take 1 tablet (20 mg total) by mouth daily as needed., Disp: , Rfl:    gabapentin (NEURONTIN) 100 MG capsule, Take 100 mg by mouth 2 (two) times daily., Disp: , Rfl:    HYDROcodone-acetaminophen (NORCO/VICODIN)  5-325 MG tablet, Take 1 tablet by mouth every 6 (six) hours as needed., Disp: , Rfl:    loratadine (CLARITIN) 10 MG tablet, Take 1 tablet by mouth daily., Disp: , Rfl:    losartan (COZAAR) 50 MG tablet, Take 1 tablet by mouth once daily, Disp: 90 tablet, Rfl: 3   metFORMIN (GLUCOPHAGE) 500 MG tablet, TAKE 1 TABLET BY MOUTH TWICE DAILY WITH A MEAL, Disp: 180 tablet, Rfl: 0   rOPINIRole (REQUIP) 0.25 MG tablet, Take 1 tablet (0.25 mg total) by mouth at bedtime., Disp: 90 tablet, Rfl: 3   sertraline (ZOLOFT) 100 MG tablet, Take 2 tablets by mouth once daily, Disp: 180 tablet, Rfl: 1   SUMAtriptan (IMITREX) 100 MG tablet, Take by mouth., Disp: , Rfl:    triamcinolone cream (KENALOG) 0.1 %, Apply 1 Application topically 2 (two) times daily as needed., Disp: , Rfl:   Social History: Social History   Tobacco Use   Smoking status: Former    Current packs/day: 0.00    Types: Cigarettes    Start date: 1989    Quit date: 2009    Years since quitting: 15.8   Smokeless tobacco: Never  Vaping Use   Vaping status: Never Used  Substance Use Topics   Alcohol use: Yes    Comment: occasionally once a month   Drug use: No    Family Medical History: Family History  Problem Relation Age of Onset   Diabetes Mother        passed away 06-09-21   Hypertension Mother    Depression Mother    Stroke Mother    Throat cancer Mother    Alcohol abuse Father    Prostate cancer Father 29   Diabetes Father    Hypertension Father    Bipolar disorder Sister    Bladder Cancer Sister 30       in remission   Lupus Sister    Bipolar disorder Sister    Depression Maternal Aunt    Depression Maternal Uncle    Alcohol abuse Maternal Grandfather    Depression Maternal Grandfather    Alcohol abuse Maternal Grandmother    Heart disease Maternal Grandmother    Depression Maternal Grandmother    Bipolar disorder Other     Physical Examination: Vitals:   12/25/22 0834  BP: 118/78    General: Patient is in  no apparent distress. Attention to examination is appropriate.  Neck:   Supple.  Full range of motion.  Respiratory: Patient is breathing without any difficulty.   NEUROLOGICAL:     Awake, alert, oriented to person, place, and time.  Speech is clear and fluent.   Cranial Nerves: Pupils equal round and reactive to light.  Facial tone is symmetric.  Facial sensation is symmetric. Shoulder shrug is symmetric. Tongue protrusion is midline.  There is no pronator drift.  Strength: Side Biceps Triceps Deltoid Interossei Grip Wrist Ext. Wrist  Flex.  R 5 5 5 5 5 5 5   L 5 5 5 5 5 5 5    Side Iliopsoas Quads Hamstring PF DF EHL  R 5 5 5 5 5 5   L 5 5 5 5 5 5    Reflexes are 3+ and symmetric at the biceps, triceps, brachioradialis, patella and achilles.   Hoffman's is present on the right.   Bilateral upper and lower extremity sensation is intact to light touch.    No evidence of dysmetria noted.  Gait is slightly wide-based.  She has moderate difficulty with tandem gait..     Medical Decision Making  Imaging: MRI C spine 11/22/2022 Disc levels:   C2-3: Uncovertebral spurring asymmetric to the left with mild foraminal narrowing by gradient images   C3-4: Mild disc bulging and facet spurring.  No neural impingement.   C4-5: Disc narrowing and bulging with uncovertebral ridging. Disc osteophyte complex indents the ventral cord on a chronic basis and there is biforaminal impingement.   C5-6: Disc narrowing and bulging with uncovertebral spurring. Biforaminal impingement.   C6-7: Disc narrowing and degenerative fatty marrow conversion. Mild disc bulging. Mild left foraminal narrowing from uncovertebral spur primarily.   C7-T1:Negative.   IMPRESSION: 1. Mild progression of degeneration since 2018. 2. Disc degeneration especially at C4-5 to C6-7. 3. Biforaminal impingement at C4-5 and C5-6. Spinal canal narrowing at both of these levels with ventral cord indentation at  C4-5, central stenosis stable from 2018.     Electronically Signed   By: Tiburcio Pea M.D.   On: 11/22/2022 09:51  MR Brain 12/01/2022 IMPRESSION: No acute intracranial process. No evidence of acute or subacute infarct.     Electronically Signed   By: Wiliam Ke M.D.   On: 12/01/2022 18:39  I have personally reviewed the images and agree with the above interpretation.  Assessment and Plan: Ms. Altheide is a pleasant 47 y.o. female with cervical stenosis causing cervical myelopathy and cervical radiculopathy.  Her radicular symptoms are more prominent than her myelopathic symptoms.  Her radicular pain has improved over the past couple of weeks.  Because her myelopathic symptoms are very mild, we will we will try a short course of physical therapy to see if her symptoms will improve.  I will see her back in approximately 8 weeks or sooner if she fails physical therapy.  If she improves, we will hold off on intervention for now.  If she does not have improvement in her symptoms on follow-up, we will consider C4-6 anterior cervical discectomy and fusion.  I spent a total of 30 minutes in this patient's care today. This time was spent reviewing pertinent records including imaging studies, obtaining and confirming history, performing a directed evaluation, formulating and discussing my recommendations, and documenting the visit within the medical record.    Thank you for involving me in the care of this patient.      Evelene Roussin K. Myer Haff MD, [facility] Neurosurgery

## 2022-12-25 ENCOUNTER — Ambulatory Visit: Payer: BC Managed Care – PPO | Admitting: Neurosurgery

## 2022-12-25 ENCOUNTER — Encounter: Payer: Self-pay | Admitting: Neurosurgery

## 2022-12-25 VITALS — BP 118/78 | Ht 64.0 in | Wt 250.0 lb

## 2022-12-25 DIAGNOSIS — M4802 Spinal stenosis, cervical region: Secondary | ICD-10-CM | POA: Diagnosis not present

## 2022-12-25 DIAGNOSIS — M5412 Radiculopathy, cervical region: Secondary | ICD-10-CM

## 2022-12-25 DIAGNOSIS — G959 Disease of spinal cord, unspecified: Secondary | ICD-10-CM | POA: Diagnosis not present

## 2023-01-06 DIAGNOSIS — M542 Cervicalgia: Secondary | ICD-10-CM | POA: Diagnosis not present

## 2023-01-06 DIAGNOSIS — M79602 Pain in left arm: Secondary | ICD-10-CM | POA: Diagnosis not present

## 2023-01-13 DIAGNOSIS — M542 Cervicalgia: Secondary | ICD-10-CM | POA: Diagnosis not present

## 2023-01-13 DIAGNOSIS — M79602 Pain in left arm: Secondary | ICD-10-CM | POA: Diagnosis not present

## 2023-01-15 ENCOUNTER — Ambulatory Visit: Payer: BC Managed Care – PPO | Admitting: Clinical

## 2023-01-26 ENCOUNTER — Other Ambulatory Visit: Payer: Self-pay | Admitting: Family

## 2023-01-26 DIAGNOSIS — E119 Type 2 diabetes mellitus without complications: Secondary | ICD-10-CM

## 2023-02-07 ENCOUNTER — Other Ambulatory Visit: Payer: Self-pay | Admitting: Family

## 2023-02-07 DIAGNOSIS — E782 Mixed hyperlipidemia: Secondary | ICD-10-CM

## 2023-02-08 DIAGNOSIS — G4733 Obstructive sleep apnea (adult) (pediatric): Secondary | ICD-10-CM | POA: Diagnosis not present

## 2023-02-09 ENCOUNTER — Encounter: Payer: Self-pay | Admitting: Psychiatry

## 2023-02-09 ENCOUNTER — Telehealth (INDEPENDENT_AMBULATORY_CARE_PROVIDER_SITE_OTHER): Payer: BC Managed Care – PPO | Admitting: Psychiatry

## 2023-02-09 DIAGNOSIS — F4010 Social phobia, unspecified: Secondary | ICD-10-CM | POA: Diagnosis not present

## 2023-02-09 DIAGNOSIS — F3342 Major depressive disorder, recurrent, in full remission: Secondary | ICD-10-CM | POA: Diagnosis not present

## 2023-02-09 DIAGNOSIS — F4000 Agoraphobia, unspecified: Secondary | ICD-10-CM

## 2023-02-09 DIAGNOSIS — G4701 Insomnia due to medical condition: Secondary | ICD-10-CM

## 2023-02-09 DIAGNOSIS — G4730 Sleep apnea, unspecified: Secondary | ICD-10-CM

## 2023-02-09 NOTE — Progress Notes (Signed)
 Virtual Visit via Video Note  I connected with Vonceil Upshur Holway on 02/09/23 at  8:30 AM EST by a video enabled telemedicine application and verified that I am speaking with the correct person using two identifiers.  Location Provider Location : [facility] Patient Location : Home  Participants: Patient , Provider   I discussed the limitations of evaluation and management by telemedicine and the availability of in person appointments. The patient expressed understanding and agreed to proceed.    I discussed the assessment and treatment plan with the patient. The patient was provided an opportunity to ask questions and all were answered. The patient agreed with the plan and demonstrated an understanding of the instructions.   The patient was advised to call back or seek an in-person evaluation if the symptoms worsen or if the condition fails to improve as anticipated.   BH MD OP Progress Note  02/09/2023 9:04 AM Shiri MASHAL SLAVICK  MRN:  [id number]  Chief Complaint:  Chief Complaint  Patient presents with   Follow-up   Anxiety   Depression   Medication Refill   HPI: JENEFER WOERNER is a 48 year old Caucasian female, married, employed, lives in [place], has a history of MDD, agoraphobia, anxiety disorder, bulimia, hypertension, diabetes mellitus was evaluated by telemedicine today.   The patient reports continued improvement in her mental health. She has been actively socializing with her husband and participating in a book club. She has not reported any changes to her medication regimen, which includes sertraline  and Wellbutrin  75mg  in the morning. She reports good sleep quality and no restless legs at night, thanks to the ropinirole  prescribed by her primary care physician.She is compliant with her CPAP machine.  The patient also reports occasional use of gabapentin for neck pain due to recently diagnosed spinal stenosis. She takes it approximately once a week, only at night due to its sedative  effects. She denies any suicidal thoughts, auditory hallucinations, or violent ideations. Her appetite is regular.   The patient's grief related to the loss of her mother appears to be well-managed. She recently visited her father and expressed satisfaction with how well her family is coping. She denies any changes to her medication regimen, except for the addition of gabapentin for spinal stenosis. She no longer takes hydrocodone , which was previously prescribed for severe pain.  Patient denies any suicidality, homicidality or perceptual disturbances.   Visit Diagnosis:    ICD-10-CM   1. Recurrent major depressive disorder, in full remission (HCC)  F33.42     2. Agoraphobia  F40.00     3. Social anxiety disorder  F40.10     4. Insomnia due to medical condition  G47.01    Sleep apnea on CPAP      Past Psychiatric History: I have reviewed past psychiatric history from progress note on 11/28/2021.  Past trials of medications like Paxil , Lamictal , Wellbutrin , Seroquel  Past Medical History:  Past Medical History:  Diagnosis Date   Allergy    Anxiety    Back pain    Depression    Diabetes mellitus without complication (HCC)    Hypertension    [facility] GR[facility]IS - [facility]   Migraines     Past Surgical History:  Procedure Laterality Date   ABDOMINAL HYSTERECTOMY  05/2007   IN [place], [place] - TOTAL EXCEPT OVARIES, LEIOMYOMA   COLONOSCOPY  2006   DX   Cyst removal to lower back     LAPAROSCOPY  2000   BURNED HOLES IN  OVARIES TO RELEASE PRESSURE FROM CYSTS.  [place], [place]   OVARIAN CYST REMOVAL     SKIN BIOPSY     TONSILLECTOMY      Family Psychiatric History: I have reviewed family psychiatric history from progress note on 11/28/2021.  Family History:  Family History  Problem Relation Age of Onset   Diabetes Mother        passed away 05-26-2021   Hypertension Mother    Depression Mother    Stroke Mother    Throat cancer Mother    Alcohol abuse Father    Prostate  cancer Father 51   Diabetes Father    Hypertension Father    Bipolar disorder Sister    Bladder Cancer Sister 56       in remission   Lupus Sister    Bipolar disorder Sister    Depression Maternal Aunt    Depression Maternal Uncle    Alcohol abuse Maternal Grandfather    Depression Maternal Grandfather    Alcohol abuse Maternal Grandmother    Heart disease Maternal Grandmother    Depression Maternal Grandmother    Bipolar disorder Other     Social History: I have reviewed social history from progress note on 11/28/2021. Social History   Socioeconomic History   Marital status: Married    Spouse name: Not on file   Number of children: 1   Years of education: 14   Highest education level: Associate degree: academic program  Occupational History    Employer: [employer]  Tobacco Use   Smoking status: Former    Current packs/day: 0.00    Types: Cigarettes    Start date: 1989    Quit date: 2009    Years since quitting: 16.0   Smokeless tobacco: Never  Vaping Use   Vaping status: Never Used  Substance and Sexual Activity   Alcohol use: Yes    Comment: occasionally once a month   Drug use: No   Sexual activity: Yes    Birth control/protection: Surgical    Comment: Hysterectomy   Other Topics Concern   Not on file  Social History Narrative   Not on file   Social Drivers of Health   Financial Resource Strain: Low Risk  (10/12/2022)   Overall Financial Resource Strain (CARDIA)    Difficulty of Paying Living Expenses: Not hard at all  Food Insecurity: No Food Insecurity (10/12/2022)   Hunger Vital Sign    Worried About Running Out of Food in the Last Year: Never true    Ran Out of Food in the Last Year: Never true  Transportation Needs: No Transportation Needs (10/12/2022)   PRAPARE - Administrator, Civil Service (Medical): No    Lack of Transportation (Non-Medical): No  Physical Activity: Unknown (10/12/2022)   Exercise Vital Sign    Days of Exercise  per Week: 0 days    Minutes of Exercise per Session: Not on file  Stress: No Stress Concern Present (10/12/2022)   [employer] of Occupational Health - Occupational Stress Questionnaire    Feeling of Stress : Only a little  Social Connections: Moderately Isolated (10/12/2022)   Social Connection and Isolation Panel [NHANES]    Frequency of Communication with Friends and Family: Three times a week    Frequency of Social Gatherings with Friends and Family: Once a week    Attends Religious Services: Never    Database Administrator or Organizations: No    Attends Banker Meetings:  Not on file    Marital Status: Married    Allergies:  Allergies  Allergen Reactions   Ozempic  (0.25 Or 0.5 Mg-Dose) [Semaglutide (0.25 Or 0.5mg -Dos)] Other (See Comments)    heartburn   Mounjaro  [Tirzepatide ] Rash    Metabolic Disorder Labs: Lab Results  Component Value Date   HGBA1C 6.1 05/23/2022   MPG 117 10/25/2015   No results found for: PROLACTIN Lab Results  Component Value Date   CHOL 130 05/23/2022   TRIG 106.0 05/23/2022   HDL 45.50 05/23/2022   CHOLHDL 3 05/23/2022   VLDL 21.2 05/23/2022   LDLCALC 63 05/23/2022   LDLCALC 133 (H) 02/18/2022   Lab Results  Component Value Date   TSH 2.19 11/08/2021   TSH 4.845 (H) 10/25/2015    Therapeutic Level Labs: No results found for: LITHIUM No results found for: VALPROATE No results found for: CBMZ  Current Medications: Current Outpatient Medications  Medication Sig Dispense Refill   ACCU-CHEK GUIDE test strip USE TO CHECK BLOOD SUGAR UP TO 4 TIMES DAILY AS DIRECTED     Accu-Chek Softclix Lancets lancets SMARTSIG:Topical 1-4 Times Daily     amLODipine  (NORVASC ) 10 MG tablet Take 1 tablet by mouth once daily 90 tablet 1   atorvastatin  (LIPITOR) 10 MG tablet Take 1 tablet by mouth once daily 90 tablet 0   blood glucose meter kit and supplies Dispense based on patient and insurance preference. Use up to four times  daily as directed. (FOR ICD-10 E10.9, E11.9). 1 each 0   buPROPion  (WELLBUTRIN ) 75 MG tablet Take 1 tablet (75 mg total) by mouth in the morning. 90 tablet 1   clobetasol  cream (TEMOVATE ) 0.05 % Apply 1 Application topically 2 (two) times daily. 30 g 11   clotrimazole -betamethasone  (LOTRISONE ) cream Apply externally BID prn sx up to 2 wks 15 g 1   famotidine  (PEPCID ) 20 MG tablet Take 1 tablet (20 mg total) by mouth daily as needed.     gabapentin (NEURONTIN) 100 MG capsule Take 100 mg by mouth 2 (two) times daily.     loratadine (CLARITIN) 10 MG tablet Take 1 tablet by mouth daily.     losartan  (COZAAR ) 50 MG tablet Take 1 tablet by mouth once daily 90 tablet 3   metFORMIN  (GLUCOPHAGE ) 500 MG tablet TAKE 1 TABLET BY MOUTH TWICE DAILY WITH A MEAL 180 tablet 0   rOPINIRole  (REQUIP ) 0.25 MG tablet Take 1 tablet (0.25 mg total) by mouth at bedtime. 90 tablet 3   sertraline  (ZOLOFT ) 100 MG tablet Take 2 tablets by mouth once daily 180 tablet 1   SUMAtriptan (IMITREX) 100 MG tablet Take by mouth.     triamcinolone  cream (KENALOG ) 0.1 % Apply 1 Application topically 2 (two) times daily as needed.     No current facility-administered medications for this visit.     Musculoskeletal: Strength & Muscle Tone:  UTA Gait & Station:  Seated Patient leans: N/A  Psychiatric Specialty Exam: Review of Systems  Psychiatric/Behavioral: Negative.      There were no vitals taken for this visit.There is no height or weight on file to calculate BMI.  General Appearance: Fairly Groomed  Eye Contact:  Fair  Speech:  Clear and Coherent  Volume:  Normal  Mood:  Euthymic  Affect:  Appropriate  Thought Process:  Goal Directed and Descriptions of Associations: Intact  Orientation:  Full (Time, Place, and Person)  Thought Content: Logical   Suicidal Thoughts:  No  Homicidal Thoughts:  No  Memory:  Immediate;  Fair Recent;   Fair Remote;   Fair  Judgement:  Fair  Insight:  Fair  Psychomotor Activity:   Normal  Concentration:  Concentration: Fair and Attention Span: Fair  Recall:  [employer] of Knowledge: Fair  Language: Fair  Akathisia:  No  Handed:  Right  AIMS (if indicated): not done  Assets:  Communication Skills Desire for Improvement Housing Social Support  ADL's:  Intact  Cognition: WNL  Sleep:  Fair   Screenings: AUDIT    Garment/textile Technologist Visit from 10/16/2022 in [facility] Admission (Discharged) from 10/24/2015 in [facility]  Alcohol Use Disorder Identification Test Final Score (AUDIT) 4  0      GAD-7    Flowsheet Row Office Visit from 10/16/2022 in [facility] Visit from 05/15/2022 in [facility] [place] Office Visit from 03/19/2022 in [facility] Office Visit from 11/28/2021 in [facility] Office Visit from 11/05/2021 in [facility]  Total GAD-7 Score 2 0 0 4 9      PHQ2-9    Flowsheet Row Video Visit from 02/09/2023 in [facility] Office Visit from 10/16/2022 in [facility] Video Visit from 10/07/2022 in [facility] Video Visit from 07/03/2022 in [facility] Office Visit from 05/23/2022 in [facility]  PHQ-2 Total Score 0 0 0 0 0  PHQ-9 Total Score -- 6 -- -- --      Flowsheet Row Video Visit from 02/09/2023 in [facility] ED from 12/01/2022 in [facility] Video Visit from 10/07/2022 in [facility]  C-SSRS RISK CATEGORY No Risk No Risk Low Risk        Assessment and Plan: KHAILA VELARDE is a 48 year old Caucasian  female, employed, married, lives in [place], has a history of depression, anxiety, multiple medical problem was evaluated by telemedicine today.  Patient is currently stable discussed assessment and plan as noted below.   Major Depression in Full Remission   Major depression is in full remission. No changes in symptoms or medications since the last visit. Reports doing well, engaging in social activities, and no suicidal ideation or thoughts of self-harm. Continues to work with therapist Darice Ronde.   - Continue current medications: Sertraline  100 mg daily and Wellbutrin  75 mg in the morning   - Follow up in 3 months    Agoraphobia and Social Anxiety-improving Reports improvement in symptoms. Engaging in social activities with her husband every weekend. No changes in medications or therapy.   - Continue current therapy with Darice Ronde   - Follow up in 3 months     Insomnia-improving Reports sleeping well at night. No changes in sleep patterns or medications.  Occasional symptoms managed with ropinirole  prescribed by primary care. No significant issues reported. - Continue current medications and CPAP use   - Continue current management with primary care     General Health Maintenance   No new health maintenance issues discussed. Compliant with CPAP and no new medications needed.   - No new health maintenance actions required    Follow-up   - Schedule in-office visit on April 7th at 10:30 AM.  Collaboration of Care:  Collaboration of Care: Referral or follow-up with counselor/therapist AEB patient encouraged to continue CBT  Patient/Guardian was advised Release of Information must be obtained prior to any record release in order to collaborate their care with an outside provider. Patient/Guardian was advised if they have not already done so to contact the registration department to sign all necessary forms in order for us  to release information regarding their care.   Consent:  Patient/Guardian gives verbal consent for treatment and assignment of benefits for services provided during this visit. Patient/Guardian expressed understanding and agreed to proceed.   This note was generated in part or whole with voice recognition software. Voice recognition is usually quite accurate but there are transcription errors that can and very often do occur. I apologize for any typographical errors that were not detected and corrected.    Ariyanah Aguado, MD 02/09/2023, 9:04 AM

## 2023-02-24 ENCOUNTER — Encounter: Payer: Self-pay | Admitting: Neurosurgery

## 2023-02-24 ENCOUNTER — Ambulatory Visit: Payer: BC Managed Care – PPO | Admitting: Neurosurgery

## 2023-02-24 ENCOUNTER — Other Ambulatory Visit: Payer: Self-pay

## 2023-02-24 VITALS — BP 118/82 | Ht 64.0 in | Wt 250.0 lb

## 2023-02-24 DIAGNOSIS — G959 Disease of spinal cord, unspecified: Secondary | ICD-10-CM

## 2023-02-24 DIAGNOSIS — M4802 Spinal stenosis, cervical region: Secondary | ICD-10-CM | POA: Diagnosis not present

## 2023-02-24 DIAGNOSIS — M5412 Radiculopathy, cervical region: Secondary | ICD-10-CM

## 2023-02-24 DIAGNOSIS — Z01818 Encounter for other preprocedural examination: Secondary | ICD-10-CM

## 2023-02-24 NOTE — Patient Instructions (Signed)
Please see below for information in regards to your upcoming surgery:   Planned surgery: C4-6 anterior cervical discectomy and fusion   Surgery date: 03/11/23 at [facility]: [address], [place], [place] [address]) - you will find out your arrival time the business day before your surgery.   Pre-op appointment at [facility] Pre-admit Testing: we will call you with a date/time for this. If you are scheduled for an in person appointment, Pre-admit Testing is located on the first floor of the [facility] building, [address], [address]. Please bring all prescriptions in the original prescription bottles to your appointment. During this appointment, they will advise you which medications you can take the morning of surgery, and which medications you will need to hold for surgery. Labs (such as blood work, EKG) may be done at your pre-op appointment. You are not required to fast for these labs. Should you need to change your pre-op appointment, please call Pre-admit testing at [phone].      Diabetes/weight loss medications:  Metformin: hold for 2 days prior to surgery     NSAIDS (Non-steroidal anti-inflammatory drugs): because you are having a fusion, please avoid taking any NSAIDS (examples: ibuprofen, motrin, aleve, naproxen, meloxicam, diclofenac) for 3 months after surgery. Celebrex is an exception and is OK to take, if prescribed. Tylenol is not an NSAID.    Common restrictions after surgery: No bending, lifting, or twisting ("BLT"). Avoid lifting objects heavier than 10 pounds for the first 6 weeks after surgery. Where possible, avoid household activities that involve lifting, bending, reaching, pushing, or pulling such as laundry, vacuuming, grocery shopping, and childcare. Try to arrange for help from friends and family for these activities while you heal. Do not drive while taking prescription pain medication. Weeks 6  through 12 after surgery: avoid lifting more than 25 pounds.    X-rays after surgery: Because you are having a fusion or arthroplasty: for appointments after your 2 week follow-up: please arrive at the [facility] outpatient imaging center ([address], [address], [place]) or [facility] one hour prior to your appointment for x-rays. This applies to every appointment after your 2 week follow-up. Failure to do so may result in your appointment being rescheduled.   How to contact us:  If you have any questions/concerns before or after surgery, you can reach [place] at [phone], or you can send a mychart message. We can be reached by phone or mychart 8am-4pm, Monday-Friday.  *Please note: Calls after 4pm are forwarded to a third party answering service. Mychart messages are not routinely monitored during evenings, weekends, and holidays. Please call our office to contact the answering service for urgent concerns during non-business hours.     If you have FMLA/disability paperwork, please drop it off or fax it to [phone], attention Patty.   Appointments/FMLA & disability paperwork: Joycelyn Rua, & Flonnie Hailstone Registered Nurse/Surgery scheduler: Royston Cowper Medical Assistants: Nash Mantis Physician Assistants: Joan Flores, PA-C, Manning Charity, PA-C & Drake Leach, PA-C Surgeons: Venetia Night, MD & Ernestine Mcmurray, MD

## 2023-02-24 NOTE — Progress Notes (Signed)
Referring Physician:  Mort Sawyers, FNP [address] [place],  [place] [address]  Primary Physician:  Mort Sawyers, FNP  History of Present Illness: 02/24/2023 Nicole Cervantes presents for follow-up for cervical myelopathy and radiculopathy.  Since her last visit, she has tried physical therapy.  She was discharged due to lack of improvement.  She was sent home on a home exercise program.  She notes today that she has continued discomfort in her neck and also discomfort into her for second and third fingers.  She is also having balance issues.  She has some tingling in her hands.  12/25/2022 Nicole Cervantes is here today with a chief complaint of neck pain with extension to the left trapezius ridge and shoulder and into the left arm that stops at the elbow. She also has pain in the left hand into the thumb and pointer finger.  She has been having severe pain since October 2024.  It is gotten slightly better over the past couple of weeks.  Sitting and laying down make it worse.  Standing and walking improves her pain.  Her pain goes into her shoulder blade and down her left arm.  She previously had an episode of this in 2018, but it subsequently improved.  She denies any overt symptoms of myelopathy.   Bowel/Bladder Dysfunction: none  Conservative measures:  Physical therapy: has participated in before for her neck in 2018, none since then.  Multimodal medical therapy including regular antiinflammatories: Prednisone, flexeril, Hydrocodone,   Injections: 11/28/2022: Left C4-5 transforaminal ESI(no relief) 09/12/2016: Right L5-S1 transforaminal ESI (good relief) 06/06/2016: Right C4-5 transforaminal ESI (good relief)    Past Surgery: no prior spinal surgeries   Nicole Cervantes has no symptoms of cervical myelopathy.  The symptoms are causing a significant impact on the patient's life.   I have utilized the care everywhere function in epic to review the outside records available  from external health systems.  Review of Systems:  A 10 point review of systems is negative, except for the pertinent positives and negatives detailed in the HPI.  Past Medical History: Past Medical History:  Diagnosis Date   Allergy    Anxiety    Back pain    Depression    Diabetes mellitus without complication (HCC)    Hypertension    DR. GRANDIS - [facility]   Migraines     Past Surgical History: Past Surgical History:  Procedure Laterality Date   ABDOMINAL HYSTERECTOMY  05/2007   IN [place], [place] - TOTAL EXCEPT OVARIES, LEIOMYOMA   COLONOSCOPY  2006   DX   Cyst removal to lower back     LAPAROSCOPY  2000   BURNED HOLES IN OVARIES TO RELEASE PRESSURE FROM CYSTS.  DR. BURNS, [place]   OVARIAN CYST REMOVAL     SKIN BIOPSY     TONSILLECTOMY      Allergies: Allergies as of 02/24/2023 - Review Complete 02/24/2023  Allergen Reaction Noted   Ozempic (0.25 or 0.5 mg-dose) [semaglutide(0.25 or 0.5mg -dos)] Other (See Comments) 09/09/2022   Mounjaro [tirzepatide] Rash 10/16/2022    Medications:  Current Outpatient Medications:    ACCU-CHEK GUIDE test strip, USE TO CHECK BLOOD SUGAR UP TO 4 TIMES DAILY AS DIRECTED, Disp: , Rfl:    Accu-Chek Softclix Lancets lancets, SMARTSIG:Topical 1-4 Times Daily, Disp: , Rfl:    amLODipine (NORVASC) 10 MG tablet, Take 1 tablet by mouth once daily, Disp: 90 tablet, Rfl: 1   atorvastatin (LIPITOR)  10 MG tablet, Take 1 tablet by mouth once daily, Disp: 90 tablet, Rfl: 0   blood glucose meter kit and supplies, Dispense based on patient and insurance preference. Use up to four times daily as directed. (FOR ICD-10 E10.9, E11.9)., Disp: 1 each, Rfl: 0   buPROPion (WELLBUTRIN) 75 MG tablet, Take 1 tablet (75 mg total) by mouth in the morning., Disp: 90 tablet, Rfl: 1   clobetasol cream (TEMOVATE) 0.05 %, Apply 1 Application topically 2 (two) times daily., Disp: 30 g, Rfl: 11   clotrimazole-betamethasone (LOTRISONE) cream, Apply externally BID prn  sx up to 2 wks, Disp: 15 g, Rfl: 1   famotidine (PEPCID) 20 MG tablet, Take 1 tablet (20 mg total) by mouth daily as needed., Disp: , Rfl:    gabapentin (NEURONTIN) 100 MG capsule, Take 100 mg by mouth 2 (two) times daily., Disp: , Rfl:    loratadine (CLARITIN) 10 MG tablet, Take 1 tablet by mouth daily., Disp: , Rfl:    losartan (COZAAR) 50 MG tablet, Take 1 tablet by mouth once daily, Disp: 90 tablet, Rfl: 3   metFORMIN (GLUCOPHAGE) 500 MG tablet, TAKE 1 TABLET BY MOUTH TWICE DAILY WITH A MEAL, Disp: 180 tablet, Rfl: 0   rOPINIRole (REQUIP) 0.25 MG tablet, Take 1 tablet (0.25 mg total) by mouth at bedtime., Disp: 90 tablet, Rfl: 3   sertraline (ZOLOFT) 100 MG tablet, Take 2 tablets by mouth once daily, Disp: 180 tablet, Rfl: 1   triamcinolone cream (KENALOG) 0.1 %, Apply 1 Application topically 2 (two) times daily as needed., Disp: , Rfl:    SUMAtriptan (IMITREX) 100 MG tablet, Take by mouth., Disp: , Rfl:   Social History: Social History   Tobacco Use   Smoking status: Former    Current packs/day: 0.00    Types: Cigarettes    Start date: 1989    Quit date: 2009    Years since quitting: 16.0   Smokeless tobacco: Never  Vaping Use   Vaping status: Never Used  Substance Use Topics   Alcohol use: Yes    Comment: occasionally once a month   Drug use: No    Family Medical History: Family History  Problem Relation Age of Onset   Diabetes Mother        passed away 06-08-21   Hypertension Mother    Depression Mother    Stroke Mother    Throat cancer Mother    Alcohol abuse Father    Prostate cancer Father 65   Diabetes Father    Hypertension Father    Bipolar disorder Sister    Bladder Cancer Sister 74       in remission   Lupus Sister    Bipolar disorder Sister    Depression Maternal Aunt    Depression Maternal Uncle    Alcohol abuse Maternal Grandfather    Depression Maternal Grandfather    Alcohol abuse Maternal Grandmother    Heart disease Maternal Grandmother     Depression Maternal Grandmother    Bipolar disorder Other     Physical Examination: Vitals:   02/24/23 1046  BP: 118/82    General: Patient is in no apparent distress. Attention to examination is appropriate.  Neck:   Supple.  Full range of motion.  Respiratory: Patient is breathing without any difficulty.   NEUROLOGICAL:     Awake, alert, oriented to person, place, and time.  Speech is clear and fluent.   Cranial Nerves: Pupils equal round and reactive to light.  Facial  tone is symmetric.  Facial sensation is symmetric. Shoulder shrug is symmetric. Tongue protrusion is midline.  There is no pronator drift.  Strength: Side Biceps Triceps Deltoid Interossei Grip Wrist Ext. Wrist Flex.  R 5 5 5 5  4+ 5 5  L 4+ 5 5 5  4+ 5 5   Side Iliopsoas Quads Hamstring PF DF EHL  R 5 5 5 5 5 5   L 5 5 5 5 5 5    Reflexes are 3+ and symmetric at the biceps, triceps, brachioradialis, patella and achilles.   Hoffman's is present bilaterally.   Bilateral upper and lower extremity sensation is intact to light touch.    No evidence of dysmetria noted.  Gait is slightly wide-based.  She has moderate difficulty with tandem gait..     Medical Decision Making  Imaging: MRI C spine 11/22/2022 Disc levels:   C2-3: Uncovertebral spurring asymmetric to the left with mild foraminal narrowing by gradient images   C3-4: Mild disc bulging and facet spurring.  No neural impingement.   C4-5: Disc narrowing and bulging with uncovertebral ridging. Disc osteophyte complex indents the ventral cord on a chronic basis and there is biforaminal impingement.   C5-6: Disc narrowing and bulging with uncovertebral spurring. Biforaminal impingement.   C6-7: Disc narrowing and degenerative fatty marrow conversion. Mild disc bulging. Mild left foraminal narrowing from uncovertebral spur primarily.   C7-T1:Negative.   IMPRESSION: 1. Mild progression of degeneration since 2018. 2. Disc degeneration  especially at C4-5 to C6-7. 3. Biforaminal impingement at C4-5 and C5-6. Spinal canal narrowing at both of these levels with ventral cord indentation at C4-5, central stenosis stable from 2018.     Electronically Signed   By: Tiburcio Pea M.D.   On: 11/22/2022 09:51  MR Brain 12/01/2022 IMPRESSION: No acute intracranial process. No evidence of acute or subacute infarct.     Electronically Signed   By: Wiliam Ke M.D.   On: 12/01/2022 18:39  I have personally reviewed the images and agree with the above interpretation.  Assessment and Plan: Ms. Binkley is a pleasant 48 y.o. female with cervical stenosis causing cervical myelopathy and cervical radiculopathy.  Since her last visit, her myelopathic symptoms have become more prominent.  She tried physical therapy without improvement.  At this point, she has continued symptoms of cervical myelopathy and cervical radiculopathy due to cervical stenosis from C4-C6.  I do not feel that further conservative management is indicated given her worsening cervical myelopathy.  I recommended C4-6 anterior cervical discectomy and fusion.    I discussed the planned procedure at length with the patient, including the risks, benefits, alternatives, and indications. The risks discussed include but are not limited to bleeding, infection, need for reoperation, spinal fluid leak, stroke, vision loss, anesthetic complication, coma, paralysis, and even death. We also discussed the possibility of post-operative dysphagia, vocal cord paralysis, and the risk of adjacent segment disease in the future. I also described in detail that improvement was not guaranteed.  The patient expressed understanding of these risks, and asked that we proceed with surgery. I described the surgery in layman's terms, and gave ample opportunity for questions, which were answered to the best of my ability.  I spent a total of 20 minutes in this patient's care today. This time was  spent reviewing pertinent records including imaging studies, obtaining and confirming history, performing a directed evaluation, formulating and discussing my recommendations, and documenting the visit within the medical record.    Thank you for involving  me in the care of this patient.      Zendayah Hardgrave K. Myer Haff MD, [facility] Neurosurgery

## 2023-03-04 ENCOUNTER — Other Ambulatory Visit: Payer: Self-pay

## 2023-03-04 ENCOUNTER — Encounter
Admission: RE | Admit: 2023-03-04 | Discharge: 2023-03-04 | Disposition: A | Discharge status: Home / Self Care | Payer: BC Managed Care – PPO | Source: Ambulatory Visit | Attending: Neurosurgery | Admitting: Neurosurgery

## 2023-03-04 DIAGNOSIS — E782 Mixed hyperlipidemia: Secondary | ICD-10-CM | POA: Diagnosis not present

## 2023-03-04 DIAGNOSIS — I10 Essential (primary) hypertension: Secondary | ICD-10-CM | POA: Insufficient documentation

## 2023-03-04 DIAGNOSIS — E1165 Type 2 diabetes mellitus with hyperglycemia: Secondary | ICD-10-CM | POA: Insufficient documentation

## 2023-03-04 DIAGNOSIS — Z01818 Encounter for other preprocedural examination: Secondary | ICD-10-CM | POA: Insufficient documentation

## 2023-03-04 DIAGNOSIS — Z01812 Encounter for preprocedural laboratory examination: Secondary | ICD-10-CM

## 2023-03-04 DIAGNOSIS — Z01810 Encounter for preprocedural cardiovascular examination: Secondary | ICD-10-CM

## 2023-03-04 HISTORY — DX: Polycystic ovarian syndrome: E28.2

## 2023-03-04 HISTORY — DX: Type 2 diabetes mellitus with hyperglycemia: E11.65

## 2023-03-04 HISTORY — DX: Other spondylosis with myelopathy, cervical region: M47.12

## 2023-03-04 HISTORY — DX: Major depressive disorder, single episode, unspecified: F32.9

## 2023-03-04 HISTORY — DX: Other migraine, not intractable, without status migrainosus: G43.809

## 2023-03-04 HISTORY — DX: Carpal tunnel syndrome, unspecified upper limb: G56.00

## 2023-03-04 HISTORY — DX: Mixed hyperlipidemia: E78.2

## 2023-03-04 HISTORY — DX: Iron deficiency anemia, unspecified: D50.9

## 2023-03-04 HISTORY — DX: Obstructive sleep apnea (adult) (pediatric): G47.33

## 2023-03-04 HISTORY — DX: Other spondylosis with radiculopathy, cervical region: M47.22

## 2023-03-04 HISTORY — DX: Essential (primary) hypertension: I10

## 2023-03-04 LAB — BASIC METABOLIC PANEL
Anion gap: 13 (ref 5–15)
BUN: 13 mg/dL (ref 6–20)
CO2: 24 mmol/L (ref 22–32)
Calcium: 9.4 mg/dL (ref 8.9–10.3)
Chloride: 102 mmol/L (ref 98–111)
Creatinine, Ser: 0.73 mg/dL (ref 0.44–1.00)
GFR, Estimated: >60 mL/min (ref >60)
Glucose, Bld: 138 mg/dL — ABNORMAL HIGH (ref 70–99)
Potassium: 3.5 mmol/L (ref 3.5–5.1)
Sodium: 139 mmol/L (ref 135–145)

## 2023-03-04 LAB — HEMOGLOBIN A1C
Hgb A1c MFr Bld: 6.4 % — ABNORMAL HIGH (ref 4.8–5.6)
Mean Plasma Glucose: 136.98 mg/dL

## 2023-03-04 LAB — TYPE AND SCREEN
ABO/RH(D): O POS
Antibody Screen: NEGATIVE

## 2023-03-04 LAB — CBC
HCT: 40.6 % (ref 36.0–46.0)
Hemoglobin: 13.6 g/dL (ref 12.0–15.0)
MCH: 29.8 pg (ref 26.0–34.0)
MCHC: 33.5 g/dL (ref 30.0–36.0)
MCV: 89 fL (ref 80.0–100.0)
Platelets: 241 10*3/uL (ref 150–400)
RBC: 4.56 MIL/uL (ref 3.87–5.11)
RDW: 13.4 % (ref 11.5–15.5)
WBC: 8.6 10*3/uL (ref 4.0–10.5)
nRBC: 0 % (ref 0.0–0.2)

## 2023-03-04 LAB — SURGICAL PCR SCREEN
MRSA, PCR: NEGATIVE
Staphylococcus aureus: NEGATIVE

## 2023-03-04 NOTE — Patient Instructions (Addendum)
Your procedure is scheduled on: Wednesday, February 5 Report to the Registration Desk on the 1st floor of the [facility] To find out your arrival time, please call [phone] between 1PM - 3PM on: Tuesday, February 4 If your arrival time is 6:00 am, do not arrive before that time as the [facility] entrance doors do not open until 6:00 am.  REMEMBER: Instructions that are not followed completely may result in serious medical risk, up to and including death; or upon the discretion of your surgeon and anesthesiologist your surgery may need to be rescheduled.  Do not eat food after midnight the night before surgery.  No gum chewing or hard candies.  You may however, drink water up to 2 hours before you are scheduled to arrive for your surgery. Do not drink anything within 2 hours of your scheduled arrival time.  One week prior to surgery: starting January 29 Stop ANY OVER THE COUNTER supplements until after surgery. Stop multiple vitamins, vitamin B12.  You may however, continue to take Tylenol if needed for pain up until the day of surgery.  Metformin - hold for 2 days before surgery. Last day to take is Sunday, February 2. Resume AFTER surgery.  Continue taking all of your other prescription medications up until the day of surgery.  ON THE DAY OF SURGERY ONLY TAKE THESE MEDICATIONS WITH SIPS OF WATER:  Amlodipine Atorvastatin Bupropion Omeprazole Sertraline  No Alcohol for 24 hours before or after surgery.  No Smoking including e-cigarettes for 24 hours before surgery.  No chewable tobacco products for at least 6 hours before surgery.  No nicotine patches on the day of surgery.  Do not use any "recreational" drugs for at least a week (preferably 2 weeks) before your surgery.  Please be advised that the combination of cocaine and anesthesia may have negative outcomes, up to and including death. If you test positive for cocaine, your surgery will be cancelled.  On the  morning of surgery brush your teeth with toothpaste and water, you may rinse your mouth with mouthwash if you wish. Do not swallow any toothpaste or mouthwash.  Use CHG Soap as directed on instruction sheet.  Do not wear jewelry, make-up, hairpins, clips or nail polish.  For welded (permanent) jewelry: bracelets, anklets, waist bands, etc.  Please have this removed prior to surgery.  If it is not removed, there is a chance that hospital personnel will need to cut it off on the day of surgery.  Do not wear lotions, powders, or perfumes.   Do not shave body hair from the neck down 48 hours before surgery.  Contact lenses, hearing aids and dentures may not be worn into surgery.  Do not bring valuables to the hospital. [facility] is not responsible for any missing/lost belongings or valuables.   Bring your C-PAP to the hospital in case you may have to spend the night.   Notify your doctor if there is any change in your medical condition (cold, fever, infection).  Wear comfortable clothing (specific to your surgery type) to the hospital.  After surgery, you can help prevent lung complications by doing breathing exercises.  Take deep breaths and cough every 1-2 hours.   If you are being discharged the day of surgery, you will not be allowed to drive home. You will need a responsible individual to drive you home and stay with you for 24 hours after surgery.   If you are taking public transportation, you will need to  have a responsible individual with you.  Please call the Pre-admissions Testing Dept. at [phone] if you have any questions about these instructions.  Surgery Visitation Policy:  Patients having surgery or a procedure may have two visitors.  Children under the age of 44 must have an adult with them who is not the patient.  Temporary Visitor Restrictions Due to increasing cases of flu, RSV and COVID-19: Children ages 37 and under will not be able to visit patients  in [facility] hospitals under most circumstances.       Pre-operative 5 CHG Bath Instructions   You can play a key role in reducing the risk of infection after surgery. Your skin needs to be as free of germs as possible. You can reduce the number of germs on your skin by washing with CHG (chlorhexidine gluconate) soap before surgery. CHG is an antiseptic soap that kills germs and continues to kill germs even after washing.   DO NOT use if you have an allergy to chlorhexidine/CHG or antibacterial soaps. If your skin becomes reddened or irritated, stop using the CHG and notify one of our RNs at [phone].   Please shower with the CHG soap starting 4 days before surgery using the following schedule:     Please keep in mind the following:  DO NOT shave, including legs and underarms, starting the day of your first shower.   You may shave your face at any point before/day of surgery.  Place clean sheets on your bed the day you start using CHG soap. Use a clean washcloth (not used since being washed) for each shower. DO NOT sleep with pets once you start using the CHG.   CHG Shower Instructions:  If you choose to wash your hair and private area, wash first with your normal shampoo/soap.  After you use shampoo/soap, rinse your hair and body thoroughly to remove shampoo/soap residue.  Turn the water OFF and apply about 3 tablespoons (45 ml) of CHG soap to a CLEAN washcloth.  Apply CHG soap ONLY FROM YOUR NECK DOWN TO YOUR TOES (washing for 3-5 minutes)  DO NOT use CHG soap on face, private areas, open wounds, or sores.  Pay special attention to the area where your surgery is being performed.  If you are having back surgery, having someone wash your back for you may be helpful. Wait 2 minutes after CHG soap is applied, then you may rinse off the CHG soap.  Pat dry with a clean towel  Put on clean clothes/pajamas   If you choose to wear lotion, please use ONLY the CHG-compatible lotions  on the back of this paper.     Additional instructions for the day of surgery: DO NOT APPLY any lotions, deodorants, cologne, or perfumes.   Put on clean/comfortable clothes.  Brush your teeth.  Ask your nurse before applying any prescription medications to the skin.      CHG Compatible Lotions   Aveeno Moisturizing lotion  Cetaphil Moisturizing Cream  Cetaphil Moisturizing Lotion  Clairol Herbal Essence Moisturizing Lotion, Dry Skin  Clairol Herbal Essence Moisturizing Lotion, Extra Dry Skin  Clairol Herbal Essence Moisturizing Lotion, Normal Skin  Curel Age Defying Therapeutic Moisturizing Lotion with Alpha Hydroxy  Curel Extreme Care Body Lotion  Curel Soothing Hands Moisturizing Hand Lotion  Curel Therapeutic Moisturizing Cream, Fragrance-Free  Curel Therapeutic Moisturizing Lotion, Fragrance-Free  Curel Therapeutic Moisturizing Lotion, Original Formula  Eucerin Daily Replenishing Lotion  Eucerin Dry Skin Therapy Plus Alpha Hydroxy Crme  Eucerin  Dry Skin Therapy Plus Alpha Hydroxy Lotion  Eucerin Original Crme  Eucerin Original Lotion  Eucerin Plus Crme Eucerin Plus Lotion  Eucerin TriLipid Replenishing Lotion  Keri Anti-Bacterial Hand Lotion  Keri Deep Conditioning Original Lotion Dry Skin Formula Softly Scented  Keri Deep Conditioning Original Lotion, Fragrance Free Sensitive Skin Formula  Keri Lotion Fast Absorbing Fragrance Free Sensitive Skin Formula  Keri Lotion Fast Absorbing Softly Scented Dry Skin Formula  Keri Original Lotion  Keri Skin Renewal Lotion Keri Silky Smooth Lotion  Keri Silky Smooth Sensitive Skin Lotion  Nivea Body Creamy Conditioning Oil  Nivea Body Extra Enriched Teacher, adult education Moisturizing Lotion Nivea Crme  Nivea Skin Firming Lotion  NutraDerm 30 Skin Lotion  NutraDerm Skin Lotion  NutraDerm Therapeutic Skin Cream  NutraDerm Therapeutic Skin Lotion  ProShield Protective Hand Cream  Provon  moisturizing lotion

## 2023-03-07 HISTORY — PX: OTHER SURGICAL HISTORY: SHX169

## 2023-03-11 ENCOUNTER — Ambulatory Visit: Payer: BC Managed Care – PPO | Admitting: Urgent Care

## 2023-03-11 ENCOUNTER — Encounter: Payer: Self-pay | Admitting: Neurosurgery

## 2023-03-11 ENCOUNTER — Encounter
Admission: RE | Disposition: A | Discharge status: Home / Self Care | Payer: Self-pay | Source: Ambulatory Visit | Attending: Neurosurgery

## 2023-03-11 ENCOUNTER — Ambulatory Visit: Payer: BC Managed Care – PPO | Admitting: Certified Registered"

## 2023-03-11 ENCOUNTER — Other Ambulatory Visit: Payer: Self-pay

## 2023-03-11 ENCOUNTER — Ambulatory Visit: Payer: BC Managed Care – PPO

## 2023-03-11 ENCOUNTER — Ambulatory Visit
Admission: RE | Admit: 2023-03-11 | Discharge: 2023-03-11 | Disposition: A | Discharge status: Home / Self Care | Payer: BC Managed Care – PPO | Source: Ambulatory Visit | Attending: Neurosurgery | Admitting: Neurosurgery

## 2023-03-11 DIAGNOSIS — G4733 Obstructive sleep apnea (adult) (pediatric): Secondary | ICD-10-CM | POA: Diagnosis not present

## 2023-03-11 DIAGNOSIS — F319 Bipolar disorder, unspecified: Secondary | ICD-10-CM | POA: Diagnosis not present

## 2023-03-11 DIAGNOSIS — M5412 Radiculopathy, cervical region: Secondary | ICD-10-CM | POA: Diagnosis not present

## 2023-03-11 DIAGNOSIS — M4802 Spinal stenosis, cervical region: Secondary | ICD-10-CM | POA: Diagnosis present

## 2023-03-11 DIAGNOSIS — Z981 Arthrodesis status: Secondary | ICD-10-CM | POA: Diagnosis not present

## 2023-03-11 DIAGNOSIS — F419 Anxiety disorder, unspecified: Secondary | ICD-10-CM | POA: Diagnosis not present

## 2023-03-11 DIAGNOSIS — I10 Essential (primary) hypertension: Secondary | ICD-10-CM | POA: Diagnosis not present

## 2023-03-11 DIAGNOSIS — G959 Disease of spinal cord, unspecified: Secondary | ICD-10-CM | POA: Diagnosis present

## 2023-03-11 DIAGNOSIS — E119 Type 2 diabetes mellitus without complications: Secondary | ICD-10-CM | POA: Insufficient documentation

## 2023-03-11 DIAGNOSIS — Z87891 Personal history of nicotine dependence: Secondary | ICD-10-CM | POA: Diagnosis not present

## 2023-03-11 DIAGNOSIS — Z6841 Body Mass Index (BMI) 40.0 and over, adult: Secondary | ICD-10-CM | POA: Diagnosis not present

## 2023-03-11 DIAGNOSIS — G43809 Other migraine, not intractable, without status migrainosus: Secondary | ICD-10-CM | POA: Insufficient documentation

## 2023-03-11 DIAGNOSIS — E782 Mixed hyperlipidemia: Secondary | ICD-10-CM

## 2023-03-11 DIAGNOSIS — G992 Myelopathy in diseases classified elsewhere: Secondary | ICD-10-CM | POA: Insufficient documentation

## 2023-03-11 DIAGNOSIS — Z79899 Other long term (current) drug therapy: Secondary | ICD-10-CM | POA: Diagnosis not present

## 2023-03-11 DIAGNOSIS — Z01812 Encounter for preprocedural laboratory examination: Secondary | ICD-10-CM

## 2023-03-11 DIAGNOSIS — E66813 Obesity, class 3: Secondary | ICD-10-CM | POA: Insufficient documentation

## 2023-03-11 DIAGNOSIS — Z01818 Encounter for other preprocedural examination: Secondary | ICD-10-CM

## 2023-03-11 DIAGNOSIS — M48062 Spinal stenosis, lumbar region with neurogenic claudication: Secondary | ICD-10-CM | POA: Diagnosis not present

## 2023-03-11 DIAGNOSIS — Z7984 Long term (current) use of oral hypoglycemic drugs: Secondary | ICD-10-CM | POA: Insufficient documentation

## 2023-03-11 DIAGNOSIS — M5002 Cervical disc disorder with myelopathy, mid-cervical region, unspecified level: Secondary | ICD-10-CM | POA: Diagnosis not present

## 2023-03-11 DIAGNOSIS — E1165 Type 2 diabetes mellitus with hyperglycemia: Secondary | ICD-10-CM

## 2023-03-11 DIAGNOSIS — Z01810 Encounter for preprocedural cardiovascular examination: Secondary | ICD-10-CM

## 2023-03-11 HISTORY — PX: ANTERIOR CERVICAL DECOMP/DISCECTOMY FUSION: SHX1161

## 2023-03-11 LAB — GLUCOSE, CAPILLARY
Glucose-Capillary: 134 mg/dL — ABNORMAL HIGH (ref 70–99)
Glucose-Capillary: 159 mg/dL — ABNORMAL HIGH (ref 70–99)

## 2023-03-11 SURGERY — ANTERIOR CERVICAL DECOMPRESSION/DISCECTOMY FUSION 2 LEVELS
Anesthesia: General | Site: Neck

## 2023-03-11 MED ORDER — 0.9 % SODIUM CHLORIDE (POUR BTL) OPTIME
TOPICAL | Status: DC | PRN
  Administered 2023-03-11: 500 mL

## 2023-03-11 MED ORDER — CEFAZOLIN SODIUM-DEXTROSE 2-4 GM/100ML-% IV SOLN
INTRAVENOUS | Status: AC
  Filled 2023-03-11: qty 100

## 2023-03-11 MED ORDER — SODIUM CHLORIDE 0.9 % IV SOLN: INTRAVENOUS | Status: DC | PRN

## 2023-03-11 MED ORDER — BUPIVACAINE-EPINEPHRINE (PF) 0.5% -1:200000 IJ SOLN
INTRAMUSCULAR | Status: DC | PRN
  Administered 2023-03-11: 10 mL

## 2023-03-11 MED ORDER — ONDANSETRON HCL 4 MG/2ML IJ SOLN
INTRAMUSCULAR | Status: AC
  Filled 2023-03-11: qty 2

## 2023-03-11 MED ORDER — OXYCODONE HCL 5 MG PO TABS
ORAL_TABLET | ORAL | Status: AC
  Filled 2023-03-11: qty 1

## 2023-03-11 MED ORDER — LIDOCAINE HCL (PF) 2 % IJ SOLN
INTRAMUSCULAR | Status: AC
  Filled 2023-03-11: qty 5

## 2023-03-11 MED ORDER — PROPOFOL 1000 MG/100ML IV EMUL
INTRAVENOUS | Status: AC
  Filled 2023-03-11: qty 100

## 2023-03-11 MED ORDER — HYDROMORPHONE HCL 1 MG/ML IJ SOLN
INTRAMUSCULAR | Status: AC
  Filled 2023-03-11: qty 1

## 2023-03-11 MED ORDER — DEXAMETHASONE SODIUM PHOSPHATE 10 MG/ML IJ SOLN
INTRAMUSCULAR | Status: DC | PRN
  Administered 2023-03-11: 5 mg via INTRAVENOUS

## 2023-03-11 MED ORDER — PHENYLEPHRINE HCL-NACL 20-0.9 MG/250ML-% IV SOLN
INTRAVENOUS | Status: DC | PRN
  Administered 2023-03-11: 30 ug/min via INTRAVENOUS

## 2023-03-11 MED ORDER — ORAL CARE MOUTH RINSE: 15.0000 mL | Freq: Once | OROMUCOSAL | Status: AC

## 2023-03-11 MED ORDER — PROPOFOL 500 MG/50ML IV EMUL
INTRAVENOUS | Status: DC | PRN
  Administered 2023-03-11: 75 ug/kg/min via INTRAVENOUS

## 2023-03-11 MED ORDER — CHLORHEXIDINE GLUCONATE 0.12 % MT SOLN
15.0000 mL | Freq: Once | OROMUCOSAL | Status: AC
Start: 2023-03-11 — End: 2023-03-11
  Administered 2023-03-11: 15 mL via OROMUCOSAL

## 2023-03-11 MED ORDER — DEXAMETHASONE SODIUM PHOSPHATE 10 MG/ML IJ SOLN
INTRAMUSCULAR | Status: AC
  Filled 2023-03-11: qty 1

## 2023-03-11 MED ORDER — SODIUM CHLORIDE 0.9 % IV SOLN: INTRAVENOUS | Status: DC

## 2023-03-11 MED ORDER — HYDROMORPHONE HCL 1 MG/ML IJ SOLN
INTRAMUSCULAR | Status: DC | PRN
  Administered 2023-03-11 (×2): .5 mg via INTRAVENOUS

## 2023-03-11 MED ORDER — SUCCINYLCHOLINE CHLORIDE 200 MG/10ML IV SOSY
PREFILLED_SYRINGE | INTRAVENOUS | Status: DC | PRN
  Administered 2023-03-11: 140 mg via INTRAVENOUS

## 2023-03-11 MED ORDER — SURGIFLO WITH THROMBIN (HEMOSTATIC MATRIX KIT) OPTIME
TOPICAL | Status: DC | PRN
  Administered 2023-03-11: 1 via TOPICAL

## 2023-03-11 MED ORDER — OXYCODONE HCL 5 MG PO TABS
5.0000 mg | ORAL_TABLET | Freq: Once | ORAL | Status: AC
  Administered 2023-03-11: 5 mg via ORAL

## 2023-03-11 MED ORDER — CEFAZOLIN SODIUM-DEXTROSE 2-4 GM/100ML-% IV SOLN
2.0000 g | Freq: Once | INTRAVENOUS | Status: AC
Start: 2023-03-11 — End: 2023-03-11
  Administered 2023-03-11: 2 g via INTRAVENOUS

## 2023-03-11 MED ORDER — MIDAZOLAM HCL 2 MG/2ML IJ SOLN
INTRAMUSCULAR | Status: AC
  Filled 2023-03-11: qty 2

## 2023-03-11 MED ORDER — OXYCODONE HCL 5 MG/5ML PO SOLN: 5.0000 mg | Freq: Once | ORAL | Status: AC | PRN

## 2023-03-11 MED ORDER — MIDAZOLAM HCL 2 MG/2ML IJ SOLN
INTRAMUSCULAR | Status: DC | PRN
  Administered 2023-03-11: 2 mg via INTRAVENOUS

## 2023-03-11 MED ORDER — ACETAMINOPHEN 10 MG/ML IV SOLN
1000.0000 mg | Freq: Once | INTRAVENOUS | Status: DC | PRN
  Administered 2023-03-11: 1000 mg via INTRAVENOUS

## 2023-03-11 MED ORDER — PHENYLEPHRINE HCL-NACL 20-0.9 MG/250ML-% IV SOLN
INTRAVENOUS | Status: AC
  Filled 2023-03-11: qty 250

## 2023-03-11 MED ORDER — LACTATED RINGERS IV SOLN: INTRAVENOUS | Status: DC | PRN

## 2023-03-11 MED ORDER — ONDANSETRON HCL 4 MG/2ML IJ SOLN: 4.0000 mg | Freq: Once | INTRAMUSCULAR | Status: DC | PRN

## 2023-03-11 MED ORDER — BUPIVACAINE-EPINEPHRINE (PF) 0.5% -1:200000 IJ SOLN
INTRAMUSCULAR | Status: AC
  Filled 2023-03-11: qty 10

## 2023-03-11 MED ORDER — FENTANYL CITRATE (PF) 100 MCG/2ML IJ SOLN
INTRAMUSCULAR | Status: AC
  Filled 2023-03-11: qty 2

## 2023-03-11 MED ORDER — LIDOCAINE HCL (CARDIAC) PF 100 MG/5ML IV SOSY
PREFILLED_SYRINGE | INTRAVENOUS | Status: DC | PRN
  Administered 2023-03-11: 60 mg via INTRAVENOUS

## 2023-03-11 MED ORDER — FENTANYL CITRATE (PF) 100 MCG/2ML IJ SOLN
INTRAMUSCULAR | Status: DC | PRN
  Administered 2023-03-11: 100 ug via INTRAVENOUS

## 2023-03-11 MED ORDER — METHOCARBAMOL 500 MG PO TABS
500.0000 mg | ORAL_TABLET | Freq: Four times a day (QID) | ORAL | Status: DC
  Administered 2023-03-11: 500 mg via ORAL

## 2023-03-11 MED ORDER — CHLORHEXIDINE GLUCONATE 0.12 % MT SOLN
OROMUCOSAL | Status: AC
  Filled 2023-03-11: qty 15

## 2023-03-11 MED ORDER — ONDANSETRON HCL 4 MG/2ML IJ SOLN
INTRAMUSCULAR | Status: DC | PRN
  Administered 2023-03-11: 4 mg via INTRAVENOUS

## 2023-03-11 MED ORDER — METHOCARBAMOL 500 MG PO TABS
ORAL_TABLET | ORAL | Status: AC
  Filled 2023-03-11: qty 1

## 2023-03-11 MED ORDER — PROPOFOL 10 MG/ML IV BOLUS
INTRAVENOUS | Status: AC
  Filled 2023-03-11: qty 20

## 2023-03-11 MED ORDER — OXYCODONE HCL 5 MG PO TABS
5.0000 mg | ORAL_TABLET | Freq: Once | ORAL | Status: AC | PRN
  Administered 2023-03-11: 5 mg via ORAL

## 2023-03-11 MED ORDER — METHOCARBAMOL 500 MG PO TABS: 500.0000 mg | ORAL_TABLET | Freq: Four times a day (QID) | ORAL | 0 refills | Status: DC

## 2023-03-11 MED ORDER — FENTANYL CITRATE (PF) 100 MCG/2ML IJ SOLN
25.0000 ug | INTRAMUSCULAR | Status: DC | PRN
  Administered 2023-03-11 (×4): 25 ug via INTRAVENOUS

## 2023-03-11 MED ORDER — OXYCODONE HCL 5 MG PO TABS: 5.0000 mg | ORAL_TABLET | ORAL | 0 refills | Status: DC | PRN

## 2023-03-11 MED ORDER — SENNA 8.6 MG PO TABS: 1.0000 | ORAL_TABLET | Freq: Two times a day (BID) | ORAL | 0 refills | Status: DC | PRN

## 2023-03-11 MED ORDER — ACETAMINOPHEN 10 MG/ML IV SOLN
INTRAVENOUS | Status: AC
  Filled 2023-03-11: qty 100

## 2023-03-11 MED ORDER — PROPOFOL 10 MG/ML IV BOLUS
INTRAVENOUS | Status: DC | PRN
  Administered 2023-03-11: 160 mg via INTRAVENOUS

## 2023-03-11 SURGICAL SUPPLY — 48 items
ALLOGRAFT BONE FIBER KORE 1CC (Bone Implant) IMPLANT
BASIN KIT SINGLE STR (MISCELLANEOUS) ×1 IMPLANT
BUR NEURO DRILL SOFT 3.0X3.8M (BURR) ×1 IMPLANT
DERMABOND ADVANCED .7 DNX12 (GAUZE/BANDAGES/DRESSINGS) ×1 IMPLANT
DRAIN CHANNEL JP 10F RND 20C F (MISCELLANEOUS) IMPLANT
DRAPE C ARM PK CFD 31 SPINE (DRAPES) ×1 IMPLANT
DRAPE LAPAROTOMY 77X122 PED (DRAPES) ×1 IMPLANT
DRAPE MICROSCOPE SPINE 48X150 (DRAPES) ×1 IMPLANT
DRSG TEGADERM 4X4.75 (GAUZE/BANDAGES/DRESSINGS) IMPLANT
ELECT REM PT RETURN 9FT ADLT (ELECTROSURGICAL) ×1
ELECTRODE REM PT RTRN 9FT ADLT (ELECTROSURGICAL) ×1 IMPLANT
EVACUATOR 1/8 PVC DRAIN (DRAIN) IMPLANT
EVACUATOR SILICONE 100CC (DRAIN) IMPLANT
FEE INTRAOP CADWELL SUPPLY NCS (MISCELLANEOUS) IMPLANT
FEE INTRAOP MONITOR IMPULS NCS (MISCELLANEOUS) IMPLANT
GAUZE SPONGE 2X2 STRL 8-PLY (GAUZE/BANDAGES/DRESSINGS) IMPLANT
GLOVE BIOGEL PI IND STRL 6.5 (GLOVE) ×1 IMPLANT
GLOVE SURG SYN 6.5 ES PF (GLOVE) ×1
GLOVE SURG SYN 6.5 PF PI (GLOVE) ×1 IMPLANT
GLOVE SURG SYN 8.5 E (GLOVE) ×3
GLOVE SURG SYN 8.5 PF PI (GLOVE) ×3 IMPLANT
GOWN SRG LRG LVL 4 IMPRV REINF (GOWNS) ×1 IMPLANT
GOWN SRG XL LVL 3 NONREINFORCE (GOWNS) ×1 IMPLANT
INTRAOP CADWELL SUPPLY FEE NCS (MISCELLANEOUS) ×1
INTRAOP MONITOR FEE IMPULS NCS (MISCELLANEOUS) ×1
KIT TURNOVER KIT A (KITS) ×1 IMPLANT
MANIFOLD NEPTUNE II (INSTRUMENTS) ×1 IMPLANT
NDL SAFETY ECLIPSE 18X1.5 (NEEDLE) ×1 IMPLANT
NS IRRIG 500ML POUR BTL (IV SOLUTION) ×1 IMPLANT
PACK LAMINECTOMY ARMC (PACKS) ×1 IMPLANT
PAD ARMBOARD 7.5X6 YLW CONV (MISCELLANEOUS) ×2 IMPLANT
PIN CASPAR 14 (PIN) ×1 IMPLANT
PIN CASPAR 14MM (PIN) ×1
PLATE ACP 1.6X38 2LVL (Plate) IMPLANT
SCREW ACP 3.5X17 S/D VARIA (Screw) IMPLANT
SPACER C HEDRON 12X14 7M 7D (Spacer) IMPLANT
SPACER HEDRON C 12X14X8 7D (Spacer) IMPLANT
SPONGE KITTNER 5P (MISCELLANEOUS) ×1 IMPLANT
STAPLER SKIN PROX 35W (STAPLE) IMPLANT
SURGIFLO W/THROMBIN 8M KIT (HEMOSTASIS) ×1 IMPLANT
SUT ETHILON 3-0 FS-10 30 BLK (SUTURE)
SUT STRATA 3-0 15 PS-2 (SUTURE) ×1 IMPLANT
SUT VIC AB 3-0 SH 8-18 (SUTURE) ×1 IMPLANT
SUTURE EHLN 3-0 FS-10 30 BLK (SUTURE) IMPLANT
SYR 20ML LL LF (SYRINGE) ×1 IMPLANT
TAPE CLOTH 3X10 WHT NS LF (GAUZE/BANDAGES/DRESSINGS) ×2 IMPLANT
TRAP FLUID SMOKE EVACUATOR (MISCELLANEOUS) ×1 IMPLANT
WATER STERILE IRR 500ML POUR (IV SOLUTION) IMPLANT

## 2023-03-11 NOTE — Op Note (Signed)
 Indications: [name] is a 48 y.o. female with G95.9 Cervical myelopathy , M48.02 Cervical spinal stenosis, M54.12 Cervical radiculopathy . Due to ongoing symptoms and lack of reasonable conservative management options, the patient opted for surgical intervention.  Findings: stenosis, successful decompression  Preoperative Diagnosis: G95.9 Cervical myelopathy , M48.02 Cervical spinal stenosis, M54.12 Cervical radiculopathy  Postoperative Diagnosis: same   EBL: 25 ml IVF: see anesthesia record Drains: none Disposition: Extubated and Stable to PACU Complications: none  No foley catheter was placed.  Preoperative Note:   Risks of surgery discussed and documented in clinic note.  Operative Note:   Procedure:  1) Anterior cervical diskectomy and fusion at C4/5 and C5/6 2) Anterior cervical instrumentation at C4 - 6 3) Placement of biomechanical devices at C4/5 and C5/6  4) Use of operative microscope 5) Use of flouroscopy   Procedure: After obtaining informed consent, the patient taken to the operating room, placed in supine position, general anesthesia induced.  The patient had a small shoulder roll placed behind their shoulders.  The patient received preop antibiotics and IV Decadron .  The patient had a neck incision outlined, was prepped and draped in usual sterile fashion. The incision was injected with local anesthetic.   An incision was opened, dissection taken down medial to the carotid artery and jugular vein, lateral to the trachea and esophagus.  The prevertebral fascia identified and a localizing x-ray demonstrated the correct level.  The longus colli were dissected laterally, and self-retaining retractors placed to open the operative field. The microscope was then brought into the field.  With this complete, distractor pins were placed in the vertebral bodies of C4 and C6. The distractor was placed, and the annuli at C4/5 and C5/6 were opened using a bovie.  Curettes and  pituitary rongeurs used to remove the majority of C4/5 disk, then the drill was used to remove the posterior osteophyte and begin the foraminotomies. The nerve hook was used to elevate the posterior longitudinal ligament, which was then removed with Kerrison rongeurs to complete decompression of the spinal cord. The Kerrison rongeurs were then used to complete the foraminotomies bilaterally to decompress the nerve roots. The nerve hook could be passed out each foramen, ensuring decompression of the nerve roots. Meticulous hemostasis was obtained. A biomechanical device (Globus Hedron C 8 mm height x 14 mm width by 12 mm depth) was placed at C4/5. The device had been filled with demineralized bone matrix for aid in arthrodesis.  Please note that the procedure included removal of the disc, removal of the posterior osteophytes, and removal of the posterior longitudinal ligament to ensure decompression of the spinal cord.  Additionally, foraminotomies were performed on both sides of the spinal canal to decompress the nerve roots.  We then moved to the C5/6 level. Curettes and pituitary rongeurs used to remove the majority of the disk, then the drill was used to remove the posterior osteophyte and begin the foraminotomies. The nerve hook was used to elevate the posterior longitudinal ligament, which was then removed with Kerrison rongeurs to complete decompression of the spinal cord. The Kerrison rongeurs were then used to complete the foraminotomies bilaterally to decompress the nerve roots. The nerve hook could be passed out each foramen, ensuring decompression of the nerve roots. Meticulous hemostasis was obtained. A biomechanical device (Globus Hedron C 7 mm height x 14 mm width by 12 mm depth) was placed at C5/6. The device had been filled with demineralized bone matrix for aid in arthrodesis.  Please note that the procedure included removal of the disc, removal of the posterior osteophytes, and removal of the  posterior longitudinal ligament to ensure decompression of the spinal cord.  Additionally, foraminotomies were performed on both sides of the spinal canal to decompress the nerve roots.  The caspar distractor was removed, and bone wax used for hemostasis. A separate, 36 mm 3 segment Nuvasive ACP plate was chosen to bridge the 3 segments.  Two screws placed in each vertebral body, respectively making sure the screws were behind the locking mechanism.  Final AP and lateral radiographs were taken.   Please note that the plate is not inclusive to the biomechanical devices.  The anchoring mechanism of the plate is completely separate from the biomechanical devices.  With everything in good position, the wound was irrigated copiously and meticulous hemostasis obtained.  Wound was closed in 2 layers using interrupted inverted 3-0 Vicryl sutures in the platysma and 3-0 monocryl on the dermis.  The wound was dressed with dermabond, the head of bed at 30 degrees, taken to recovery room in stable condition.  No new postop neurological deficits were identified.  Sponge and pattie counts were correct at the end of the procedure.   Monitoring was stable throughout.  I performed the entire procedure with the assistance of Edsel Goods PA as an designer, television/film set. An assistant was required for this procedure due to the complexity.  The assistant provided assistance in tissue manipulation and suction, and was required for the successful and safe performance of the procedure. I performed the critical portions of the procedure.   Reeves Daisy MD

## 2023-03-11 NOTE — Anesthesia Procedure Notes (Signed)
 Procedure Name: Intubation Date/Time: 03/11/2023 8:22 AM  Performed by: Niki Manus SAUNDERS, CRNAPre-anesthesia Checklist: Patient identified, Patient being monitored, Timeout performed, Emergency Drugs available and Suction available Patient Re-evaluated:Patient Re-evaluated prior to induction Oxygen Delivery Method: Circle system utilized Preoxygenation: Pre-oxygenation with 100% oxygen Induction Type: IV induction Ventilation: Mask ventilation without difficulty Laryngoscope Size: McGrath and 4 Grade View: Grade II Tube type: Oral Tube size: 6.5 mm Number of attempts: 1 Airway Equipment and Method: Stylet Placement Confirmation: ETT inserted through vocal cords under direct vision, positive ETCO2 and breath sounds checked- equal and bilateral Secured at: 21 cm Tube secured with: Tape Dental Injury: Teeth and Oropharynx as per pre-operative assessment

## 2023-03-11 NOTE — Interval H&P Note (Signed)
 History and Physical Interval Note:  03/11/2023 8:08 AM  Nicole Cervantes  has presented today for surgery, with the diagnosis of G95.9 Cervical myelopathy  M48.02 Cervical spinal stenosis M54.12 Cervical radiculopathy.  The various methods of treatment have been discussed with the patient and family. After consideration of risks, benefits and other options for treatment, the patient has consented to  Procedure(s): C4-6 ANTERIOR CERVICAL DISCECTOMY AND FUSION (N/A) as a surgical intervention.  The patient's history has been reviewed, patient examined, no change in status, stable for surgery.  I have reviewed the patient's chart and labs.  Questions were answered to the patient's satisfaction.    Heart sounds normal no MRG. Chest Clear to Auscultation Bilaterally.   Lynzy Rawles

## 2023-03-11 NOTE — Anesthesia Preprocedure Evaluation (Signed)
 Anesthesia Evaluation  Patient identified by MRN, date of birth, ID band Patient awake    Reviewed: Allergy & Precautions, NPO status , Patient's Chart, lab work & pertinent test results  History of Anesthesia Complications Negative for: history of anesthetic complications  Airway Mallampati: II  TM Distance: <3 FB Neck ROM: Full    Dental no notable dental hx. (+) Teeth Intact   Pulmonary sleep apnea and Continuous Positive Airway Pressure Ventilation , neg COPD, Patient abstained from smoking.Not current smoker, former smoker   Pulmonary exam normal breath sounds clear to auscultation       Cardiovascular Exercise Tolerance: Good METShypertension, Pt. on medications (-) CAD and (-) Past MI (-) dysrhythmias  Rhythm:Regular Rate:Normal - Systolic murmurs    Neuro/Psych  Headaches PSYCHIATRIC DISORDERS Anxiety Depression Bipolar Disorder      GI/Hepatic ,neg GERD  ,,(+)     (-) substance abuse    Endo/Other  diabetes, Well Controlled, Type 2, Oral Hypoglycemic Agents  Class 3 obesity  Renal/GU negative Renal ROS     Musculoskeletal  (+) Arthritis ,    Abdominal  (+) + obese  Peds  Hematology   Anesthesia Other Findings Past Medical History: No date: Allergy No date: Anxiety No date: Back pain No date: Bipolar 1 disorder (HCC) No date: Carpal tunnel syndrome     Comment:  bilateral No date: Cervical spondylosis with myelopathy and radiculopathy No date: Depression No date: Essential hypertension     Comment:  DR. ROJELIO - [facility] No date: Iron deficiency anemia No date: Major depressive disorder No date: Migraines No date: Mixed hyperlipidemia No date: Obstructive sleep apnea on CPAP No date: Polycystic ovarian syndrome No date: Type 2 diabetes mellitus with hyperglycemia, without long- term current use of insulin (HCC) No date: Vestibular migraine  Reproductive/Obstetrics                              Anesthesia Physical Anesthesia Plan  ASA: 3  Anesthesia Plan: General   Post-op Pain Management: Ofirmev  IV (intra-op)*   Induction: Intravenous  PONV Risk Score and Plan: 4 or greater and Ondansetron , Dexamethasone , Midazolam , TIVA and Propofol  infusion  Airway Management Planned: Oral ETT and Video Laryngoscope Planned  Additional Equipment: None  Intra-op Plan:   Post-operative Plan: Extubation in OR  Informed Consent: I have reviewed the patients History and Physical, chart, labs and discussed the procedure including the risks, benefits and alternatives for the proposed anesthesia with the patient or authorized representative who has indicated his/her understanding and acceptance.     Dental advisory given  Plan Discussed with: CRNA and Surgeon  Anesthesia Plan Comments: (Discussed risks of anesthesia with patient, including PONV, sore throat, lip/dental/eye damage. Rare risks discussed as well, such as cardiorespiratory and neurological sequelae, and allergic reactions. Discussed the role of CRNA in patient's perioperative care. Patient understands. Patient informed about increased incidence of above perioperative risk due to high BMI. Patient understands.  )       Anesthesia Quick Evaluation

## 2023-03-11 NOTE — Discharge Summary (Signed)
 Discharge Summary  Patient ID: Nicole Cervantes MRN: [id number] DOB/AGE: 1975-05-21 48 y.o.  Admit date: 03/11/2023 Discharge date: 03/11/2023  Admission Diagnoses: G95.9 Cervical myelopathy , M48.02 Cervical spinal stenosis, M54.12 Cervical radiculopathy  Discharge Diagnoses:  Active Problems:   Cervical myelopathy (HCC)   Cervical spinal stenosis   Cervical radiculopathy   Discharged Condition: good  Hospital Course:  Nicole Cervantes is a 48 y.o presenting with cervical stenosis and myelopathy s/p C4-6 ACDF. Her intraoperative course was uncomplicated. She was monitored in PACU for 4 hours and discharged home after ambulating, urinating, and tolerating PO intake. She was given prescriptions for Oxycodone , Robaxin , and Senna to take as needed.   Consults: None  Significant Diagnostic Studies: none  Treatments: surgery: as above. Please see separately dictated operative report for further details   Discharge Exam: Blood pressure (!) 144/78, pulse 90, temperature 97.8 F (36.6 C), temperature source Oral, resp. rate 20, height 5' 4 (1.626 m), weight 114.3 kg, SpO2 94%. CN grossly intact MAEW Incision c/d/I with dermabond in place  Disposition: Discharge disposition: 01-Home or Self Care        Allergies as of 03/11/2023       Reactions   Ozempic  (0.25 Or 0.5 Mg-dose) [semaglutide (0.25 Or 0.5mg -dos)] Other (See Comments)   heartburn   Mounjaro  [tirzepatide ] Rash        Medication List     STOP taking these medications    ibuprofen 200 MG tablet Commonly known as: ADVIL       TAKE these medications    Accu-Chek Guide test strip Generic drug: glucose blood USE TO CHECK BLOOD SUGAR UP TO 4 TIMES DAILY AS DIRECTED   Accu-Chek Softclix Lancets lancets SMARTSIG:Topical 1-4 Times Daily   acetaminophen  500 MG tablet Commonly known as: TYLENOL  Take 500-1,000 mg by mouth every 6 (six) hours as needed (pain.).   amLODipine  10 MG tablet Commonly known as:  NORVASC  Take 1 tablet by mouth once daily   atorvastatin  10 MG tablet Commonly known as: LIPITOR Take 1 tablet by mouth once daily   blood glucose meter kit and supplies Dispense based on patient and insurance preference. Use up to four times daily as directed. (FOR ICD-10 E10.9, E11.9).   buPROPion  75 MG tablet Commonly known as: WELLBUTRIN  Take 1 tablet (75 mg total) by mouth in the morning.   clobetasol  cream 0.05 % Commonly known as: TEMOVATE  Apply 1 Application topically 2 (two) times daily. What changed:  when to take this reasons to take this   clotrimazole -betamethasone  cream Commonly known as: LOTRISONE  Apply externally BID prn sx up to 2 wks What changed:  how much to take how to take this when to take this reasons to take this additional instructions   cyanocobalamin  1000 MCG tablet Commonly known as: VITAMIN B12 Take 1,000 mcg by mouth in the morning.   gabapentin 100 MG capsule Commonly known as: NEURONTIN Take 100 mg by mouth at bedtime as needed (pain.).   losartan  50 MG tablet Commonly known as: COZAAR  Take 1 tablet by mouth once daily   metFORMIN  500 MG tablet Commonly known as: GLUCOPHAGE  TAKE 1 TABLET BY MOUTH TWICE DAILY WITH A MEAL   methocarbamol  500 MG tablet Commonly known as: ROBAXIN  Take 1 tablet (500 mg total) by mouth 4 (four) times daily.   multivitamin with minerals Tabs tablet Take 1 tablet by mouth in the morning.   omeprazole  20 MG capsule Commonly known as: PRILOSEC Take 20 mg by mouth every other day. In  the morning.   oxyCODONE  5 MG immediate release tablet Commonly known as: Roxicodone  Take 1 tablet (5 mg total) by mouth every 4 (four) hours as needed for severe pain (pain score 7-10).   rOPINIRole  0.25 MG tablet Commonly known as: REQUIP  Take 1 tablet (0.25 mg total) by mouth at bedtime.   senna 8.6 MG Tabs tablet Commonly known as: SENOKOT Take 1 tablet (8.6 mg total) by mouth 2 (two) times daily as needed.    sertraline  100 MG tablet Commonly known as: ZOLOFT  Take 2 tablets by mouth once daily        Follow-up Information     Gregory Edsel Ruth, PA Follow up on 03/26/2023.   Specialty: Neurosurgery Contact information: [address] [place] [address] [phone]                 Signed: Edsel Ruth Gregory 03/11/2023, 2:42 PM

## 2023-03-11 NOTE — Transfer of Care (Signed)
 Immediate Anesthesia Transfer of Care Note  Patient: Nicole Cervantes  Procedure(s) Performed: C4-6 ANTERIOR CERVICAL DISCECTOMY AND FUSION (Neck)  Patient Location: PACU  Anesthesia Type:General  Level of Consciousness: awake and alert   Airway & Oxygen Therapy: Patient Spontanous Breathing and Patient connected to face mask oxygen  Post-op Assessment: Report given to RN and Post -op Vital signs reviewed and stable  Post vital signs: Reviewed and stable  Last Vitals:  Vitals Value Taken Time  BP 141/98 03/11/23 1045  Temp    Pulse 93 03/11/23 1048  Resp 20 03/11/23 1048  SpO2 95 % 03/11/23 1048  Vitals shown include unfiled device data.  Last Pain:  Vitals:   03/11/23 0716  TempSrc: Oral  PainSc: 0-No pain         Complications: No notable events documented.

## 2023-03-11 NOTE — Discharge Instructions (Addendum)
 Your surgeon has performed an operation on your cervical spine (neck) to relieve pressure on the spinal cord and/or nerves. This involved making an incision in the front of your neck and removing one or more of the discs that support your spine. Next, a small piece of bone, a titanium plate, and screws were used to fuse two or more of the vertebrae (bones) together.  The following are instructions to help in your recovery once you have been discharged from the hospital. Even if you feel well, it is important that you follow these activity guidelines. If you do not let your neck heal properly from the surgery, you can increase the chance of return of your symptoms and other complications.  * Do not take anti-inflammatory medications for 3 months after surgery (naproxen [Aleve], ibuprofen [Advil, Motrin], etc.). These medications can prevent your bones from healing properly.  Celebrex, if prescribed, is ok to take.  Activity    No bending, lifting, or twisting ("BLT"). Avoid lifting objects heavier than 10 pounds (gallon milk jug).  Where possible, avoid household activities that involve lifting, bending, reaching, pushing, or pulling such as laundry, vacuuming, grocery shopping, and childcare. Try to arrange for help from friends and family for these activities while your back heals.  Increase physical activity slowly as tolerated.  Taking short walks is encouraged, but avoid strenuous exercise. Do not jog, run, bicycle, lift weights, or participate in any other exercises unless specifically allowed by your doctor.  Talk to your doctor before resuming sexual activity.  You should not drive until cleared by your doctor.  Until released by your doctor, you should not return to work or school.  You should rest at home and let your body heal.   You may shower three days after your surgery.  After showering, lightly dab your incision dry. Do not take a tub bath or go swimming until approved by your  doctor at your follow-up appointment.  If your doctor ordered a cervical collar (neck brace) for you, you should wear it whenever you are out of bed. You may remove it when lying down or sleeping, but you should wear it at all other times. Not all neck surgeries require a cervical collar.  If you smoke, we strongly recommend that you quit.  Smoking has been proven to interfere with normal bone healing and will dramatically reduce the success rate of your surgery. Please contact [facility] (800-QUIT-NOW) and use the resources at www.[facility].com for assistance in stopping smoking.  Surgical Incision   If you have a dressing on your incision, you may remove it two days after your surgery. Keep your incision area clean and dry.  If you have staples or stitches on your incision, you should have a follow up scheduled for removal. If you do not have staples or stitches, you will have steri-strips (small pieces of surgical tape) or Dermabond glue. The steri-strips/glue should begin to peel away within about a week (it is fine if the steri-strips fall off before then). If the strips are still in place one week after your surgery, you may gently remove them.  Diet           You may return to your usual diet. However, you may experience discomfort when swallowing in the first month after your surgery. This is normal. You may find that softer foods are more comfortable for you to swallow. Be sure to stay hydrated.  When to Contact us  You may experience pain in your  neck and/or pain between your shoulder blades. This is normal and should improve in the next few weeks with the help of pain medication, muscle relaxers, and rest. Some patients report that a warm compress on the back of the neck or between the shoulder blades helps.  However, should you experience any of the following, contact us immediately: New numbness or weakness Pain that is progressively getting worse, and is not relieved by your pain  medication, muscle relaxers, rest, and warm compresses Bleeding, redness, swelling, pain, or drainage from surgical incision Chills or flu-like symptoms Fever greater than 101.0 F (38.3 C) Inability to eat, drink fluids, or take medications Problems with bowel or bladder functions Difficulty breathing or shortness of breath Warmth, tenderness, or swelling in your calf Contact Information How to contact us:  If you have any questions/concerns before or after surgery, you can reach [place] at [phone], or you can send a mychart message. We can be reached by phone or mychart 8am-4pm, Monday-Friday.  *Please note: Calls after 4pm are forwarded to a third party answering service. Mychart messages are not routinely monitored during evenings, weekends, and holidays. Please call our office to contact the answering service for urgent concerns during non-business hours.

## 2023-03-11 NOTE — Progress Notes (Signed)
 Patient able to ambulate, eat, drink and void, neck incision cdi with dermabond

## 2023-03-11 NOTE — Anesthesia Postprocedure Evaluation (Signed)
 Anesthesia Post Note  Patient: Nicole Cervantes  Procedure(s) Performed: C4-6 ANTERIOR CERVICAL DISCECTOMY AND FUSION (Neck)  Patient location during evaluation: PACU Anesthesia Type: General Level of consciousness: awake and alert Pain management: pain level controlled Vital Signs Assessment: post-procedure vital signs reviewed and stable Respiratory status: spontaneous breathing, nonlabored ventilation, respiratory function stable and patient connected to nasal cannula oxygen Cardiovascular status: blood pressure returned to baseline and stable Postop Assessment: no apparent nausea or vomiting Anesthetic complications: no   No notable events documented.   Last Vitals:  Vitals:   03/11/23 1208 03/11/23 1217  BP: (!) 146/88 (!) 146/86  Pulse: 84 75  Resp: 13 18  Temp: (!) 36.3 C 36.6 C  SpO2: 94% 95%    Last Pain:  Vitals:   03/11/23 1217  TempSrc: Oral  PainSc: 4                  Rome Ade

## 2023-03-12 ENCOUNTER — Encounter: Payer: Self-pay | Admitting: Neurosurgery

## 2023-03-13 ENCOUNTER — Encounter: Payer: Self-pay | Admitting: Neurosurgery

## 2023-03-20 ENCOUNTER — Telehealth: Payer: Self-pay | Admitting: Neurosurgery

## 2023-03-20 NOTE — Telephone Encounter (Signed)
I called the patient and informed her that we do not recommend using any lotions or creams on the incision at this moment in time. She is aware that we are seeing her next week and can address her concerns about the pulling sensation in her neck then. I told her she can call us before her appointment if she needs anything else.

## 2023-03-20 NOTE — Telephone Encounter (Signed)
C4-6 ACDF on 03/11/23   Patient is calling to find out if there is something she can put on her incision site. She states that is feels dry and feels like it is pulling when she moves her neck on the left side.

## 2023-03-24 ENCOUNTER — Ambulatory Visit (INDEPENDENT_AMBULATORY_CARE_PROVIDER_SITE_OTHER): Payer: BC Managed Care – PPO | Admitting: Neurosurgery

## 2023-03-24 ENCOUNTER — Encounter: Payer: Self-pay | Admitting: Neurosurgery

## 2023-03-24 VITALS — BP 122/84 | Temp 97.9°F | Ht 64.0 in | Wt 252.0 lb

## 2023-03-24 DIAGNOSIS — G959 Disease of spinal cord, unspecified: Secondary | ICD-10-CM

## 2023-03-24 DIAGNOSIS — M5412 Radiculopathy, cervical region: Secondary | ICD-10-CM

## 2023-03-24 DIAGNOSIS — Z981 Arthrodesis status: Secondary | ICD-10-CM

## 2023-03-24 NOTE — Progress Notes (Signed)
   REFERRING PHYSICIAN:  Mort Sawyers, Fnp [address] [place],  [place] [address]  DOS: C4-6 ACDF on 03/11/23  HISTORY OF PRESENT ILLNESS: Nicole Cervantes is about 2 weeks status post ACDF. Overall, she is doing.  She reports resolution of the numbness and tingling in her hands and improve grip strength.  She is having some discomfort in her neck and tension type headaches in the morning that do not seem to be positional in nature.  She reports she has not been compliant with her CPAP and suspect this may be the reason for her headaches.  She is currently taking Tylenol and Robaxin as needed.  PHYSICAL EXAMINATION:  NEUROLOGICAL:  General: In no acute distress.   Awake, alert, oriented to person, place, and time.  Pupils equal round and reactive to light.  Facial tone is symmetric.   Strength: Side Biceps Triceps Deltoid Interossei Grip Wrist Ext. Wrist Flex.  R 5 5 5 5 5 5 5   L 5 5 5 5 5 5 5    Incision c/d/I and healing well  Imaging:  No interval imaging to review   Assessment / Plan: Nicole Cervantes is doing well after her recent ACDF.  She is still having considerable decreased range of motion although she admits this is secondary to some anxiety and less to do with pain.  I suggested she return to using her CPAP as this may be the cause of her morning headaches.  She is not ready to return to work.  I told her we can reevaluate this at her follow-up visit or sooner should she make progress over the next couple of weeks.  We discussed activity escalation and I have advised the patient to lift up to 10 pounds until 6 weeks after surgery, then increase up to 25 pounds until 12 weeks after surgery.  After 12 weeks post-op, the patient advised to increase activity as tolerated. she will return to clinic in approximately in 4 weeks to see Dr. Myer Haff or sooner should she have any questions or concerns.   Advised to contact the office if any questions or concerns arise.   Manning Charity PA-C Dept of Neurosurgery

## 2023-03-26 ENCOUNTER — Encounter: Payer: BC Managed Care – PPO | Admitting: Neurosurgery

## 2023-03-26 ENCOUNTER — Telehealth: Payer: Self-pay | Admitting: Neurosurgery

## 2023-03-26 NOTE — Telephone Encounter (Signed)
 C4-6 ACDF on 03/11/23  Patient calling that her HR department asked her to work remotely. She is asking for a note to work remotely as tolerated. She says she probably won't work more than 20 hours. You can put the letter in [employer]. When she saw Duwayne Heck last week they did discuss this.

## 2023-03-26 NOTE — Telephone Encounter (Signed)
 Letter written and sent to patient via mychart.

## 2023-04-03 ENCOUNTER — Other Ambulatory Visit: Payer: Self-pay | Admitting: Psychiatry

## 2023-04-03 DIAGNOSIS — F3342 Major depressive disorder, recurrent, in full remission: Secondary | ICD-10-CM

## 2023-04-13 ENCOUNTER — Telehealth: Payer: Self-pay | Admitting: Neurosurgery

## 2023-04-13 NOTE — Telephone Encounter (Signed)
 Patient is calling to request a work note to return to work fully. She states she can the letter from MyChart.

## 2023-04-14 ENCOUNTER — Encounter: Payer: Self-pay | Admitting: Family Medicine

## 2023-04-14 NOTE — Telephone Encounter (Signed)
 LMOM informing patient the note can be found in her Mychart. There are restrictions of no lifting up to 10 pounds until 6 weeks after surgery, then increase up to 25 pounds until 12 weeks after surgery.  After 12 weeks post-op, the patient advised to increase activity as tolerated.

## 2023-04-22 ENCOUNTER — Encounter: Payer: Self-pay | Admitting: Neurosurgery

## 2023-04-22 ENCOUNTER — Other Ambulatory Visit: Payer: Self-pay | Admitting: Family Medicine

## 2023-04-22 DIAGNOSIS — M5412 Radiculopathy, cervical region: Secondary | ICD-10-CM

## 2023-04-23 ENCOUNTER — Ambulatory Visit
Admission: RE | Admit: 2023-04-23 | Discharge: 2023-04-23 | Disposition: A | Discharge status: Home / Self Care | Attending: Neurosurgery | Admitting: Neurosurgery

## 2023-04-23 ENCOUNTER — Ambulatory Visit
Admission: RE | Admit: 2023-04-23 | Discharge: 2023-04-23 | Disposition: A | Discharge status: Home / Self Care | Source: Ambulatory Visit | Attending: Neurosurgery | Admitting: Neurosurgery

## 2023-04-23 ENCOUNTER — Ambulatory Visit (INDEPENDENT_AMBULATORY_CARE_PROVIDER_SITE_OTHER): Payer: BC Managed Care – PPO | Admitting: Neurosurgery

## 2023-04-23 VITALS — BP 130/82 | Temp 98.3°F | Ht 64.0 in | Wt 244.0 lb

## 2023-04-23 DIAGNOSIS — Z981 Arthrodesis status: Secondary | ICD-10-CM | POA: Diagnosis not present

## 2023-04-23 DIAGNOSIS — M5412 Radiculopathy, cervical region: Secondary | ICD-10-CM

## 2023-04-23 DIAGNOSIS — M4802 Spinal stenosis, cervical region: Secondary | ICD-10-CM | POA: Diagnosis not present

## 2023-04-23 NOTE — Progress Notes (Signed)
   REFERRING PHYSICIAN:  Mort Sawyers, Fnp [address] [place],  [place] [address]  DOS: C4-6 ACDF on 03/11/23  HISTORY OF PRESENT ILLNESS: MYANA SCHLUP is status post ACDF.  She is doing very well.  She is back at work.  PHYSICAL EXAMINATION:  NEUROLOGICAL:  General: In no acute distress.   Awake, alert, oriented to person, place, and time.  Pupils equal round and reactive to light.  Facial tone is symmetric.   Strength: Side Biceps Triceps Deltoid Interossei Grip Wrist Ext. Wrist Flex.  R 5 5 5 5 5 5 5   L 5 5 5 5 5 5 5    Incision c/d/I and healing well  Imaging:  No complications noted  Assessment / Plan: JANISE GORA is doing well after her recent ACDF.  I have given her some exercises for her neck.  I am pleased with her improvements.  We will see her back in clinic in approximately 6 weeks.    Venetia Night MD Dept of Neurosurgery

## 2023-04-24 ENCOUNTER — Other Ambulatory Visit: Payer: Self-pay | Admitting: Family

## 2023-04-24 DIAGNOSIS — I10 Essential (primary) hypertension: Secondary | ICD-10-CM

## 2023-04-24 DIAGNOSIS — E119 Type 2 diabetes mellitus without complications: Secondary | ICD-10-CM

## 2023-05-11 ENCOUNTER — Ambulatory Visit: Payer: Self-pay | Admitting: Psychiatry

## 2023-05-11 ENCOUNTER — Other Ambulatory Visit: Payer: Self-pay | Admitting: Family

## 2023-05-11 ENCOUNTER — Encounter: Payer: Self-pay | Admitting: Psychiatry

## 2023-05-11 VITALS — BP 118/82 | HR 83 | Temp 98.6°F | Ht 64.0 in | Wt 247.8 lb

## 2023-05-11 DIAGNOSIS — F4010 Social phobia, unspecified: Secondary | ICD-10-CM

## 2023-05-11 DIAGNOSIS — G4701 Insomnia due to medical condition: Secondary | ICD-10-CM

## 2023-05-11 DIAGNOSIS — F3342 Major depressive disorder, recurrent, in full remission: Secondary | ICD-10-CM | POA: Diagnosis not present

## 2023-05-11 DIAGNOSIS — F4000 Agoraphobia, unspecified: Secondary | ICD-10-CM

## 2023-05-11 DIAGNOSIS — E782 Mixed hyperlipidemia: Secondary | ICD-10-CM

## 2023-05-11 NOTE — Progress Notes (Signed)
 [facility] MD OP Progress Note  05/11/2023 11:00 AM Nicole Cervantes  MRN:  [id number]  Chief Complaint:  Chief Complaint  Patient presents with   Follow-up   Depression   Anxiety   Medication Refill   Discussed the use of AI scribe software for clinical note transcription with the patient, who gave verbal consent to proceed.  History of Present Illness Nicole Cervantes is a 48 year old Caucasian female, married, employed, lives in [place], has a history of major depression in remission, agoraphobia, and social anxiety, hypertension, diabetes mellitus who presents for a follow-up visit.  She is managing her mental health conditions well, including major depression in remission, agoraphobia, and social anxiety. She experiences some anxiety but has not canceled any plans recently and is actively engaging in activities she enjoys.  She is participating more in social activities, such as joining a book club and going bowling with family members, indicating improvement in her agoraphobia and social anxiety symptoms.  She continues to use a CPAP machine for her sleep apnea and sleeps through the night without issues.  Her current medications include sertraline  100 mg, Wellbutrin  75 mg daily.  She denies side effects.  She underwent cervical spine surgery on March 11, 2023, involving a fusion from C5 to C7. She reports a good recovery, having been out of work for two weeks and working from home for six weeks post-surgery. She has been back in the office for two weeks and no longer requires Norco or prednisone  post-surgery.  She denies any suicidality, homicidality or perceptual disturbances.   Visit Diagnosis:    ICD-10-CM   1. Recurrent major depressive disorder, in full remission (HCC)  F33.42     2. Agoraphobia  F40.00     3. Social anxiety disorder  F40.10     4. Insomnia due to medical condition  G47.01    Sleep apnea on CPAP, pain, mood      Past Psychiatric History: I have reviewed past  psychiatric history from progress note on 11/28/2021.  Past trials of medications like Paxil , Lamictal , Wellbutrin , Seroquel.  Past Medical History:  Past Medical History:  Diagnosis Date   Allergy    Anxiety    Back pain    Bipolar 1 disorder (HCC)    Carpal tunnel syndrome    bilateral   Cervical spondylosis with myelopathy and radiculopathy    Depression    Essential hypertension    DR. GRANDIS - [facility]   Iron deficiency anemia    Major depressive disorder    Migraines    Mixed hyperlipidemia    Obstructive sleep apnea on CPAP    Polycystic ovarian syndrome    Type 2 diabetes mellitus with hyperglycemia, without long-term current use of insulin (HCC)    Vestibular migraine     Past Surgical History:  Procedure Laterality Date   ABDOMINAL HYSTERECTOMY  05/2007   IN [place], [place] - TOTAL EXCEPT OVARIES, LEIOMYOMA   ANTERIOR CERVICAL DECOMP/DISCECTOMY FUSION N/A 03/11/2023   Procedure: C4-6 ANTERIOR CERVICAL DISCECTOMY AND FUSION;  Surgeon: Clois Fret, MD;  Location: [facility] ORS;  Service: Neurosurgery;  Laterality: N/A;   COLONOSCOPY  2006   DX   COLONOSCOPY  2024   LAPAROSCOPY  2000   BURNED HOLES IN OVARIES TO RELEASE PRESSURE FROM CYSTS.  DR. BURNS, [place]   OVARIAN CYST REMOVAL     PILONIDAL CYST EXCISION     TONSILLECTOMY AND ADENOIDECTOMY  1978    Family Psychiatric History: I  have reviewed family psychiatric history from progress note on 11/28/2021.  Family History:  Family History  Problem Relation Age of Onset   Diabetes Mother        passed away 06-17-21   Hypertension Mother    Depression Mother    Stroke Mother    Throat cancer Mother    Alcohol abuse Father    Prostate cancer Father 45   Diabetes Father    Hypertension Father    Bipolar disorder Sister    Bladder Cancer Sister 76       in remission   Lupus Sister    Bipolar disorder Sister    Depression Maternal Aunt    Depression Maternal Uncle    Alcohol abuse Maternal Grandfather     Depression Maternal Grandfather    Alcohol abuse Maternal Grandmother    Heart disease Maternal Grandmother    Depression Maternal Grandmother    Bipolar disorder Other     Social History: I have reviewed social history from progress note on 11/28/2021. Social History   Socioeconomic History   Marital status: Married    Spouse name: Viona   Number of children: 1   Years of education: 14   Highest education level: Associate degree: academic program  Occupational History    Employer: [employer]  Tobacco Use   Smoking status: Former    Current packs/day: 0.00    Types: Cigarettes    Start date: 1989    Quit date: 2009    Years since quitting: 16.2   Smokeless tobacco: Never  Vaping Use   Vaping status: Never Used  Substance and Sexual Activity   Alcohol use: Yes    Comment: occasionally once a month   Drug use: No   Sexual activity: Yes    Birth control/protection: Surgical    Comment: Hysterectomy   Other Topics Concern   Not on file  Social History Narrative   Not on file   Social Drivers of Health   Financial Resource Strain: Low Risk  (10/12/2022)   Overall Financial Resource Strain (CARDIA)    Difficulty of Paying Living Expenses: Not hard at all  Food Insecurity: No Food Insecurity (10/12/2022)   Hunger Vital Sign    Worried About Running Out of Food in the Last Year: Never true    Ran Out of Food in the Last Year: Never true  Transportation Needs: No Transportation Needs (10/12/2022)   PRAPARE - Administrator, Civil Service (Medical): No    Lack of Transportation (Non-Medical): No  Physical Activity: Unknown (10/12/2022)   Exercise Vital Sign    Days of Exercise per Week: 0 days    Minutes of Exercise per Session: Not on file  Stress: No Stress Concern Present (10/12/2022)   [employer] of Occupational Health - Occupational Stress Questionnaire    Feeling of Stress : Only a little  Social Connections: Moderately Isolated (10/12/2022)    Social Connection and Isolation Panel [NHANES]    Frequency of Communication with Friends and Family: Three times a week    Frequency of Social Gatherings with Friends and Family: Once a week    Attends Religious Services: Never    Database Administrator or Organizations: No    Attends Engineer, Structural: Not on file    Marital Status: Married    Allergies:  Allergies  Allergen Reactions   Ozempic  (0.25 Or 0.5 Mg-Dose) [Semaglutide (0.25 Or 0.5mg -Dos)] Other (See Comments)    heartburn  Mounjaro  [Tirzepatide ] Rash    Metabolic Disorder Labs: Lab Results  Component Value Date   HGBA1C 6.4 (H) 03/04/2023   MPG 136.98 03/04/2023   MPG 117 10/25/2015   No results found for: PROLACTIN Lab Results  Component Value Date   CHOL 130 05/23/2022   TRIG 106.0 05/23/2022   HDL 45.50 05/23/2022   CHOLHDL 3 05/23/2022   VLDL 21.2 05/23/2022   LDLCALC 63 05/23/2022   LDLCALC 133 (H) 02/18/2022   Lab Results  Component Value Date   TSH 2.19 11/08/2021   TSH 4.845 (H) 10/25/2015    Therapeutic Level Labs: No results found for: LITHIUM No results found for: VALPROATE No results found for: CBMZ  Current Medications: Current Outpatient Medications  Medication Sig Dispense Refill   ACCU-CHEK GUIDE test strip USE TO CHECK BLOOD SUGAR UP TO 4 TIMES DAILY AS DIRECTED     Accu-Chek Softclix Lancets lancets SMARTSIG:Topical 1-4 Times Daily     acetaminophen  (TYLENOL ) 500 MG tablet Take 500-1,000 mg by mouth every 6 (six) hours as needed (pain.).     amLODipine  (NORVASC ) 10 MG tablet Take 1 tablet by mouth once daily 90 tablet 1   atorvastatin  (LIPITOR) 10 MG tablet Take 1 tablet by mouth once daily 90 tablet 0   blood glucose meter kit and supplies Dispense based on patient and insurance preference. Use up to four times daily as directed. (FOR ICD-10 E10.9, E11.9). 1 each 0   buPROPion  (WELLBUTRIN ) 75 MG tablet TAKE 1 TABLET BY MOUTH IN THE MORNING 90 tablet 0    clobetasol  cream (TEMOVATE ) 0.05 % Apply 1 Application topically 2 (two) times daily. (Patient taking differently: Apply 1 Application topically 2 (two) times daily as needed (skin irritation.).) 30 g 11   clotrimazole -betamethasone  (LOTRISONE ) cream Apply externally BID prn sx up to 2 wks (Patient taking differently: Apply 1 Application topically 2 (two) times daily as needed (skin irritation.).) 15 g 1   cyanocobalamin  (VITAMIN B12) 1000 MCG tablet Take 1,000 mcg by mouth in the morning.     gabapentin (NEURONTIN) 100 MG capsule Take 100 mg by mouth at bedtime as needed (pain.).     losartan  (COZAAR ) 50 MG tablet Take 1 tablet (50 mg total) by mouth daily. MUST HAVE OV FOR FURTHER REFILLS 90 tablet 0   metFORMIN  (GLUCOPHAGE ) 500 MG tablet Take 1 tablet (500 mg total) by mouth 2 (two) times daily with a meal. MUST HAVE OV FOR FURTHER REFILLS (Patient taking differently: Take 500 mg by mouth 2 (two) times daily with a meal. Takes as needed) 180 tablet 0   methocarbamol  (ROBAXIN ) 500 MG tablet Take 1 tablet (500 mg total) by mouth 4 (four) times daily. 120 tablet 0   omeprazole  (PRILOSEC) 20 MG capsule Take 20 mg by mouth every other day. In the morning.     rOPINIRole  (REQUIP ) 0.25 MG tablet Take 1 tablet (0.25 mg total) by mouth at bedtime. 90 tablet 3   sertraline  (ZOLOFT ) 100 MG tablet Take 2 tablets by mouth once daily 180 tablet 1   No current facility-administered medications for this visit.     Musculoskeletal: Strength & Muscle Tone: within normal limits Gait & Station: normal Patient leans: N/A  Psychiatric Specialty Exam: Review of Systems  Psychiatric/Behavioral: Negative.      Blood pressure 118/82, pulse 83, temperature 98.6 F (37 C), temperature source Temporal, height 5' 4 (1.626 m), weight 247 lb 12.8 oz (112.4 kg), SpO2 99%.Body mass index is 42.53 kg/m.  General Appearance:  Casual  Eye Contact:  Fair  Speech:  Clear and Coherent  Volume:  Normal  Mood:  Euthymic   Affect:  Appropriate  Thought Process:  Goal Directed and Descriptions of Associations: Intact  Orientation:  Full (Time, Place, and Person)  Thought Content: Logical   Suicidal Thoughts:  No  Homicidal Thoughts:  No  Memory:  Immediate;   Fair Recent;   Fair Remote;   Fair  Judgement:  Fair  Insight:  Fair  Psychomotor Activity:  Normal  Concentration:  Concentration: Fair and Attention Span: Fair  Recall:  [employer] of Knowledge: Fair  Language: Fair  Akathisia:  No  Handed:  Right  AIMS (if indicated): not done  Assets:  Desire for Improvement Housing Social Support Transportation  ADL's:  Intact  Cognition: WNL  Sleep:  Fair   Screenings: AUDIT    Flowsheet Row Office Visit from 10/16/2022 in [facility] Admission (Discharged) from 10/24/2015 in [facility]ICINE  Alcohol Use Disorder Identification Test Final Score (AUDIT) 4  0      GAD-7    Flowsheet Row Office Visit from 05/11/2023 in [facility] Office Visit from 10/16/2022 in [facility] [place] Office Visit from 05/15/2022 in [facility] [place] Office Visit from 03/19/2022 in [facility] Office Visit from 11/28/2021 in [facility]  Total GAD-7 Score 0 2 0 0 4      PHQ2-9    Flowsheet Row Office Visit from 05/11/2023 in [facility] Video Visit from 02/09/2023 in [facility] Office Visit from 10/16/2022 in [facility] Video Visit from 10/07/2022 in [facility] Video Visit from 07/03/2022 in [facility]  PHQ-2 Total Score 0 0 0 0 0  PHQ-9 Total Score -- -- 6 -- --      Flowsheet Row Office Visit from  05/11/2023 in [facility] Admission (Discharged) from 03/11/2023 in [facility] Video Visit from 02/09/2023 in [facility]  C-SSRS RISK CATEGORY No Risk No Risk No Risk       Assessment & Plan Nicole Cervantes is a 48 year old Caucasian female, employed, married, has a history of depression, anxiety was evaluated in office today.  Discussed assessment and plan as noted below.  Major Depressive Disorder in remission Currently in remission with significant improvement in social activities and engagement, including joining a book club and participating in family outings. No depressive symptoms reported. - Continue Sertraline  200 mg daily - Continue Wellbutrin  75 mg daily - Follow up with therapist Darice Seats  Agoraphobia and Social Anxiety Disorder-stable Improving with active participation in social activities and no recent cancellations, indicating better management of anxiety symptoms. - Continue current medication regimen - Encourage continued participation in social activities  Insomnia/Sleep Apnea-stable Managed with CPAP therapy. No current issues reported with sleep. - Continue CPAP therapy  Follow-up - Schedule next appointment by video in August  Collaboration of Care: Collaboration of Care: Referral or follow-up with counselor/therapist AEB patient encouraged to continue CBT as needed  Patient/Guardian was advised Release of Information must be obtained prior to any record release in order to collaborate their care with an outside provider. Patient/Guardian was advised if they have not  already done so to contact the registration department to sign all necessary forms in order for us  to release information regarding their care.   Consent: Patient/Guardian gives verbal consent for treatment and assignment of benefits for services provided during this visit.  Patient/Guardian expressed understanding and agreed to proceed.  This note was generated in part or whole with voice recognition software. Voice recognition is usually quite accurate but there are transcription errors that can and very often do occur. I apologize for any typographical errors that were not detected and corrected. This note was generated in part or whole with voice recognition software. Voice recognition is usually quite accurate but there are transcription errors that can and very often do occur. I apologize for any typographical errors that were not detected and corrected.       Traci Plemons, MD 05/12/2023, 5:56 PM

## 2023-05-27 ENCOUNTER — Other Ambulatory Visit: Payer: Self-pay | Admitting: Family Medicine

## 2023-05-27 DIAGNOSIS — M5412 Radiculopathy, cervical region: Secondary | ICD-10-CM

## 2023-05-28 ENCOUNTER — Ambulatory Visit
Admission: RE | Admit: 2023-05-28 | Discharge: 2023-05-28 | Disposition: A | Discharge status: Home / Self Care | Source: Ambulatory Visit | Attending: Neurosurgery | Admitting: Neurosurgery

## 2023-05-28 ENCOUNTER — Encounter: Payer: Self-pay | Admitting: Neurosurgery

## 2023-05-28 ENCOUNTER — Ambulatory Visit (INDEPENDENT_AMBULATORY_CARE_PROVIDER_SITE_OTHER): Payer: BC Managed Care – PPO | Admitting: Neurosurgery

## 2023-05-28 ENCOUNTER — Ambulatory Visit
Admission: RE | Admit: 2023-05-28 | Discharge: 2023-05-28 | Disposition: A | Discharge status: Home / Self Care | Attending: Neurosurgery | Admitting: Neurosurgery

## 2023-05-28 VITALS — BP 148/96 | Temp 98.7°F | Ht 64.0 in | Wt 247.0 lb

## 2023-05-28 DIAGNOSIS — M50123 Cervical disc disorder at C6-C7 level with radiculopathy: Secondary | ICD-10-CM | POA: Diagnosis not present

## 2023-05-28 DIAGNOSIS — Z981 Arthrodesis status: Secondary | ICD-10-CM

## 2023-05-28 DIAGNOSIS — Z4789 Encounter for other orthopedic aftercare: Secondary | ICD-10-CM | POA: Diagnosis not present

## 2023-05-28 DIAGNOSIS — M5412 Radiculopathy, cervical region: Secondary | ICD-10-CM | POA: Diagnosis not present

## 2023-05-28 DIAGNOSIS — M4802 Spinal stenosis, cervical region: Secondary | ICD-10-CM | POA: Diagnosis not present

## 2023-05-28 DIAGNOSIS — G959 Disease of spinal cord, unspecified: Secondary | ICD-10-CM

## 2023-05-28 MED ORDER — METHOCARBAMOL 500 MG PO TABS: 500.0000 mg | ORAL_TABLET | Freq: Four times a day (QID) | ORAL | 0 refills | Status: DC

## 2023-05-28 NOTE — Progress Notes (Signed)
   REFERRING PHYSICIAN:  Felicita Horns, Fnp [address] [place],  [place] [address]  DOS: C4-6 ACDF on 03/11/23  HISTORY OF PRESENT ILLNESS: 05/28/23 Nicole Cervantes is a about 2.5 months s/p ACDF. She is largely doing well however she has been having some neck pain particularly first thing in the morning and on the left side since her last visit as well as some intermittent numbness and tingling into her fingertips bilaterally.  She attributes this to the exercises and has been only doing them about once a day.  She is not currently taking any medication for pain.  04/23/23 Nicole Cervantes is status post ACDF.  She is doing very well.  She is back at work.  PHYSICAL EXAMINATION:  NEUROLOGICAL:  General: In no acute distress.   Awake, alert, oriented to person, place, and time.  Pupils equal round and reactive to light.  Facial tone is symmetric.   Strength: Side Biceps Triceps Deltoid Interossei Grip Wrist Ext. Wrist Flex.  R 5 5 5 5 5 5 5   L 5 5 5 5 5 5 5    Incision c/d/I and healing well  Imaging:  05/28/23 cervical xrays No obvious complications.   Assessment / Plan: Nicole Cervantes is doing fairly well after her recent ACDF.  She has had some increased neck pain since initiating neck exercises.  She feels like her range of motion is reasonable and that she stopped the neck exercises if they are exacerbating things.  In the meantime we will add Robaxin .  Should she continue to have numbness and tingling in her fingers we discussed potentially updating a cervical MRI.  I will see her back via telephone visit in 2 weeks to discuss her symptoms.  She will otherwise return to clinic to see Dr. Mont Antis 6 months with cervical x-rays prior.   Anastacio Karvonen PA-C Dept of Neurosurgery

## 2023-06-03 ENCOUNTER — Telehealth: Payer: Self-pay | Admitting: Neurosurgery

## 2023-06-03 NOTE — Telephone Encounter (Signed)
 C4-6 ANTERIOR CERVICAL DISCECTOMY AND FUSION  She fell this morning on to her stomach. She only feels dizzy, a little neck pain nothing extreme. Should she have xrays to make sure that nothing moved in her neck?

## 2023-06-03 NOTE — Telephone Encounter (Signed)
 Patient advised to call back if anything changes. She indicates understanding.

## 2023-06-03 NOTE — Telephone Encounter (Signed)
 C4-6 ACDF in Feb 2025  Patient called reporting a fall this morning. She slipped on grease on the floor and fell forward. She reports jerking her head back to avoid any contact with the face. Patient reports dizziness since the fall but denies hitting her head on impact. She has some increased neck pain about 4/10 that is achy in nature. She denies feeling any popping sensation when jerking her head back on impact.   Should patient get X-rays to check for any changes in hardware?

## 2023-06-11 ENCOUNTER — Ambulatory Visit: Admitting: Neurosurgery

## 2023-06-11 DIAGNOSIS — M5412 Radiculopathy, cervical region: Secondary | ICD-10-CM

## 2023-06-11 DIAGNOSIS — Z981 Arthrodesis status: Secondary | ICD-10-CM

## 2023-06-11 NOTE — Progress Notes (Signed)
 Neurosurgery Telephone (Audio-Only) Note  Requesting Provider     Felicita Horns, FNP [address] [place],  [place] [address] T: [phone] F: [phone]  Primary Care Provider Felicita Horns, FNP [address] [place] [address] T: [phone] F: [phone]  Telehealth visit was conducted with Nicole Cervantes, a 48 y.o. female via telephone.  History of Present Illness: Nicole Cervantes is a 48 y.o with a history of C4-6 ACDF on 03/11/23. She was seen on 05/28/23 with increased neck pain and new intermittent numbness and tingling into her fingertips that started with increased activity and home neck exercises. We discussed a short course of Robaxin  and monitoring her symptoms.  Today she continues to have some midline neck pain but states that has improved.  She is not sure if this is a result of the Robaxin  or just time.  She also continues to have numbness and tingling from her wrist distally that seems to be into all 5 fingers and on both sides.  She has been wearing wrist splints at night which seems to be helping some.  She denies any new or worsening symptoms.  05/28/23 Nicole Cervantes is a about 2.5 months s/p ACDF. She is largely doing well however she has been having some neck pain particularly first thing in the morning and on the left side since her last visit as well as some intermittent numbness and tingling into her fingertips bilaterally.  She attributes this to the exercises and has been only doing them about once a day.  She is not currently taking any medication for pain.   04/23/23 Nicole Cervantes is status post ACDF.  She is doing very well.  She is back at work.  General Review of Systems:  A ROS was performed including pertinent positive and negatives as documented.  All other systems are negative.   Prior to Admission medications   Medication Sig Start Date End Date Taking? Authorizing Provider  ACCU-CHEK GUIDE test strip USE TO CHECK BLOOD SUGAR UP TO  4 TIMES DAILY AS DIRECTED 11/11/21   [provider]  Accu-Chek Softclix Lancets lancets SMARTSIG:Topical 1-4 Times Daily 11/11/21   [provider]  acetaminophen  (TYLENOL ) 500 MG tablet Take 500-1,000 mg by mouth every 6 (six) hours as needed (pain.).    [provider]  amLODipine  (NORVASC ) 10 MG tablet Take 1 tablet by mouth once daily 12/19/22   Dugal, Tabitha, FNP  atorvastatin  (LIPITOR) 10 MG tablet Take 1 tablet by mouth once daily 05/11/23   Dugal, Tabitha, FNP  blood glucose meter kit and supplies Dispense based on patient and insurance preference. Use up to four times daily as directed. (FOR ICD-10 E10.9, E11.9). 11/11/21   Dugal, Tabitha, FNP  buPROPion  (WELLBUTRIN ) 75 MG tablet TAKE 1 TABLET BY MOUTH IN THE MORNING 04/03/23   Eappen, Saramma, MD  clobetasol  cream (TEMOVATE ) 0.05 % Apply 1 Application topically 2 (two) times daily. Patient taking differently: Apply 1 Application topically 2 (two) times daily as needed (skin irritation.). 05/23/22   Dugal, Tabitha, FNP  clotrimazole -betamethasone  (LOTRISONE ) cream Apply externally BID prn sx up to 2 wks Patient taking differently: Apply 1 Application topically 2 (two) times daily as needed (skin irritation.). 01/04/21   Angelita Kendall, CNM  cyanocobalamin  (VITAMIN B12) 1000 MCG tablet Take 1,000 mcg by mouth in the morning.    [provider]  gabapentin (NEURONTIN) 100 MG capsule Take 100 mg by mouth at bedtime as needed (pain.). 12/11/22  [provider]  losartan  (COZAAR ) 50 MG tablet Take 1 tablet (50 mg total) by mouth daily. MUST HAVE OV FOR FURTHER REFILLS 04/27/23   Felicita Horns, FNP  metFORMIN  (GLUCOPHAGE ) 500 MG tablet Take 1 tablet (500 mg total) by mouth 2 (two) times daily with a meal. MUST HAVE OV FOR FURTHER REFILLS Patient taking differently: Take 500 mg by mouth 2 (two) times daily with a meal. Takes as needed 04/27/23   Felicita Horns, FNP  methocarbamol  (ROBAXIN ) 500 MG tablet Take 1  tablet (500 mg total) by mouth 4 (four) times daily. 05/28/23   Noble Bateman, PA  omeprazole  (PRILOSEC) 20 MG capsule Take 20 mg by mouth every other day. In the morning.    [provider]  rOPINIRole  (REQUIP ) 0.25 MG tablet Take 1 tablet (0.25 mg total) by mouth at bedtime. 10/16/22   Dugal, Tabitha, FNP  sertraline  (ZOLOFT ) 100 MG tablet Take 2 tablets by mouth once daily 12/19/22   Felicita Horns, FNP    DATA REVIEWED    Imaging Studies  05/28/23 c spine xrays  FINDINGS: Stable cervical spine alignment straightening of normal lordosis. Anterior fusion C4 through C6 with interbody spacers. The hardware appears intact. C6-C7 disc space narrowing and spurring, unchanged. No evidence of fracture or focal bone abnormality. No prevertebral soft tissue thickening.   IMPRESSION: 1. Anterior fusion C4 through C6 without hardware complication. 2. C6-C7 degenerative disc disease.     Electronically Signed   By: Chadwick Colonel M.D.   On: 06/09/2023 17:16  IMPRESSION  Nicole Cervantes is a 48 y.o. female who I performed a telephone encounter today for evaluation and management of neck and bilateral hand numbness and tingling.   PLAN  Nicole Cervantes is a 48 y.o presenting with ongoing neck pain and numbness bilaterally after an ACDF in July of this year.  We briefly discussed the differential diagnosis of her symptoms and it is possible she has some ongoing cervical foraminal stenosis versus bilateral carpal tunnel.  We discussed the workup to include an MRI and possible EMG nerve conduction studies however she would like to continue with conservative management at this time.  She was instructed to call us  should her symptoms change or worsen or should she decide to move forward with these studies.  She will otherwise keep her 39-month follow-up with Dr. Mont Antis with cervical x-rays prior.  No orders of the defined types were placed in this encounter.  DISPOSITION  Follow up: In  person appointment in regularly scheduled post-op follow-up.  Noble Bateman, PA   TELEPHONE DOCUMENTATION  This visit was performed via telephone.  Patient location: home Provider location: office  I spent a total of 5 minutes non-face-to-face activities for this visit on the date of this encounter including review of current clinical condition and response to treatment.  The patient is aware of and accepts the limits of this telehealth visit.

## 2023-06-28 DIAGNOSIS — R051 Acute cough: Secondary | ICD-10-CM | POA: Diagnosis not present

## 2023-06-28 DIAGNOSIS — J40 Bronchitis, not specified as acute or chronic: Secondary | ICD-10-CM | POA: Diagnosis not present

## 2023-06-28 DIAGNOSIS — Z03818 Encounter for observation for suspected exposure to other biological agents ruled out: Secondary | ICD-10-CM | POA: Diagnosis not present

## 2023-07-01 ENCOUNTER — Other Ambulatory Visit: Payer: Self-pay | Admitting: Psychiatry

## 2023-07-01 DIAGNOSIS — F3342 Major depressive disorder, recurrent, in full remission: Secondary | ICD-10-CM

## 2023-07-07 ENCOUNTER — Ambulatory Visit (INDEPENDENT_AMBULATORY_CARE_PROVIDER_SITE_OTHER): Admitting: Family

## 2023-07-07 ENCOUNTER — Encounter: Payer: Self-pay | Admitting: *Deleted

## 2023-07-07 VITALS — BP 132/86 | HR 73 | Temp 98.0°F | Wt 235.0 lb

## 2023-07-07 DIAGNOSIS — Z79899 Other long term (current) drug therapy: Secondary | ICD-10-CM | POA: Insufficient documentation

## 2023-07-07 DIAGNOSIS — E119 Type 2 diabetes mellitus without complications: Secondary | ICD-10-CM

## 2023-07-07 DIAGNOSIS — R12 Heartburn: Secondary | ICD-10-CM

## 2023-07-07 DIAGNOSIS — M5412 Radiculopathy, cervical region: Secondary | ICD-10-CM

## 2023-07-07 DIAGNOSIS — Z6841 Body Mass Index (BMI) 40.0 and over, adult: Secondary | ICD-10-CM

## 2023-07-07 DIAGNOSIS — E782 Mixed hyperlipidemia: Secondary | ICD-10-CM

## 2023-07-07 DIAGNOSIS — R0789 Other chest pain: Secondary | ICD-10-CM | POA: Insufficient documentation

## 2023-07-07 DIAGNOSIS — I517 Cardiomegaly: Secondary | ICD-10-CM | POA: Insufficient documentation

## 2023-07-07 DIAGNOSIS — R0609 Other forms of dyspnea: Secondary | ICD-10-CM

## 2023-07-07 DIAGNOSIS — R600 Localized edema: Secondary | ICD-10-CM

## 2023-07-07 DIAGNOSIS — D500 Iron deficiency anemia secondary to blood loss (chronic): Secondary | ICD-10-CM | POA: Diagnosis not present

## 2023-07-07 DIAGNOSIS — E66813 Obesity, class 3: Secondary | ICD-10-CM

## 2023-07-07 DIAGNOSIS — R7989 Other specified abnormal findings of blood chemistry: Secondary | ICD-10-CM

## 2023-07-07 DIAGNOSIS — I10 Essential (primary) hypertension: Secondary | ICD-10-CM

## 2023-07-07 DIAGNOSIS — E538 Deficiency of other specified B group vitamins: Secondary | ICD-10-CM | POA: Insufficient documentation

## 2023-07-07 MED ORDER — PANTOPRAZOLE SODIUM 20 MG PO TBEC: 20.0000 mg | DELAYED_RELEASE_TABLET | Freq: Every day | ORAL | 1 refills | Status: DC

## 2023-07-07 NOTE — Assessment & Plan Note (Signed)
 Pt advised of the following:  Continue medication as prescribed. Monitor blood pressure periodically and/or when you feel symptomatic. Goal is <130/90 on average. Ensure that you have rested for 30 minutes prior to checking your blood pressure. Record your readings and bring them to your next visit if necessary.work on a low sodium diet. Urine m/a ordered  Stable today in office.

## 2023-07-07 NOTE — Assessment & Plan Note (Signed)
 A1c reviewed  Doing well.  Urine m/a ordered To help with compliance advised pt to double up dose in am and take 1000 mg total metformin  in the am.

## 2023-07-07 NOTE — Assessment & Plan Note (Signed)
 EKG reviewed NSR  Suspect heart burn  Trial stop omeprazole  start pantoprazole  20 mg once daily Try to decrease and or avoid spicy foods, fried fatty foods, and also caffeine and chocolate as these can increase heartburn symptoms.   Advised red flag symptoms.

## 2023-07-07 NOTE — Assessment & Plan Note (Signed)
 New incidental finding on xray  Currently asymptomatic Will order echocardiogram, pending results.  EKG NSR most recent

## 2023-07-07 NOTE — Progress Notes (Signed)
 Established Patient Office Visit  Subjective:      CC:  Chief Complaint  Patient presents with   Medical Management of Chronic Issues    Was seen at urgent care and was told that she had an enlarged heart on the CXR that was done.    HPI: Nicole Cervantes is a 48 y.o. female presenting on 07/07/2023 for Medical Management of Chronic Issues (Was seen at urgent care and was told that she had an enlarged heart on the CXR that was done.) . HTN: on losartan  as well as amlodipine . Doesn't check her blood pressure regularly.   HLD: on lipitor 10 mg nightly tolerating well.   DM2; on metformin  500 mg po bid however she admits non compliance, often forgets her night time meds. Did have A1c at hospital four months ago was at 6.4   Cervical fusion back in feb, still healing and following with her surgeon regularly. Next f/u is in August 2025.   MDD: taking sertraline  200 mg once daily    New complaints: She did go to urgent care walk in clinic for a dry cough that was getting worse. Incidentally a chest xray was ordered without any acute findings however was noted to have cardiomegaly of which she is here today to f/u on.   Cardiomegaly: she does at times have DOE but she also relates it to her obesity. She has had a recent illness but prior to that she is unsure if there has been any worsening. She does have chest pains often, she states they are stabbing in the mid chest and or discomfort, a few times a week has occurred for years likely. No obvious symptoms of heart burn per pt. She has bene on omeprazole  for a few years.  No palpitations. EKG 2/25 was NSR. Denies dizziness.    Was given prednisone  and zpack with improvement of symptoms, cough only slight as of now. She is taking otc allergy medication and when it's really bad will use the flonase.      Social history:  Relevant past medical, surgical, family and social history reviewed and updated as indicated. Interim medical  history since our last visit reviewed.  Allergies and medications reviewed and updated.  DATA REVIEWED: CHART IN EPIC     ROS: Negative unless specifically indicated above in HPI.    Current Outpatient Medications:    ACCU-CHEK GUIDE test strip, USE TO CHECK BLOOD SUGAR UP TO 4 TIMES DAILY AS DIRECTED, Disp: , Rfl:    Accu-Chek Softclix Lancets lancets, SMARTSIG:Topical 1-4 Times Daily, Disp: , Rfl:    acetaminophen  (TYLENOL ) 500 MG tablet, Take 500-1,000 mg by mouth every 6 (six) hours as needed (pain.)., Disp: , Rfl:    amLODipine  (NORVASC ) 10 MG tablet, Take 1 tablet by mouth once daily, Disp: 90 tablet, Rfl: 1   atorvastatin  (LIPITOR) 10 MG tablet, Take 1 tablet by mouth once daily, Disp: 90 tablet, Rfl: 0   blood glucose meter kit and supplies, Dispense based on patient and insurance preference. Use up to four times daily as directed. (FOR ICD-10 E10.9, E11.9)., Disp: 1 each, Rfl: 0   buPROPion  (WELLBUTRIN ) 75 MG tablet, TAKE 1 TABLET BY MOUTH IN THE MORNING, Disp: 90 tablet, Rfl: 0   clobetasol  cream (TEMOVATE ) 0.05 %, Apply 1 Application topically 2 (two) times daily. (Patient taking differently: Apply 1 Application topically 2 (two) times daily as needed (skin irritation.).), Disp: 30 g, Rfl: 11   clotrimazole -betamethasone  (LOTRISONE ) cream, Apply externally BID  prn sx up to 2 wks (Patient taking differently: Apply 1 Application topically 2 (two) times daily as needed (skin irritation.).), Disp: 15 g, Rfl: 1   metFORMIN  (GLUCOPHAGE ) 500 MG tablet, Take 1 tablet (500 mg total) by mouth 2 (two) times daily with a meal. MUST HAVE OV FOR FURTHER REFILLS (Patient taking differently: Take 500 mg by mouth 2 (two) times daily with a meal. Takes as needed), Disp: 180 tablet, Rfl: 0   methocarbamol  (ROBAXIN ) 500 MG tablet, Take 1 tablet (500 mg total) by mouth 4 (four) times daily., Disp: 120 tablet, Rfl: 0   pantoprazole  (PROTONIX ) 20 MG tablet, Take 1 tablet (20 mg total) by mouth daily.,  Disp: 30 tablet, Rfl: 1   rOPINIRole  (REQUIP ) 0.25 MG tablet, Take 1 tablet (0.25 mg total) by mouth at bedtime., Disp: 90 tablet, Rfl: 3   sertraline  (ZOLOFT ) 100 MG tablet, Take 2 tablets by mouth once daily, Disp: 180 tablet, Rfl: 1      Objective:     BP 132/86 (BP Location: Left Arm, Patient Position: Sitting, Cuff Size: Normal)   Pulse 73   Temp 98 F (36.7 C) (Temporal)   Wt 235 lb (106.6 kg)   SpO2 98%   BMI 40.34 kg/m   Wt Readings from Last 3 Encounters:  07/07/23 235 lb (106.6 kg)  05/28/23 247 lb (112 kg)  04/23/23 244 lb (110.7 kg)    Physical Exam Vitals reviewed.  Constitutional:      General: She is not in acute distress.    Appearance: Normal appearance. She is normal weight. She is not ill-appearing, toxic-appearing or diaphoretic.  HENT:     Head: Normocephalic.  Cardiovascular:     Rate and Rhythm: Normal rate and regular rhythm.     Comments: Non pitting 1+ edema left lower ext  Pulmonary:     Effort: Pulmonary effort is normal.     Breath sounds: Normal breath sounds.  Musculoskeletal:        General: Normal range of motion.     Right lower leg: No edema.     Left lower leg: No edema.  Neurological:     General: No focal deficit present.     Mental Status: She is alert and oriented to person, place, and time. Mental status is at baseline.  Psychiatric:        Mood and Affect: Mood normal.        Behavior: Behavior normal.        Thought Content: Thought content normal.        Judgment: Judgment normal.    Wt Readings from Last 3 Encounters:  07/07/23 235 lb (106.6 kg)  05/28/23 247 lb (112 kg)  04/23/23 244 lb (110.7 kg)   Temp Readings from Last 3 Encounters:  07/07/23 98 F (36.7 C) (Temporal)  05/28/23 98.7 F (37.1 C) (Oral)  04/23/23 98.3 F (36.8 C) (Oral)   BP Readings from Last 3 Encounters:  07/07/23 132/86  05/28/23 (!) 148/96  04/23/23 130/82   Pulse Readings from Last 3 Encounters:  07/07/23 73  03/11/23 90   03/04/23 68          Assessment & Plan:  Diabetes mellitus without complication (HCC) Assessment & Plan: A1c reviewed  Doing well.  Urine m/a ordered To help with compliance advised pt to double up dose in am and take 1000 mg total metformin  in the am.    Orders: -     Comprehensive metabolic panel with GFR; Future  Primary hypertension Assessment & Plan: Pt advised of the following:  Continue medication as prescribed. Monitor blood pressure periodically and/or when you feel symptomatic. Goal is <130/90 on average. Ensure that you have rested for 30 minutes prior to checking your blood pressure. Record your readings and bring them to your next visit if necessary.work on a low sodium diet. Urine m/a ordered  Stable today in office.   Orders: -     Microalbumin / creatinine urine ratio; Future  Cervical radiculopathy  Iron deficiency anemia due to chronic blood loss -     CBC with Differential/Platelet; Future  Mixed hyperlipidemia -     Lipid panel; Future  Class 3 severe obesity with serious comorbidity and body mass index (BMI) of 40.0 to 44.9 in adult  Heartburn -     Pantoprazole  Sodium; Take 1 tablet (20 mg total) by mouth daily.  Dispense: 30 tablet; Refill: 1  Chest discomfort Assessment & Plan: EKG reviewed NSR  Suspect heart burn  Trial stop omeprazole  start pantoprazole  20 mg once daily Try to decrease and or avoid spicy foods, fried fatty foods, and also caffeine and chocolate as these can increase heartburn symptoms.   Advised red flag symptoms.    Orders: -     Pantoprazole  Sodium; Take 1 tablet (20 mg total) by mouth daily.  Dispense: 30 tablet; Refill: 1  Long-term current use of proton pump inhibitor therapy  Pedal edema -     ECHOCARDIOGRAM COMPLETE; Future -     Comprehensive metabolic panel with GFR; Future  DOE (dyspnea on exertion) -     ECHOCARDIOGRAM COMPLETE; Future  Cardiomegaly Assessment & Plan: New incidental finding on  xray  Currently asymptomatic Will order echocardiogram, pending results.  EKG NSR most recent   Orders: -     ECHOCARDIOGRAM COMPLETE; Future  Low serum vitamin B12 -     Vitamin B12; Future  Abnormal thyroid  stimulating hormone (TSH) level -     T4, free; Future -     TSH; Future     Return in about 6 months (around 01/06/2024) for f/u CPE.  Felicita Horns, MSN, APRN, FNP-C [facility]

## 2023-07-07 NOTE — Patient Instructions (Addendum)
  I have ordered imaging for you at [facility] outpatient diagnostic center for an echocardiogram (ultrasound of the heart). This order has been sent over for you electronically.   Please call [phone] to schedule this appointment.   ------------------------------------

## 2023-07-09 ENCOUNTER — Other Ambulatory Visit (INDEPENDENT_AMBULATORY_CARE_PROVIDER_SITE_OTHER)

## 2023-07-09 DIAGNOSIS — I10 Essential (primary) hypertension: Secondary | ICD-10-CM

## 2023-07-09 DIAGNOSIS — E782 Mixed hyperlipidemia: Secondary | ICD-10-CM | POA: Diagnosis not present

## 2023-07-09 DIAGNOSIS — D500 Iron deficiency anemia secondary to blood loss (chronic): Secondary | ICD-10-CM | POA: Diagnosis not present

## 2023-07-09 DIAGNOSIS — E119 Type 2 diabetes mellitus without complications: Secondary | ICD-10-CM | POA: Diagnosis not present

## 2023-07-09 DIAGNOSIS — E538 Deficiency of other specified B group vitamins: Secondary | ICD-10-CM | POA: Diagnosis not present

## 2023-07-09 DIAGNOSIS — R600 Localized edema: Secondary | ICD-10-CM | POA: Diagnosis not present

## 2023-07-09 DIAGNOSIS — R7989 Other specified abnormal findings of blood chemistry: Secondary | ICD-10-CM

## 2023-07-09 LAB — COMPREHENSIVE METABOLIC PANEL WITH GFR
ALT: 19 U/L (ref 0–35)
AST: 17 U/L (ref 0–37)
Albumin: 4.3 g/dL (ref 3.5–5.2)
Alkaline Phosphatase: 102 U/L (ref 39–117)
BUN: 14 mg/dL (ref 6–23)
CO2: 25 meq/L (ref 19–32)
Calcium: 9.5 mg/dL (ref 8.4–10.5)
Chloride: 104 meq/L (ref 96–112)
Creatinine, Ser: 0.65 mg/dL (ref 0.40–1.20)
GFR: 102.78 mL/min (ref >60.00)
Glucose, Bld: 122 mg/dL — ABNORMAL HIGH (ref 70–99)
Potassium: 4 meq/L (ref 3.5–5.1)
Sodium: 141 meq/L (ref 135–145)
Total Bilirubin: 0.3 mg/dL (ref 0.2–1.2)
Total Protein: 7.5 g/dL (ref 6.0–8.3)

## 2023-07-09 LAB — LIPID PANEL
Cholesterol: 180 mg/dL (ref 0–200)
HDL: 48.3 mg/dL (ref >39.00)
LDL Cholesterol: 98 mg/dL (ref 0–99)
NonHDL: 132.18
Total CHOL/HDL Ratio: 4
Triglycerides: 169 mg/dL — ABNORMAL HIGH (ref 0.0–149.0)
VLDL: 33.8 mg/dL (ref 0.0–40.0)

## 2023-07-09 LAB — CBC WITH DIFFERENTIAL/PLATELET
Basophils Absolute: 0 10*3/uL (ref 0.0–0.1)
Basophils Relative: 0.4 % (ref 0.0–3.0)
Eosinophils Absolute: 0.1 10*3/uL (ref 0.0–0.7)
Eosinophils Relative: 1.7 % (ref 0.0–5.0)
HCT: 41.3 % (ref 36.0–46.0)
Hemoglobin: 13.9 g/dL (ref 12.0–15.0)
Lymphocytes Relative: 38.7 % (ref 12.0–46.0)
Lymphs Abs: 3 10*3/uL (ref 0.7–4.0)
MCHC: 33.6 g/dL (ref 30.0–36.0)
MCV: 86.2 fl (ref 78.0–100.0)
Monocytes Absolute: 0.4 10*3/uL (ref 0.1–1.0)
Monocytes Relative: 4.8 % (ref 3.0–12.0)
Neutro Abs: 4.2 10*3/uL (ref 1.4–7.7)
Neutrophils Relative %: 54.4 % (ref 43.0–77.0)
Platelets: 241 10*3/uL (ref 150.0–400.0)
RBC: 4.8 Mil/uL (ref 3.87–5.11)
RDW: 14.5 % (ref 11.5–15.5)
WBC: 7.7 10*3/uL (ref 4.0–10.5)

## 2023-07-09 LAB — T4, FREE: Free T4: 0.75 ng/dL (ref 0.60–1.60)

## 2023-07-09 LAB — VITAMIN B12: Vitamin B-12: 429 pg/mL (ref 211–911)

## 2023-07-09 LAB — MICROALBUMIN / CREATININE URINE RATIO
Creatinine,U: 281.6 mg/dL
Microalb Creat Ratio: 622.4 mg/g — ABNORMAL HIGH (ref 0.0–30.0)
Microalb, Ur: 175.3 mg/dL — ABNORMAL HIGH (ref 0.0–1.9)

## 2023-07-09 LAB — TSH: TSH: 2 u[IU]/mL (ref 0.35–5.50)

## 2023-07-10 ENCOUNTER — Ambulatory Visit: Payer: Self-pay | Admitting: Family

## 2023-07-10 DIAGNOSIS — I10 Essential (primary) hypertension: Secondary | ICD-10-CM

## 2023-07-14 ENCOUNTER — Other Ambulatory Visit (INDEPENDENT_AMBULATORY_CARE_PROVIDER_SITE_OTHER)

## 2023-07-14 ENCOUNTER — Ambulatory Visit: Payer: Self-pay | Admitting: Family

## 2023-07-14 DIAGNOSIS — E119 Type 2 diabetes mellitus without complications: Secondary | ICD-10-CM

## 2023-07-14 DIAGNOSIS — I10 Essential (primary) hypertension: Secondary | ICD-10-CM | POA: Diagnosis not present

## 2023-07-14 DIAGNOSIS — R809 Proteinuria, unspecified: Secondary | ICD-10-CM

## 2023-07-14 LAB — MICROALBUMIN / CREATININE URINE RATIO
Creatinine,U: 131.2 mg/dL
Microalb Creat Ratio: 447.3 mg/g — ABNORMAL HIGH (ref 0.0–30.0)
Microalb, Ur: 58.7 mg/dL — ABNORMAL HIGH (ref 0.0–1.9)

## 2023-07-14 MED ORDER — LOSARTAN POTASSIUM 25 MG PO TABS: 25.0000 mg | ORAL_TABLET | Freq: Every day | ORAL | 0 refills | Status: DC

## 2023-07-15 ENCOUNTER — Other Ambulatory Visit: Payer: Self-pay | Admitting: Family

## 2023-07-15 DIAGNOSIS — I10 Essential (primary) hypertension: Secondary | ICD-10-CM

## 2023-07-15 DIAGNOSIS — F411 Generalized anxiety disorder: Secondary | ICD-10-CM

## 2023-07-16 MED ORDER — LOSARTAN POTASSIUM 50 MG PO TABS
50.0000 mg | ORAL_TABLET | Freq: Every day | ORAL | Status: AC
Start: 2023-07-16 — End: ?

## 2023-07-21 ENCOUNTER — Ambulatory Visit
Admission: RE | Admit: 2023-07-21 | Discharge: 2023-07-21 | Disposition: A | Discharge status: Home / Self Care | Source: Ambulatory Visit | Attending: Family | Admitting: Family

## 2023-07-21 DIAGNOSIS — R809 Proteinuria, unspecified: Secondary | ICD-10-CM | POA: Insufficient documentation

## 2023-07-21 DIAGNOSIS — E119 Type 2 diabetes mellitus without complications: Secondary | ICD-10-CM | POA: Insufficient documentation

## 2023-07-21 DIAGNOSIS — I10 Essential (primary) hypertension: Secondary | ICD-10-CM | POA: Insufficient documentation

## 2023-07-21 DIAGNOSIS — N281 Cyst of kidney, acquired: Secondary | ICD-10-CM | POA: Diagnosis not present

## 2023-07-22 ENCOUNTER — Other Ambulatory Visit: Payer: Self-pay | Admitting: Family

## 2023-07-22 ENCOUNTER — Ambulatory Visit: Payer: Self-pay | Admitting: Family

## 2023-07-22 DIAGNOSIS — E119 Type 2 diabetes mellitus without complications: Secondary | ICD-10-CM

## 2023-07-22 DIAGNOSIS — R809 Proteinuria, unspecified: Secondary | ICD-10-CM

## 2023-07-22 DIAGNOSIS — R0789 Other chest pain: Secondary | ICD-10-CM

## 2023-07-22 DIAGNOSIS — I517 Cardiomegaly: Secondary | ICD-10-CM

## 2023-07-22 DIAGNOSIS — I10 Essential (primary) hypertension: Secondary | ICD-10-CM

## 2023-07-23 NOTE — Addendum Note (Signed)
 Addended by: Felicita Horns on: 07/23/2023 05:15 PM   Modules accepted: Orders

## 2023-08-04 ENCOUNTER — Ambulatory Visit: Payer: BC Managed Care – PPO | Admitting: Dermatology

## 2023-08-04 ENCOUNTER — Other Ambulatory Visit: Payer: Self-pay | Admitting: Dermatology

## 2023-08-04 DIAGNOSIS — D2221 Melanocytic nevi of right ear and external auricular canal: Secondary | ICD-10-CM

## 2023-08-04 DIAGNOSIS — D239 Other benign neoplasm of skin, unspecified: Secondary | ICD-10-CM

## 2023-08-04 DIAGNOSIS — D2271 Melanocytic nevi of right lower limb, including hip: Secondary | ICD-10-CM

## 2023-08-04 DIAGNOSIS — W908XXA Exposure to other nonionizing radiation, initial encounter: Secondary | ICD-10-CM

## 2023-08-04 DIAGNOSIS — L729 Follicular cyst of the skin and subcutaneous tissue, unspecified: Secondary | ICD-10-CM

## 2023-08-04 DIAGNOSIS — D1801 Hemangioma of skin and subcutaneous tissue: Secondary | ICD-10-CM

## 2023-08-04 DIAGNOSIS — Z808 Family history of malignant neoplasm of other organs or systems: Secondary | ICD-10-CM

## 2023-08-04 DIAGNOSIS — D2372 Other benign neoplasm of skin of left lower limb, including hip: Secondary | ICD-10-CM

## 2023-08-04 DIAGNOSIS — L814 Other melanin hyperpigmentation: Secondary | ICD-10-CM | POA: Diagnosis not present

## 2023-08-04 DIAGNOSIS — Z1283 Encounter for screening for malignant neoplasm of skin: Secondary | ICD-10-CM

## 2023-08-04 DIAGNOSIS — L821 Other seborrheic keratosis: Secondary | ICD-10-CM

## 2023-08-04 DIAGNOSIS — L578 Other skin changes due to chronic exposure to nonionizing radiation: Secondary | ICD-10-CM | POA: Diagnosis not present

## 2023-08-04 DIAGNOSIS — D225 Melanocytic nevi of trunk: Secondary | ICD-10-CM

## 2023-08-04 DIAGNOSIS — L280 Lichen simplex chronicus: Secondary | ICD-10-CM

## 2023-08-04 DIAGNOSIS — L209 Atopic dermatitis, unspecified: Secondary | ICD-10-CM

## 2023-08-04 DIAGNOSIS — D229 Melanocytic nevi, unspecified: Secondary | ICD-10-CM

## 2023-08-04 DIAGNOSIS — D2371 Other benign neoplasm of skin of right lower limb, including hip: Secondary | ICD-10-CM

## 2023-08-04 MED ORDER — TACROLIMUS 0.1 % EX OINT: TOPICAL_OINTMENT | CUTANEOUS | 2 refills | Status: DC

## 2023-08-04 NOTE — Progress Notes (Signed)
 Follow-Up Visit   Subjective  Nicole Cervantes is a 48 y.o. female who presents for the following: Skin Cancer Screening and Full Body Skin Exam  The patient presents for Total-Body Skin Exam (TBSE) for skin cancer screening and mole check. The patient has spots, moles and lesions to be evaluated, some may be new or changing. She has a bump on the scalp that came up yesterday, painful. She also has a cluster of dry areas on the left medial thigh.     The following portions of the chart were reviewed this encounter and updated as appropriate: medications, allergies, medical history  Review of Systems:  No other skin or systemic complaints except as noted in HPI or Assessment and Plan.  Objective  Well appearing patient in no apparent distress; mood and affect are within normal limits.  A full examination was performed including scalp, head, eyes, ears, nose, lips, neck, chest, axillae, abdomen, back, buttocks, bilateral upper extremities, bilateral lower extremities, hands, feet, fingers, toes, fingernails, and toenails. All findings within normal limits unless otherwise noted below.   Relevant physical exam findings are noted in the Assessment and Plan.    Assessment & Plan   SKIN CANCER SCREENING PERFORMED TODAY.  ACTINIC DAMAGE - Chronic condition, secondary to cumulative UV/sun exposure - diffuse scaly erythematous macules with underlying dyspigmentation - Recommend daily broad spectrum sunscreen SPF 30+ to sun-exposed areas, reapply every 2 hours as needed.  - Staying in the shade or wearing long sleeves, sun glasses (UVA+UVB protection) and wide brim hats (4-inch brim around the entire circumference of the hat) are also recommended for sun protection.  - Call for new or changing lesions.  LENTIGINES, SEBORRHEIC KERATOSES (including left knee), HEMANGIOMAS - Benign normal skin lesions - Benign-appearing - Call for any changes  MELANOCYTIC NEVI - Tan-brown and/or  pink-flesh-colored symmetric macules and papules - 7 x 3 mm medium dark brown speckled macule with adjacent 4 x 2 mm  medium brown macule of the left lower back - 9 x 4 mm regular brown macule of the left abdomen - 4 mm medium dark brown macule with notch of the R medial upper calf - 6 mm brown papule with adjacent 3 mm brown papule of the R antihelix, stable compared to photo 07/28/22 - 6 x 4 mm speckled tan macule at right medial plantar foot - Benign appearing on exam today - Observation - Call clinic for new or changing moles - Recommend daily use of broad spectrum spf 30+ sunscreen to sun-exposed areas.   DERMATOFIBROMAS Exam: Firm pink/brown papulenodules with dimple sign, left medial ankle and right anterior ankle   Treatment Plan: A dermatofibroma is a benign growth possibly related to trauma, such as an insect bite, cut from shaving, or inflamed acne-type bump.  Treatment options to remove include shave or excision with resulting scar and risk of recurrence.  Since benign-appearing and not bothersome, will observe for now.   EPIDERMAL INCLUSION CYST Exam: 3.0 mm firm flesh papule at left lateral eye canthus   Benign-appearing. Exam most consistent with an epidermal inclusion cyst. Discussed that a cyst is a benign growth that can grow over time and sometimes get irritated or inflamed. Recommend observation if it is not bothersome. Discussed option of surgical excision to remove it if it is growing, symptomatic, or other changes noted. Please call for new or changing lesions so they can be evaluated.  FAMILY HISTORY OF SKIN CANCER What type(s): BCC or SCC Who affected: father  LENTIGO/melanotic macule Exam: 4 x 2 mm gray/brown macule of the left lateral lower lip, stable compared to photo 07/28/22 Due to sun exposure   Treatment Plan: Benign-appearing, observe. Recommend daily broad spectrum sunscreen SPF 30+ to sun-exposed areas, reapply every 2 hours as needed.  Call for any  changes   BLUE NEVUS Exam: 2.5 mm gray/blue papule of the right forearm below elbow   Treatment Plan: Benign appearing on exam today. Recommend observation. Call clinic for new or changing moles. Recommend daily use of broad spectrum spf 30+ sunscreen to sun-exposed areas.   LICHEN SIMPLEX CHRONICUS Exam: Lichenified plaque at right ant ankle  Chronic and persistent condition with duration or expected duration over one year. Condition is improving with treatment but not currently at goal.   Lichen simplex chronicus [facility]) is a persistent itchy area of thickened skin that is induced by chronic rubbing and/or scratching (chronic dermatitis).  These areas may be pink, hyperpigmented and may have excoriations and bumps (prurigo nodules- PN).  LSC/PN is commonly observed in uncontrolled atopic dermatitis and other forms of eczema, and in other itchy skin conditions (eg, insect bites, scabies).  Sometimes it is not possible to know initial cause of LSC/PN if it has been present for a long time.  It generally responds well to treatment with high potency topical steroids.  It is important to stop rubbing/scratching the area in order to break the itch-scratch-rash-itch cycle, in order for the rash to resolve.   Treatment Plan: Continue clobetasol  cream once to twice daily to thicker more severe areas.  Avoid picking/rubbing/scratching  ATOPIC DERMATITIS vs NUMMULAR DERMATITIS Exam: Scaly pink patch with excoriations at left breast   Chronic and persistent condition with duration or expected duration over one year. Condition is bothersome/symptomatic for patient. Currently flared.   Atopic dermatitis (eczema) is a chronic, relapsing, pruritic condition that can significantly affect quality of life. It is often associated with allergic rhinitis and/or asthma and can require treatment with topical medications, phototherapy, or in severe cases biologic injectable medication (Dupixent; Adbry) or Oral JAK  inhibitors.   Treatment Plan: Start tacrolimus ointment twice daily until itchy rash improved dsp 60 g 2Rf. Patient to return if rash not clearing with topicals.   INFLAMED CYST Exam: Edematous pink nodule at crown scalp, pt states it is getting better  Benign-appearing. Exam most consistent with an epidermal inclusion cyst. Discussed that a cyst is a benign growth that can grow over time and sometimes get irritated or inflamed. Recommend observation if it is not bothersome. Discussed option of surgical excision to remove it if it is growing, symptomatic, or other changes noted. Please call for new or changing lesions so they can be evaluated.  If worsens, pt may need oral antibiotic.  Pt will call if not improving.   Return in about 1 year (around 08/03/2024) for TBSE.  IAndrea Kerns, CMA, am acting as scribe for Rexene Rattler, MD .   Documentation: I have reviewed the above documentation for accuracy and completeness, and I agree with the above.  Rexene Rattler, MD

## 2023-08-04 NOTE — Patient Instructions (Addendum)

## 2023-08-06 ENCOUNTER — Encounter: Payer: Self-pay | Admitting: Dermatology

## 2023-08-10 ENCOUNTER — Other Ambulatory Visit: Payer: Self-pay | Admitting: Family

## 2023-08-10 DIAGNOSIS — E782 Mixed hyperlipidemia: Secondary | ICD-10-CM

## 2023-08-25 ENCOUNTER — Telehealth: Admitting: Physician Assistant

## 2023-08-25 DIAGNOSIS — B9689 Other specified bacterial agents as the cause of diseases classified elsewhere: Secondary | ICD-10-CM

## 2023-08-25 DIAGNOSIS — J208 Acute bronchitis due to other specified organisms: Secondary | ICD-10-CM

## 2023-08-25 MED ORDER — BENZONATATE 100 MG PO CAPS: 100.0000 mg | ORAL_CAPSULE | Freq: Three times a day (TID) | ORAL | 0 refills | Status: DC | PRN

## 2023-08-25 MED ORDER — AZITHROMYCIN 250 MG PO TABS: ORAL_TABLET | ORAL | 0 refills | Status: AC

## 2023-08-25 NOTE — Progress Notes (Signed)
 I have spent 5 minutes in review of e-visit questionnaire, review and updating patient chart, medical decision making and response to patient.   Piedad Climes, PA-C

## 2023-08-25 NOTE — Progress Notes (Signed)

## 2023-08-27 ENCOUNTER — Ambulatory Visit
Admission: RE | Admit: 2023-08-27 | Discharge: 2023-08-27 | Disposition: A | Discharge status: Home / Self Care | Source: Ambulatory Visit | Attending: Family | Admitting: Family

## 2023-08-27 DIAGNOSIS — R600 Localized edema: Secondary | ICD-10-CM | POA: Insufficient documentation

## 2023-08-27 DIAGNOSIS — G4733 Obstructive sleep apnea (adult) (pediatric): Secondary | ICD-10-CM | POA: Diagnosis not present

## 2023-08-27 DIAGNOSIS — R0609 Other forms of dyspnea: Secondary | ICD-10-CM | POA: Diagnosis not present

## 2023-08-27 DIAGNOSIS — I517 Cardiomegaly: Secondary | ICD-10-CM | POA: Insufficient documentation

## 2023-08-27 DIAGNOSIS — E119 Type 2 diabetes mellitus without complications: Secondary | ICD-10-CM | POA: Diagnosis not present

## 2023-08-27 DIAGNOSIS — F319 Bipolar disorder, unspecified: Secondary | ICD-10-CM | POA: Insufficient documentation

## 2023-08-27 DIAGNOSIS — I5189 Other ill-defined heart diseases: Secondary | ICD-10-CM | POA: Insufficient documentation

## 2023-08-27 LAB — ECHOCARDIOGRAM COMPLETE
AR max vel: 2.72 cm2
AV Area VTI: 3.15 cm2
AV Area mean vel: 2.68 cm2
AV Mean grad: 4 mmHg
AV Peak grad: 7.7 mmHg
Ao pk vel: 1.39 m/s
Area-P 1/2: 3.01 cm2
MV VTI: 3.31 cm2
S' Lateral: 2.8 cm

## 2023-08-27 NOTE — Progress Notes (Signed)
*  PRELIMINARY RESULTS* Echocardiogram 2D Echocardiogram has been performed.  Floydene Harder 08/27/2023, 9:41 AM

## 2023-08-30 ENCOUNTER — Other Ambulatory Visit: Payer: Self-pay | Admitting: Family

## 2023-08-30 DIAGNOSIS — R12 Heartburn: Secondary | ICD-10-CM

## 2023-08-30 DIAGNOSIS — R0789 Other chest pain: Secondary | ICD-10-CM

## 2023-09-14 ENCOUNTER — Encounter: Payer: Self-pay | Admitting: Psychiatry

## 2023-09-14 ENCOUNTER — Telehealth: Admitting: Psychiatry

## 2023-09-14 DIAGNOSIS — F439 Reaction to severe stress, unspecified: Secondary | ICD-10-CM | POA: Diagnosis not present

## 2023-09-14 DIAGNOSIS — F4000 Agoraphobia, unspecified: Secondary | ICD-10-CM

## 2023-09-14 DIAGNOSIS — G4701 Insomnia due to medical condition: Secondary | ICD-10-CM

## 2023-09-14 DIAGNOSIS — F3342 Major depressive disorder, recurrent, in full remission: Secondary | ICD-10-CM | POA: Diagnosis not present

## 2023-09-14 DIAGNOSIS — F4010 Social phobia, unspecified: Secondary | ICD-10-CM | POA: Diagnosis not present

## 2023-09-14 MED ORDER — BUPROPION HCL 75 MG PO TABS: 75.0000 mg | ORAL_TABLET | Freq: Every morning | ORAL | 0 refills | Status: DC

## 2023-09-14 NOTE — Progress Notes (Signed)
 Virtual Visit via Video Note  I connected with Nicole Cervantes on 09/14/23 at  4:40 PM EDT by a video enabled telemedicine application and verified that I am speaking with the correct person using two identifiers.  Location Provider Location : [facility] Patient Location : Home  Participants: Patient , Provider   I discussed the limitations of evaluation and management by telemedicine and the availability of in person appointments. The patient expressed understanding and agreed to proceed.   I discussed the assessment and treatment plan with the patient. The patient was provided an opportunity to ask questions and all were answered. The patient agreed with the plan and demonstrated an understanding of the instructions.   The patient was advised to call back or seek an in-person evaluation if the symptoms worsen or if the condition fails to improve as anticipated.  BH MD OP Progress Note  09/14/2023 5:10 PM Nicole Cervantes  MRN:  [id number]  Chief Complaint:  Chief Complaint  Patient presents with   Follow-up   Anxiety   Medication Refill   Discussed the use of AI scribe software for clinical note transcription with the patient, who gave verbal consent to proceed.  History of Present Illness Nicole Cervantes is a 48 year old Caucasian female, married, employed, lives in [place], has a history of major depression, agoraphobia, social anxiety, hypertension, diabetes mellitus was evaluated by telemedicine today.  She reports that distressing memories of childhood trauma began after reading a book in the middle of last week. The book triggered recollections of being touched by an older friend while the friend's father watched, which she had previously blocked out. These memories resurfaced suddenly over the weekend and have been difficult for her to process. Initially, she struggled to cope but experienced some relief after discussing the memories with her husband, who provided emotional support.  She  has experienced significant sleep disturbance since the memories resurfaced, including difficulty falling and staying asleep, frequent awakenings, and nightmares that caused her to wake up every 30 minutes. She did not sleep at all the previous night, but after talking with her husband, she was able to sleep for 2 to 3 hours without nightmares. She remains unsure if her sleep will improve moving forward and prefers not to start a sleep medication at this time.  She is currently using CPAP for sleep apnea.  At work, she has experienced difficulty concentrating due to intrusive thoughts about the trauma, which led her to leave work early on the day of the visit. She has not been able to get the memories off her mind but feels somewhat better after disclosing them to her husband.  She denies any thoughts of hurting herself or others. She continues to participate in social activities, including hosting a Editor, commissioning, attending a birthday party, and participating in a book club. Aside from the recent incident, she reports sleeping well and engaging in her usual activities.  She confirms that she is currently taking sertraline  200 mg, Wellbutrin  75 mg in the morning, and ropinirole  for restless leg symptoms. She reports no recent changes to her medications.  She previously engaged in therapy with Ms. Darice Ronde, which she discontinued about a month ago due to work-related issues.  She agrees to reach out to her therapist to schedule another appointment.         Visit Diagnosis:    ICD-10-CM   1. Recurrent major depressive disorder, in full remission (HCC)  F33.42 buPROPion  (WELLBUTRIN ) 75 MG tablet  2. Agoraphobia  F40.00     3. Social anxiety disorder  F40.10     4. Trauma and stressor-related disorder  F43.9    unspecified , R/O PTSD    5. Insomnia due to medical condition  G47.01    Sleep apnea on CPAP, mood symptoms      Past Psychiatric History: I have reviewed past psychiatric  history from progress note on 11/28/2021.  Past trials of medications like Paxil , Lamictal , Wellbutrin , Seroquel.  Past Medical History:  Past Medical History:  Diagnosis Date   Allergy    Anxiety    Back pain    Bipolar 1 disorder (HCC)    Carpal tunnel syndrome    bilateral   Cervical spondylosis with myelopathy and radiculopathy    Depression    Essential hypertension    DR. GRANDIS - [facility]   Iron deficiency anemia    Major depressive disorder    Migraines    Mixed hyperlipidemia    Obstructive sleep apnea on CPAP    Polycystic ovarian syndrome    Type 2 diabetes mellitus with hyperglycemia, without long-term current use of insulin (HCC)    Vestibular migraine     Past Surgical History:  Procedure Laterality Date   ABDOMINAL HYSTERECTOMY  2007/06/15   IN [place], [place] - TOTAL EXCEPT OVARIES, LEIOMYOMA   ANTERIOR CERVICAL DECOMP/DISCECTOMY FUSION N/A 03/11/2023   Procedure: C4-6 ANTERIOR CERVICAL DISCECTOMY AND FUSION;  Surgeon: Clois Fret, MD;  Location: [facility] ORS;  Service: Neurosurgery;  Laterality: N/A;   COLONOSCOPY  2006   DX   COLONOSCOPY  2024   LAPAROSCOPY  2000   BURNED HOLES IN OVARIES TO RELEASE PRESSURE FROM CYSTS.  DR. BURNS, [place]   OVARIAN CYST REMOVAL     PILONIDAL CYST EXCISION     TONSILLECTOMY AND ADENOIDECTOMY  1978    Family Psychiatric History: I have reviewed family psychiatric history from progress note on 11/28/2021.  Family History:  Family History  Problem Relation Age of Onset   Diabetes Mother        passed away 14-Jun-2021   Hypertension Mother    Depression Mother    Stroke Mother    Throat cancer Mother    Alcohol abuse Father    Prostate cancer Father 36   Diabetes Father    Hypertension Father    Bipolar disorder Sister    Bladder Cancer Sister 82       in remission   Lupus Sister    Bipolar disorder Sister    Depression Maternal Aunt    Depression Maternal Uncle    Alcohol abuse Maternal Grandfather    Depression  Maternal Grandfather    Alcohol abuse Maternal Grandmother    Heart disease Maternal Grandmother    Depression Maternal Grandmother    Bipolar disorder Other     Social History: I have reviewed social history from progress note on 11/28/2021. Social History   Socioeconomic History   Marital status: Married    Spouse name: Nicole Cervantes   Number of children: 1   Years of education: 14   Highest education level: Associate degree: occupational, Scientist, product/process development, or vocational program  Occupational History    Employer: [employer]  Tobacco Use   Smoking status: Former    Current packs/day: 0.00    Types: Cigarettes    Start date: 1989    Quit date: 2009    Years since quitting: 16.6   Smokeless tobacco: Never  Vaping Use   Vaping status:  Never Used  Substance and Sexual Activity   Alcohol use: Yes    Comment: occasionally once a month   Drug use: No   Sexual activity: Yes    Birth control/protection: Surgical    Comment: Hysterectomy   Other Topics Concern   Not on file  Social History Narrative   Not on file   Social Drivers of Health   Financial Resource Strain: Medium Risk (07/03/2023)   Overall Financial Resource Strain (CARDIA)    Difficulty of Paying Living Expenses: Somewhat hard  Food Insecurity: No Food Insecurity (07/03/2023)   Hunger Vital Sign    Worried About Running Out of Food in the Last Year: Never true    Ran Out of Food in the Last Year: Never true  Transportation Needs: No Transportation Needs (07/03/2023)   PRAPARE - Administrator, Civil Service (Medical): No    Lack of Transportation (Non-Medical): No  Physical Activity: Unknown (07/03/2023)   Exercise Vital Sign    Days of Exercise per Week: 0 days    Minutes of Exercise per Session: Not on file  Stress: No Stress Concern Present (07/03/2023)   [employer] of Occupational Health - Occupational Stress Questionnaire    Feeling of Stress : Only a little  Social Connections: Moderately  Integrated (07/03/2023)   Social Connection and Isolation Panel    Frequency of Communication with Friends and Family: More than three times a week    Frequency of Social Gatherings with Friends and Family: Once a week    Attends Religious Services: Never    Database administrator or Organizations: Yes    Attends Engineer, structural: More than 4 times per year    Marital Status: Married    Allergies:  Allergies  Allergen Reactions   Ozempic  (0.25 Or 0.5 Mg-Dose) [Semaglutide (0.25 Or 0.5mg -Dos)] Other (See Comments)    heartburn   Mounjaro  [Tirzepatide ] Rash    Metabolic Disorder Labs: Lab Results  Component Value Date   HGBA1C 6.4 (H) 03/04/2023   MPG 136.98 03/04/2023   MPG 117 10/25/2015   No results found for: PROLACTIN Lab Results  Component Value Date   CHOL 180 07/09/2023   TRIG 169.0 (H) 07/09/2023   HDL 48.30 07/09/2023   CHOLHDL 4 07/09/2023   VLDL 33.8 07/09/2023   LDLCALC 98 07/09/2023   LDLCALC 63 05/23/2022   Lab Results  Component Value Date   TSH 2.00 07/09/2023   TSH 2.19 11/08/2021    Therapeutic Level Labs: No results found for: LITHIUM No results found for: VALPROATE No results found for: CBMZ  Current Medications: Current Outpatient Medications  Medication Sig Dispense Refill   ACCU-CHEK GUIDE test strip USE TO CHECK BLOOD SUGAR UP TO 4 TIMES DAILY AS DIRECTED     Accu-Chek Softclix Lancets lancets SMARTSIG:Topical 1-4 Times Daily     acetaminophen  (TYLENOL ) 500 MG tablet Take 500-1,000 mg by mouth every 6 (six) hours as needed (pain.).     amLODipine  (NORVASC ) 10 MG tablet Take 1 tablet by mouth once daily 90 tablet 3   atorvastatin  (LIPITOR) 10 MG tablet Take 1 tablet by mouth once daily 90 tablet 0   benzonatate  (TESSALON ) 100 MG capsule Take 1 capsule (100 mg total) by mouth 3 (three) times daily as needed for cough. 30 capsule 0   blood glucose meter kit and supplies Dispense based on patient and insurance preference.  Use up to four times daily as directed. (FOR ICD-10 E10.9, E11.9). 1 each  0   buPROPion  (WELLBUTRIN ) 75 MG tablet Take 1 tablet (75 mg total) by mouth every morning. 90 tablet 0   clobetasol  cream (TEMOVATE ) 0.05 % Apply 1 Application topically 2 (two) times daily. (Patient taking differently: Apply 1 Application topically 2 (two) times daily as needed (skin irritation.).) 30 g 11   clotrimazole -betamethasone  (LOTRISONE ) cream Apply externally BID prn sx up to 2 wks (Patient taking differently: Apply 1 Application topically 2 (two) times daily as needed (skin irritation.).) 15 g 1   losartan  (COZAAR ) 50 MG tablet Take 1 tablet (50 mg total) by mouth daily.     metFORMIN  (GLUCOPHAGE ) 500 MG tablet Take 1 tablet (500 mg total) by mouth 2 (two) times daily with a meal. 180 tablet 1   methocarbamol  (ROBAXIN ) 500 MG tablet Take 1 tablet (500 mg total) by mouth 4 (four) times daily. 120 tablet 0   pantoprazole  (PROTONIX ) 20 MG tablet Take 1 tablet by mouth once daily 30 tablet 2   rOPINIRole  (REQUIP ) 0.25 MG tablet Take 1 tablet (0.25 mg total) by mouth at bedtime. 90 tablet 3   sertraline  (ZOLOFT ) 100 MG tablet Take 2 tablets by mouth once daily 180 tablet 3   tacrolimus  (PROTOPIC ) 0.1 % ointment APPLY 2 grams TO AFFECTED AREAS OF RASH TWICE DAILY UNTIL IMPROVED 60 g 2   No current facility-administered medications for this visit.     Musculoskeletal: Strength & Muscle Tone: UTA Gait & Station: Seated Patient leans: N/A  Psychiatric Specialty Exam: Review of Systems  Psychiatric/Behavioral:  Positive for sleep disturbance. The patient is nervous/anxious.     There were no vitals taken for this visit.There is no height or weight on file to calculate BMI.  General Appearance: Casual  Eye Contact:  Fair  Speech:  Clear and Coherent  Volume:  Normal  Mood:  Anxious  Affect:  Congruent  Thought Process:  Goal Directed and Descriptions of Associations: Intact  Orientation:  Full (Time, Place,  and Person)  Thought Content: Logical   Suicidal Thoughts:  No  Homicidal Thoughts:  No  Memory:  Immediate;   Fair Recent;   Fair Remote;   Fair  Judgement:  Fair  Insight:  Fair  Psychomotor Activity:  Normal  Concentration:  Concentration: Fair and Attention Span: Fair  Recall:  [employer] of Knowledge: Fair  Language: Fair  Akathisia:  No  Handed:  Right  AIMS (if indicated): not done  Assets:  Communication Skills Desire for Improvement Housing Talents/Skills Transportation  ADL's:  Intact  Cognition: WNL  Sleep:  Varies   Screenings: AUDIT    Garment/textile technologist Visit from 07/07/2023 in [facility] Visit from 10/16/2022 in [facility] Admission (Discharged) from 10/24/2015 in [facility] INPATIENT BE[facility]AL MEDICINE  Alcohol Use Disorder Identification Test Final Score (AUDIT) 3  4  0   GAD-7    Flowsheet Row Office Visit from 07/07/2023 in [facility] [facility] Visit from 05/11/2023 in [facility] Office Visit from 10/16/2022 in [facility] Visit from 05/15/2022 in [facility] Visit from 03/19/2022 in [facility]  Total GAD-7 Score 1 0 2 0 0   PHQ2-9    Flowsheet Row Office Visit from 07/07/2023 in [facility] Visit from 05/11/2023 in [facility] Video Visit from 02/09/2023 in [facility] Office Visit from 10/16/2022 in [facility] Video Visit from 10/07/2022 in [facility]  PHQ-2 Total Score 0 0 0 0 0  PHQ-9 Total Score 3 -- -- 6 --   Flowsheet Row Video Visit from 09/14/2023 in [facility] Office Visit from 05/11/2023 in [facility] Admission (Discharged) from 03/11/2023 in [facility]  C-SSRS RISK CATEGORY Moderate Risk No Risk No Risk     Assessment and Plan: Nicole Cervantes is a 48 year old Caucasian female, was evaluated by telemedicine today.  Discussed assessment and plan as noted below.  MDD in remission Currently denies any significant sadness or anhedonia or hopelessness. Continue Sertraline  200 mg daily Continue Wellbutrin  75 mg daily  Agoraphobia/Social anxiety disorder-stable Currently reports social anxiety and agoraphobia is manageable. Continue current medication regimen.  Insomnia/Trauma and stress related disorder unspecified rule out PTSD-unstable Recent triggers caused worsening of trauma related symptoms which has affected sleep as well. Declines initiation of medication like Prazosin/Trazodone . Agreeable to restart psychotherapy/trauma focused therapy.  She will reach out to her previous therapist. Continue Requip  as prescribed by primary care provider for restless leg symptoms. Continue CPAP therapy.  Follow-up Follow-up in clinic in 1 month or sooner if needed.   Collaboration of Care: Collaboration of Care: Referral or follow-up with counselor/therapist AEB encouraged to reestablish care with therapist and start trauma focused therapy.  Patient/Guardian was advised Release of Information must be obtained prior to any record release in order to collaborate their care with an outside provider. Patient/Guardian was advised if they have not already done so to contact the registration department to sign all necessary forms in order for us  to release information regarding their care.   Consent: Patient/Guardian gives verbal consent for treatment and assignment of benefits for services provided during this visit. Patient/Guardian expressed understanding and agreed to  proceed.   This note was generated in part or whole with voice recognition software. Voice recognition is usually quite accurate but there are transcription errors that can and very often do occur. I apologize for any typographical errors that were not detected and corrected.    Emiliana Blaize, MD 09/15/2023, 1:07 PM

## 2023-09-14 NOTE — Patient Instructions (Signed)
 Trazodone  Tablets What is this medication? TRAZODONE  (TRAZ oh done) treats depression. It increases the amount of serotonin in the brain, a hormone that helps regulate mood. This medicine may be used for other purposes; ask your health care provider or pharmacist if you have questions. COMMON BRAND NAME(S): Desyrel  What should I tell my care team before I take this medication? They need to know if you have any of these conditions: Bipolar disorder Bleeding disorder Glaucoma Heart disease, or previous heart attack Irregular heartbeat or rhythm Kidney disease Liver disease Low levels of sodium in the blood Suicidal thoughts, plans, or attempt by you or a family member An unusual or allergic reaction to trazodone , other medications, foods, dyes, or preservatives Pregnant or trying to get pregnant Breastfeeding How should I use this medication? Take this medication by mouth with a glass of water. Take it as directed on the prescription label at the same time every day. Take this medication shortly after a meal or a light snack. Keep taking this medication unless your care team tells you to stop. Stopping it too quickly can cause serious side effects. It can also make your condition worse. A special MedGuide will be given to you by the pharmacist with each prescription and refill. Be sure to read this information carefully each time. Talk to your care team about the use of this medication in children. Special care may be needed. Overdosage: If you think you have taken too much of this medicine contact a poison control center or emergency room at once. NOTE: This medicine is only for you. Do not share this medicine with others. What if I miss a dose? If you miss a dose, take it as soon as you can. If it is almost time for your next dose, take only that dose. Do not take double or extra doses. What may interact with this medication? Do not take this medication with any of the following: Certain  medications for fungal infections, such as fluconazole, itraconazole, ketoconazole, posaconazole, voriconazole Cisapride Dronedarone Linezolid MAOIs, such as Carbex, Eldepryl, Marplan, Nardil, and Parnate Mesoridazine Methylene blue (injected into a vein) Pimozide Saquinavir Thioridazine This medication may also interact with the following: Alcohol Antiviral medications for HIV or AIDS Aspirin and aspirin-like medications Barbiturates, such as phenobarbital Certain medications for blood pressure, heart disease, irregular heart beat Certain medications for mental health conditions Certain medications for migraine headache, such as almotriptan, eletriptan, frovatriptan, naratriptan, rizatriptan, sumatriptan, zolmitriptan Certain medications for seizures, such as carbamazepine and phenytoin Certain medications for sleep Certain medications that treat or prevent blood clots, such as dalteparin, enoxaparin, warfarin Digoxin Fentanyl  Lithium NSAIDS, medications for pain and inflammation, such as ibuprofen or naproxen  Other medications that cause heart rhythm changes Rasagiline Supplements, such as St. John's wort, kava kava, valerian Tramadol  Tryptophan This list may not describe all possible interactions. Give your health care provider a list of all the medicines, herbs, non-prescription drugs, or dietary supplements you use. Also tell them if you smoke, drink alcohol, or use illegal drugs. Some items may interact with your medicine. What should I watch for while using this medication? Visit your care team for regular checks on your progress. Tell your care team if your symptoms do not start to get better or if they get worse. Because it may take several weeks to see the full effects of this medication, it is important to continue your treatment as prescribed by your care team. Watch for new or worsening thoughts of suicide or depression. This  includes sudden changes in mood, behaviors,  or thoughts. These changes can happen at any time but are more common in the beginning of treatment or after a change in dose. Call your care team right away if you experience these thoughts or worsening depression. This medication may cause mood and behavior changes, such as anxiety, nervousness, irritability, hostility, restlessness, excitability, hyperactivity, or trouble sleeping. These changes can happen at any time but are more common in the beginning of treatment or after a change in dose. Call your care team right away if you notice any of these symptoms. This medication may affect your coordination, reaction time, or judgment. Do not drive or operate machinery until you know how this medication affects you. Sit up or stand slowly to reduce the risk of dizzy or fainting spells. Drinking alcohol with this medication can increase the risk of these side effects. This medication may cause dry eyes and blurred vision. If you wear contact lenses you may feel some discomfort. Lubricating drops may help. See your care team if the problem does not go away or is severe. Your mouth may get dry. Chewing sugarless gum or sucking hard candy and drinking plenty of water may help. Contact your care team if the problem does not go away or is severe. What side effects may I notice from receiving this medication? Side effects that you should report to your care team as soon as possible: Allergic reactions--skin rash, itching, hives, swelling of the face, lips, tongue, or throat Bleeding--bloody or black, tar-like stools, red or dark brown urine, vomiting blood or brown material that looks like coffee grounds, small, red or purple spots on skin, unusual bleeding or bruising Heart rhythm changes--fast or irregular heartbeat, dizziness, feeling faint or lightheaded, chest pain, trouble breathing Low blood pressure--dizziness, feeling faint or lightheaded, blurry vision Low sodium level--muscle weakness, fatigue,  dizziness, headache, confusion Prolonged or painful erection Serotonin syndrome--irritability, confusion, fast or irregular heartbeat, muscle stiffness, twitching muscles, sweating, high fever, seizures, chills, vomiting, diarrhea Sudden eye pain or change in vision such as blurry vision, seeing halos around lights, vision loss Thoughts of suicide or self-harm, worsening mood, feelings of depression Side effects that usually do not require medical attention (report to your care team if they continue or are bothersome): Change in sex drive or performance Constipation Dizziness Drowsiness Dry mouth This list may not describe all possible side effects. Call your doctor for medical advice about side effects. You may report side effects to FDA at 1-800-FDA-1088. Where should I keep my medication? Keep out of the reach of children and pets. Store at room temperature between 15 and 30 degrees C (59 to 86 degrees F). Protect from light. Keep container tightly closed. Throw away any unused medication after the expiration date. NOTE: This sheet is a summary. It may not cover all possible information. If you have questions about this medicine, talk to your doctor, pharmacist, or health care provider.  2024 Elsevier/Gold Standard (2022-01-16 00:00:00)Prazosin Capsules What is this medication? PRAZOSIN (PRA zoe sin) treats high blood pressure. It works by relaxing blood vessels, which decreases the amount of work the heart has to do. It belongs to a group of medications called alpha blockers. This medicine may be used for other purposes; ask your health care provider or pharmacist if you have questions. COMMON BRAND NAME(S): Minipress What should I tell my care team before I take this medication? They need to know if you have any of the following conditions: Kidney disease An  unusual or allergic reaction to prazosin, other medications, foods, dyes, or preservatives Pregnant or trying to get  pregnant Breast-feeding How should I use this medication? Take this medication by mouth. Take it as directed on the prescription label at the same time every day. Keep taking it unless your care team tells you to stop. Talk to your care team about the use of this medication in children. Special care may be needed. Overdosage: If you think you have taken too much of this medicine contact a poison control center or emergency room at once. NOTE: This medicine is only for you. Do not share this medicine with others. What if I miss a dose? If you miss a dose, take it as soon as you can. If it is almost time for your next dose, take only that dose. Do not take double or extra doses. What may interact with this medication? This medication may interact with the following: Diuretics Medications for high blood pressure Sildenafil Tadalafil Vardenafil This list may not describe all possible interactions. Give your health care provider a list of all the medicines, herbs, non-prescription drugs, or dietary supplements you use. Also tell them if you smoke, drink alcohol, or use illegal drugs. Some items may interact with your medicine. What should I watch for while using this medication? Visit your care team for regular checks on your progress. Check your blood pressure regularly. Ask your care team what your blood pressure should be and when you should contact them. Drowsiness and dizziness are more likely to occur after the first dose, after an increase in dose, or during hot weather or exercise. These effects can decrease once your body adjusts to this medication. Do not drive, use machinery, or do anything that needs mental alertness until you know how this medication affects you. Do not stand or sit up quickly, especially if you are an older patient. This reduces the risk of dizzy or fainting spells. Alcohol can make you more drowsy and dizzy. Avoid alcoholic drinks. Do not treat yourself for coughs,  colds or allergies without asking your care team for advice. Some ingredients can increase your blood pressure. Your mouth may get dry. Chewing sugarless gum or sucking hard candy, and drinking plenty of water may help. Contact your care team if the problem does not go away or is severe. For males, contact your care team right away if you have an erection that lasts longer than 4 hours or if it becomes painful. This may be a sign of a serious problem and must be treated right away to prevent permanent damage. What side effects may I notice from receiving this medication? Side effects that you should report to your care team as soon as possible: Allergic reactions--skin rash, itching, hives, swelling of the face, lips, tongue, or throat Low blood pressure--dizziness, feeling faint or lightheaded, blurry vision Prolonged or painful erection Side effects that usually do not require medical attention (report to your care team if they continue or are bothersome): Blurry vision Drowsiness Fatigue Headache Heart palpitations--rapid, pounding, or irregular heartbeat Nausea Swelling of the ankles, hands, or feet This list may not describe all possible side effects. Call your doctor for medical advice about side effects. You may report side effects to FDA at 1-800-FDA-1088. Where should I keep my medication? Keep out of the reach of children and pets. Store at room temperature between 15 and 30 degrees C (59 and 86 degrees F). Throw away any unused medication after the expiration date. NOTE:  This sheet is a summary. It may not cover all possible information. If you have questions about this medicine, talk to your doctor, pharmacist, or health care provider.  2024 Elsevier/Gold Standard (2020-12-31 00:00:00)

## 2023-09-22 ENCOUNTER — Encounter: Payer: Self-pay | Admitting: Family

## 2023-09-22 ENCOUNTER — Encounter: Payer: Self-pay | Admitting: Cardiovascular Disease

## 2023-09-22 ENCOUNTER — Ambulatory Visit: Attending: Cardiovascular Disease | Admitting: Cardiovascular Disease

## 2023-09-22 VITALS — BP 112/70 | HR 67 | Resp 98 | Ht 64.0 in | Wt 246.4 lb

## 2023-09-22 DIAGNOSIS — E66813 Obesity, class 3: Secondary | ICD-10-CM

## 2023-09-22 DIAGNOSIS — I10 Essential (primary) hypertension: Secondary | ICD-10-CM

## 2023-09-22 DIAGNOSIS — I517 Cardiomegaly: Secondary | ICD-10-CM

## 2023-09-22 DIAGNOSIS — Z6841 Body Mass Index (BMI) 40.0 and over, adult: Secondary | ICD-10-CM

## 2023-09-22 DIAGNOSIS — Z79899 Other long term (current) drug therapy: Secondary | ICD-10-CM

## 2023-09-22 DIAGNOSIS — R072 Precordial pain: Secondary | ICD-10-CM

## 2023-09-22 DIAGNOSIS — R0789 Other chest pain: Secondary | ICD-10-CM

## 2023-09-22 DIAGNOSIS — F331 Major depressive disorder, recurrent, moderate: Secondary | ICD-10-CM | POA: Diagnosis not present

## 2023-09-22 DIAGNOSIS — E119 Type 2 diabetes mellitus without complications: Secondary | ICD-10-CM

## 2023-09-22 DIAGNOSIS — I209 Angina pectoris, unspecified: Secondary | ICD-10-CM

## 2023-09-22 DIAGNOSIS — E782 Mixed hyperlipidemia: Secondary | ICD-10-CM

## 2023-09-22 MED ORDER — METOPROLOL TARTRATE 100 MG PO TABS: 100.0000 mg | ORAL_TABLET | Freq: Once | ORAL | 0 refills | Status: AC

## 2023-09-22 NOTE — Telephone Encounter (Signed)
 Yes please schedule No recent documentation to support

## 2023-09-22 NOTE — Patient Instructions (Addendum)
 Medication Instructions:   No changes  Metoprolol  Tartrate 100 MG once two hours prior to Cardiac CT.   If you need a refill on your cardiac medications before your next appointment, please call your pharmacy.   Lab work: Your provider would like for you to have following labs drawn today BMP.    Testing/Procedures:   Your cardiac CT will be scheduled at one of the below locations:   [facility] [address] [place], [place] [address] [phone]  Please follow these instructions carefully (unless otherwise directed):  An IV will be required for this test and Nitroglycerin will be given.    On the Night Before the Test: Be sure to Drink plenty of water. Do not consume any caffeinated/decaffeinated beverages or chocolate 12 hours prior to your test. Do not take any antihistamines 12 hours prior to your test.   On the Day of the Test: Drink plenty of water until 1 hour prior to the test. Do not eat any food 1 hour prior to test. You may take your regular medications prior to the test.  Take metoprolol  (Lopressor ) 100 MG two hours prior to test. If you take Furosemide/Hydrochlorothiazide /Spironolactone/Chlorthalidone, please HOLD on the morning of the test. Patients who wear a continuous glucose monitor MUST remove the device prior to scanning. FEMALES- please wear underwire-free bra if available, avoid dresses & tight clothing      After the Test: Drink plenty of water. After receiving IV contrast, you may experience a mild flushed feeling. This is normal. On occasion, you may experience a mild rash up to 24 hours after the test. This is not dangerous. If this occurs, you can take Benadryl 25 mg, Zyrtec, Claritin, or Allegra and increase your fluid intake. (Patients taking Tikosyn should avoid Benadryl, and may take Zyrtec, Claritin, or Allegra) If you experience trouble breathing, this can be serious. If it is severe call 911 IMMEDIATELY. If it  is mild, please call our office.  We will call to schedule your test 2-4 weeks out understanding that some insurance companies will need an authorization prior to the service being performed.   For more information and frequently asked questions, please visit our website : [email or web address]  For non-scheduling related questions, please contact the cardiac imaging nurse navigator should you have any questions/concerns: Cardiac Imaging Nurse Navigators Direct Office Dial: [phone]   For scheduling needs, including cancellations and rescheduling, please call [place], [phone].   Follow-Up: At [facility], you and your health needs are our priority.  As part of our continuing mission to provide you with exceptional heart care, we have created designated Provider Care Teams.  These Care Teams include your primary Cardiologist (physician) and Advanced Practice Providers (APPs -  Physician Assistants and Nurse Practitioners) who all work together to provide you with the care you need, when you need it.  You will need a follow up appointment as needed  Providers on your designated Care Team:   Lonni Meager, NP Bernardino Bring, PA-C Cadence [place], [place]  COVID-19 Vaccine Information can be found at: [email or web address] For questions related to vaccine distribution or appointments, please email [email or web address] or call [phone].

## 2023-09-22 NOTE — Progress Notes (Signed)
 Cardiology Office Note  Date:  09/22/2023   ID:  Nicole Cervantes, DOB 02-17-1975, MRN [id number]  PCP:  Corwin Antu, FNP   Chief Complaint  Patient presents with   New Patient (Initial Visit)    Ref by Antu Corwin, FNP for cardiomegaly, HTN and chest discomfort. Patient c/o LE edema, chest discomfort/stabbing pains that come and goes and has shortness of breath with little activity.     HPI:  Nicole Gilcrest Torainis a 48 y.o. femalewith past medical history of: Hypertension Bipolar/anxiety Morbid obesity Diabetes Smoker quit 2009 Who presents by referral from Tabitha Dugal for consultation of her cardiomegaly, hypertension, chest discomfort  Was seen in urgent care for cough X-ray with concern for cardiomegaly Shortness of breath at times  Chronic > 4 yrs of left chest pain Sharp quick pain  Also periodically with dull pain lasting 1 to several hours,  rubs on chest to make it go away, Typically presents 2-3 times a month Takes tylenol  at times  Prior neck surgery 2/25, anterior approach  Echocardiogram performed in July 2025 Normal LV size and function Normal RV size and function No significant valvular heart disease  Famly hx Grandfather died 21 MI Uncles with CAD  EKG personally reviewed by myself on todays visit EKG Interpretation Date/Time:  Tuesday September 22 2023 11:36:59 EDT Ventricular Rate:  67 PR Interval:  176 QRS Duration:  104 QT Interval:  420 QTC Calculation: 443 R Axis:   -18  Text Interpretation: Normal sinus rhythm Normal ECG When compared with ECG of 22-Sep-2023 11:34, No significant change was found Confirmed by Perla Lye [phone]) on 09/22/2023 12:56:30 PM    PMH:   has a past medical history of Allergy, Anxiety, Back pain, Bipolar 1 disorder (HCC), Carpal tunnel syndrome, Cervical spondylosis with myelopathy and radiculopathy, Depression, Essential hypertension, Iron deficiency anemia, Major depressive disorder, Migraines, Mixed hyperlipidemia,  Obstructive sleep apnea on CPAP, Polycystic ovarian syndrome, Type 2 diabetes mellitus with hyperglycemia, without long-term current use of insulin (HCC), and Vestibular migraine.  PSH:    Past Surgical History:  Procedure Laterality Date   ABDOMINAL HYSTERECTOMY  05/2007   IN [place], [place] - TOTAL EXCEPT OVARIES, LEIOMYOMA   ANTERIOR CERVICAL DECOMP/DISCECTOMY FUSION N/A 03/11/2023   Procedure: C4-6 ANTERIOR CERVICAL DISCECTOMY AND FUSION;  Surgeon: Clois Fret, MD;  Location: [facility] ORS;  Service: Neurosurgery;  Laterality: N/A;   COLONOSCOPY  2006   DX   COLONOSCOPY  2024   LAPAROSCOPY  2000   BURNED HOLES IN OVARIES TO RELEASE PRESSURE FROM CYSTS.  DR. BURNS, [place]   OVARIAN CYST REMOVAL     PILONIDAL CYST EXCISION     TONSILLECTOMY AND ADENOIDECTOMY  1978    Current Outpatient Medications  Medication Sig Dispense Refill   ACCU-CHEK GUIDE test strip USE TO CHECK BLOOD SUGAR UP TO 4 TIMES DAILY AS DIRECTED     Accu-Chek Softclix Lancets lancets SMARTSIG:Topical 1-4 Times Daily     acetaminophen  (TYLENOL ) 500 MG tablet Take 500-1,000 mg by mouth every 6 (six) hours as needed (pain.).     amLODipine  (NORVASC ) 10 MG tablet Take 1 tablet by mouth once daily 90 tablet 3   atorvastatin  (LIPITOR) 10 MG tablet Take 1 tablet by mouth once daily 90 tablet 0   benzonatate  (TESSALON ) 100 MG capsule Take 1 capsule (100 mg total) by mouth 3 (three) times daily as needed for cough. 30 capsule 0   blood glucose meter kit and supplies Dispense based on patient and insurance preference.  Use up to four times daily as directed. (FOR ICD-10 E10.9, E11.9). 1 each 0   buPROPion  (WELLBUTRIN ) 75 MG tablet Take 1 tablet (75 mg total) by mouth every morning. 90 tablet 0   clobetasol  cream (TEMOVATE ) 0.05 % Apply 1 Application topically 2 (two) times daily. 30 g 11   clotrimazole -betamethasone  (LOTRISONE ) cream Apply externally BID prn sx up to 2 wks 15 g 1   losartan  (COZAAR ) 50 MG tablet Take 1 tablet (50 mg  total) by mouth daily.     metFORMIN  (GLUCOPHAGE ) 500 MG tablet Take 1 tablet (500 mg total) by mouth 2 (two) times daily with a meal. 180 tablet 1   methocarbamol  (ROBAXIN ) 500 MG tablet Take 1 tablet (500 mg total) by mouth 4 (four) times daily. 120 tablet 0   pantoprazole  (PROTONIX ) 20 MG tablet Take 1 tablet by mouth once daily 30 tablet 2   rOPINIRole  (REQUIP ) 0.25 MG tablet Take 1 tablet (0.25 mg total) by mouth at bedtime. 90 tablet 3   sertraline  (ZOLOFT ) 100 MG tablet Take 2 tablets by mouth once daily 180 tablet 3   tacrolimus  (PROTOPIC ) 0.1 % ointment APPLY 2 grams TO AFFECTED AREAS OF RASH TWICE DAILY UNTIL IMPROVED 60 g 2   No current facility-administered medications for this visit.     Allergies:   Ozempic  (0.25 or 0.5 mg-dose) [semaglutide (0.25 or 0.5mg -dos)] and Mounjaro  [tirzepatide ]   Social History:  The patient  reports that she quit smoking about 16 years ago. Her smoking use included cigarettes. She started smoking about 36 years ago. She has never used smokeless tobacco. She reports current alcohol use. She reports that she does not use drugs.   Family History:   family history includes Alcohol abuse in her father, maternal grandfather, and maternal grandmother; Bipolar disorder in her sister, sister and another family member; Bladder Cancer (age of onset: 13) in her sister; Depression in her maternal aunt, maternal grandfather, maternal grandmother, maternal uncle, and mother; Diabetes in her father and mother; Heart disease in her maternal grandmother and paternal uncle; Hypertension in her father and mother; Lupus in her sister; Prostate cancer (age of onset: 9) in her father; Stroke in her mother; Throat cancer in her mother.    Review of Systems: Review of Systems  Constitutional: Negative.   HENT: Negative.    Respiratory: Negative.    Cardiovascular:  Positive for chest pain.  Gastrointestinal: Negative.   Musculoskeletal: Negative.   Neurological: Negative.    Psychiatric/Behavioral: Negative.    All other systems reviewed and are negative.   PHYSICAL EXAM: VS:  BP 112/70 (BP Location: Right Arm, Patient Position: Sitting, Cuff Size: Large)   Pulse 67   Resp (!) 98   Ht 5' 4 (1.626 m)   Wt 246 lb 6 oz (111.8 kg)   SpO2 98%   BMI 42.29 kg/m  , BMI Body mass index is 42.29 kg/m. GEN: Well nourished, well developed, in no acute distress HEENT: normal Neck: no JVD, carotid bruits, or masses Cardiac: RRR; no murmurs, rubs, or gallops,no edema  Respiratory:  clear to auscultation bilaterally, normal work of breathing GI: soft, nontender, nondistended, + BS MS: no deformity or atrophy Skin: warm and dry, no rash Neuro:  Strength and sensation are intact Psych: euthymic mood, full affect   Recent Labs: 07/09/2023: ALT 19; BUN 14; Creatinine, Ser 0.65; Hemoglobin 13.9; Platelets 241.0; Potassium 4.0; Sodium 141; TSH 2.00    Lipid Panel Lab Results  Component Value Date   CHOL  180 07/09/2023   HDL 48.30 07/09/2023   LDLCALC 98 07/09/2023   TRIG 169.0 (H) 07/09/2023      Wt Readings from Last 3 Encounters:  09/22/23 246 lb 6 oz (111.8 kg)  07/07/23 235 lb (106.6 kg)  05/28/23 247 lb (112 kg)     ASSESSMENT AND PLAN:  Problem List Items Addressed This Visit       Cardiology Problems   Hypertension   Relevant Orders   EKG 12-Lead   Cardiomegaly   Relevant Orders   EKG 12-Lead   Mixed hyperlipidemia     Other   Diabetes mellitus without complication (HCC)   Chest discomfort - Primary   Relevant Orders   EKG 12-Lead   Class 3 severe obesity with serious comorbidity and body mass index (BMI) of 40.0 to 44.9 in adult   MDD (major depressive disorder), recurrent episode, moderate (HCC)   Chest pain/angina Long history of pain, describes a sharp shooting pain lasting for less than a minute or so likely atypical and musculoskeletal Also has a second type of dull left-sided chest pain lasting for hours at a time of  unclear etiology, unable to exclude angina We have recommended further evaluation given cardiac risk factors including diabetes, hyperlipidemia Unable to treadmill We have recommended cardiac CTA  Hyperlipidemia Total cholesterol 180, started recently on Lipitor 10 daily Goal LDL less than 70  Essential hypertension Blood pressure is well controlled on today's visit. No changes made to the medications.  Diabetes type 2 A1c running 6.4, managed by primary care We have encouraged continued exercise, careful diet management in an effort to lose weight.  Mr. Puleo was seen in consultation for [facility] and will be referred back to her office for ongoing care of the issues detailed above   Signed, Velinda Lunger, M.D., Ph.D. [facility] [place], [place] [phone]

## 2023-09-23 LAB — BASIC METABOLIC PANEL WITH GFR
BUN/Creatinine Ratio: 16 (ref 9–23)
BUN: 11 mg/dL (ref 6–24)
CO2: 22 mmol/L (ref 20–29)
Calcium: 9.9 mg/dL (ref 8.7–10.2)
Chloride: 103 mmol/L (ref 96–106)
Creatinine, Ser: 0.69 mg/dL (ref 0.57–1.00)
Glucose: 93 mg/dL (ref 70–99)
Potassium: 4 mmol/L (ref 3.5–5.2)
Sodium: 142 mmol/L (ref 134–144)
eGFR: 106 mL/min/1.73 (ref >59)

## 2023-09-25 ENCOUNTER — Ambulatory Visit: Payer: Self-pay | Admitting: Cardiovascular Disease

## 2023-09-29 ENCOUNTER — Other Ambulatory Visit: Payer: Self-pay | Admitting: Family

## 2023-09-29 ENCOUNTER — Encounter (INDEPENDENT_AMBULATORY_CARE_PROVIDER_SITE_OTHER): Payer: Self-pay

## 2023-09-29 DIAGNOSIS — G4701 Insomnia due to medical condition: Secondary | ICD-10-CM

## 2023-09-29 DIAGNOSIS — G2581 Restless legs syndrome: Secondary | ICD-10-CM

## 2023-09-30 MED ORDER — TRAZODONE HCL 50 MG PO TABS: 25.0000 mg | ORAL_TABLET | Freq: Every evening | ORAL | 0 refills | Status: DC | PRN

## 2023-09-30 NOTE — Telephone Encounter (Signed)
 Patient contacted regarding sleep problems.  Discussed medications like trazodone  and prazosin the patient.  Patient provided instructions/medication education.  Patient decided to go along with trazodone .  I have sent trazodone  to pharmacy as requested.   I have spent at least 5 minutes non face to face with patient today.

## 2023-10-07 ENCOUNTER — Other Ambulatory Visit: Payer: Self-pay | Admitting: Family

## 2023-10-07 DIAGNOSIS — I10 Essential (primary) hypertension: Secondary | ICD-10-CM

## 2023-10-15 ENCOUNTER — Encounter (HOSPITAL_COMMUNITY): Payer: Self-pay

## 2023-10-16 ENCOUNTER — Encounter: Payer: Self-pay | Admitting: Psychiatry

## 2023-10-16 ENCOUNTER — Telehealth (INDEPENDENT_AMBULATORY_CARE_PROVIDER_SITE_OTHER): Admitting: Psychiatry

## 2023-10-16 DIAGNOSIS — F4000 Agoraphobia, unspecified: Secondary | ICD-10-CM

## 2023-10-16 DIAGNOSIS — F439 Reaction to severe stress, unspecified: Secondary | ICD-10-CM

## 2023-10-16 DIAGNOSIS — F4010 Social phobia, unspecified: Secondary | ICD-10-CM | POA: Diagnosis not present

## 2023-10-16 DIAGNOSIS — F3342 Major depressive disorder, recurrent, in full remission: Secondary | ICD-10-CM | POA: Diagnosis not present

## 2023-10-16 DIAGNOSIS — G4733 Obstructive sleep apnea (adult) (pediatric): Secondary | ICD-10-CM

## 2023-10-16 DIAGNOSIS — G4701 Insomnia due to medical condition: Secondary | ICD-10-CM

## 2023-10-16 MED ORDER — TRAZODONE HCL 50 MG PO TABS: 25.0000 mg | ORAL_TABLET | Freq: Every evening | ORAL | 0 refills | Status: DC | PRN

## 2023-10-16 NOTE — Progress Notes (Signed)
 Virtual Visit via Video Note  I connected with Nicole Cervantes on 10/16/23 at 10:20 AM EDT by a video enabled telemedicine application and verified that I am speaking with the correct person using two identifiers.  Location Provider Location : [facility] Patient Location : Work  Participants: Patient , Provider    I discussed the limitations of evaluation and management by telemedicine and the availability of in person appointments. The patient expressed understanding and agreed to proceed.   I discussed the assessment and treatment plan with the patient. The patient was provided an opportunity to ask questions and all were answered. The patient agreed with the plan and demonstrated an understanding of the instructions.   The patient was advised to call back or seek an in-person evaluation if the symptoms worsen or if the condition fails to improve as anticipated.    BH MD OP Progress Note  10/16/2023 10:40 AM Nicole Cervantes  MRN:  [id number]  Chief Complaint:  Chief Complaint  Patient presents with   Follow-up   Anxiety   Depression   Insomnia   Medication Refill   Discussed the use of AI scribe software for clinical note transcription with the patient, who gave verbal consent to proceed.  History of Present Illness Nicole Cervantes is a 48 year old Caucasian female, married, employed, lives in [place], has a history of major depression, agoraphobia, social anxiety, hypertension, diabetes mellitus was evaluated by telemedicine today.  She reports improved sleep quality since starting trazodone  ,which has led her to feel much better and experience enhanced well-being. She connects her improved mood to better sleep.  She denies experiencing flashbacks, nightmares, or vivid dreams. Her mood remains very positive, and she describes feeling wonderful. Journaling and support from her husband, friends, and family serve as effective coping strategies for managing stress and trauma-related  thoughts. She did not reconnect with her previous therapist, stating she did not feel a strong connection, but she finds her current coping mechanisms effective.  She reports significant improvement in anxiety, including social anxiety and agoraphobia, which has allowed her to increase engagement in social and community activities. She attends bowling tournaments with her husband, goes out with her dog and granddaughter, and participates in a book club independently. She states she is able to go to the store and other places by herself and feels everything is moving in a positive direction.  She denies any thoughts of hurting herself or others.  She is compliant on her medications like Wellbutrin , sertraline , denies side effects.   Visit Diagnosis:    ICD-10-CM   1. Recurrent major depressive disorder, in full remission (HCC)  F33.42     2. Agoraphobia  F40.00     3. Social anxiety disorder  F40.10     4. Trauma and stressor-related disorder  F43.9    unspecified , R/O PTSD    5. Insomnia due to medical condition  G47.01 traZODone  (DESYREL ) 50 MG tablet   sleep apnea on CPAP, mood symptoms      Past Psychiatric History: I have reviewed past psychiatric history from progress note on 11/28/2021.  Past trials of medications like Paxil , Lamictal , Wellbutrin , Seroquel.  Past Medical History:  Past Medical History:  Diagnosis Date   Allergy    Anxiety    Back pain    Bipolar 1 disorder (HCC)    Carpal tunnel syndrome    bilateral   Cervical spondylosis with myelopathy and radiculopathy    Depression    Essential hypertension  DR. ROJELIO - [facility]   Iron deficiency anemia    Major depressive disorder    Migraines    Mixed hyperlipidemia    Obstructive sleep apnea on CPAP    Polycystic ovarian syndrome    Type 2 diabetes mellitus with hyperglycemia, without long-term current use of insulin (HCC)    Vestibular migraine     Past Surgical History:  Procedure  Laterality Date   ABDOMINAL HYSTERECTOMY  06/03/07   IN [place], [place] - TOTAL EXCEPT OVARIES, LEIOMYOMA   ANTERIOR CERVICAL DECOMP/DISCECTOMY FUSION N/A 03/11/2023   Procedure: C4-6 ANTERIOR CERVICAL DISCECTOMY AND FUSION;  Surgeon: Clois Fret, MD;  Location: [facility] ORS;  Service: Neurosurgery;  Laterality: N/A;   COLONOSCOPY  2006   DX   COLONOSCOPY  2024   LAPAROSCOPY  2000   BURNED HOLES IN OVARIES TO RELEASE PRESSURE FROM CYSTS.  DR. BURNS, [place]   OVARIAN CYST REMOVAL     PILONIDAL CYST EXCISION     TONSILLECTOMY AND ADENOIDECTOMY  1978    Family Psychiatric History: I have reviewed family psychiatric history from progress note on 11/28/2021.  Family History:  Family History  Problem Relation Age of Onset   Diabetes Mother        passed away 02-Jun-2021   Hypertension Mother    Depression Mother    Stroke Mother    Throat cancer Mother    Alcohol abuse Father    Prostate cancer Father 59   Diabetes Father    Hypertension Father    Bipolar disorder Sister    Bladder Cancer Sister 84       in remission   Lupus Sister    Bipolar disorder Sister    Depression Maternal Aunt    Depression Maternal Uncle    Heart disease Paternal Uncle    Heart disease Maternal Grandmother    Alcohol abuse Maternal Grandmother    Depression Maternal Grandmother    Alcohol abuse Maternal Grandfather    Depression Maternal Grandfather    Bipolar disorder Other     Social History: I have reviewed social history from progress note on 11/28/2021. Social History   Socioeconomic History   Marital status: Married    Spouse name: Viona   Number of children: 1   Years of education: 14   Highest education level: Associate degree: occupational, Scientist, product/process development, or vocational program  Occupational History    Employer: [employer]  Tobacco Use   Smoking status: Former    Current packs/day: 0.00    Types: Cigarettes    Start date: 1989    Quit date: 2009    Years since quitting: 16.7    Smokeless tobacco: Never  Vaping Use   Vaping status: Never Used  Substance and Sexual Activity   Alcohol use: Yes    Comment: occasionally once a month   Drug use: No   Sexual activity: Yes    Birth control/protection: Surgical    Comment: Hysterectomy   Other Topics Concern   Not on file  Social History Narrative   Not on file   Social Drivers of Health   Financial Resource Strain: Medium Risk (07/03/2023)   Overall Financial Resource Strain (CARDIA)    Difficulty of Paying Living Expenses: Somewhat hard  Food Insecurity: No Food Insecurity (07/03/2023)   Hunger Vital Sign    Worried About Running Out of Food in the Last Year: Never true    Ran Out of Food in the Last Year: Never true  Transportation Needs:  No Transportation Needs (07/03/2023)   PRAPARE - Administrator, Civil Service (Medical): No    Lack of Transportation (Non-Medical): No  Physical Activity: Unknown (07/03/2023)   Exercise Vital Sign    Days of Exercise per Week: 0 days    Minutes of Exercise per Session: Not on file  Stress: No Stress Concern Present (07/03/2023)   [employer] of Occupational Health - Occupational Stress Questionnaire    Feeling of Stress : Only a little  Social Connections: Moderately Integrated (07/03/2023)   Social Connection and Isolation Panel    Frequency of Communication with Friends and Family: More than three times a week    Frequency of Social Gatherings with Friends and Family: Once a week    Attends Religious Services: Never    Database administrator or Organizations: Yes    Attends Engineer, structural: More than 4 times per year    Marital Status: Married    Allergies:  Allergies  Allergen Reactions   Ozempic  (0.25 Or 0.5 Mg-Dose) [Semaglutide (0.25 Or 0.5mg -Dos)] Other (See Comments)    heartburn   Mounjaro  [Tirzepatide ] Rash    Metabolic Disorder Labs: Lab Results  Component Value Date   HGBA1C 6.4 (H) 03/04/2023   MPG 136.98 03/04/2023    MPG 117 10/25/2015   No results found for: PROLACTIN Lab Results  Component Value Date   CHOL 180 07/09/2023   TRIG 169.0 (H) 07/09/2023   HDL 48.30 07/09/2023   CHOLHDL 4 07/09/2023   VLDL 33.8 07/09/2023   LDLCALC 98 07/09/2023   LDLCALC 63 05/23/2022   Lab Results  Component Value Date   TSH 2.00 07/09/2023   TSH 2.19 11/08/2021    Therapeutic Level Labs: No results found for: LITHIUM No results found for: VALPROATE No results found for: CBMZ  Current Medications: Current Outpatient Medications  Medication Sig Dispense Refill   ACCU-CHEK GUIDE test strip USE TO CHECK BLOOD SUGAR UP TO 4 TIMES DAILY AS DIRECTED     Accu-Chek Softclix Lancets lancets SMARTSIG:Topical 1-4 Times Daily     acetaminophen  (TYLENOL ) 500 MG tablet Take 500-1,000 mg by mouth every 6 (six) hours as needed (pain.).     amLODipine  (NORVASC ) 10 MG tablet Take 1 tablet by mouth once daily 90 tablet 3   atorvastatin  (LIPITOR) 10 MG tablet Take 1 tablet by mouth once daily 90 tablet 0   benzonatate  (TESSALON ) 100 MG capsule Take 1 capsule (100 mg total) by mouth 3 (three) times daily as needed for cough. 30 capsule 0   blood glucose meter kit and supplies Dispense based on patient and insurance preference. Use up to four times daily as directed. (FOR ICD-10 E10.9, E11.9). 1 each 0   buPROPion  (WELLBUTRIN ) 75 MG tablet Take 1 tablet (75 mg total) by mouth every morning. 90 tablet 0   clobetasol  cream (TEMOVATE ) 0.05 % Apply 1 Application topically 2 (two) times daily. 30 g 11   clotrimazole -betamethasone  (LOTRISONE ) cream Apply externally BID prn sx up to 2 wks 15 g 1   losartan  (COZAAR ) 50 MG tablet Take 1 tablet (50 mg total) by mouth daily.     metFORMIN  (GLUCOPHAGE ) 500 MG tablet Take 1 tablet (500 mg total) by mouth 2 (two) times daily with a meal. 180 tablet 1   methocarbamol  (ROBAXIN ) 500 MG tablet Take 1 tablet (500 mg total) by mouth 4 (four) times daily. 120 tablet 0   metoprolol   tartrate (LOPRESSOR ) 100 MG tablet Take 1 tablet (100 mg  total) by mouth once for 1 dose. 1 tablet 0   pantoprazole  (PROTONIX ) 20 MG tablet Take 1 tablet by mouth once daily 30 tablet 2   rOPINIRole  (REQUIP ) 0.25 MG tablet TAKE 1 TABLET BY MOUTH AT BEDTIME 90 tablet 0   sertraline  (ZOLOFT ) 100 MG tablet Take 2 tablets by mouth once daily 180 tablet 3   tacrolimus  (PROTOPIC ) 0.1 % ointment APPLY 2 grams TO AFFECTED AREAS OF RASH TWICE DAILY UNTIL IMPROVED 60 g 2   traZODone  (DESYREL ) 50 MG tablet Take 0.5-1 tablets (25-50 mg total) by mouth at bedtime as needed for sleep. 90 tablet 0   No current facility-administered medications for this visit.     Musculoskeletal: Strength & Muscle Tone: UTA Gait & Station: Seated Patient leans: N/A  Psychiatric Specialty Exam: Review of Systems  Psychiatric/Behavioral:  The patient is nervous/anxious.     There were no vitals taken for this visit.There is no height or weight on file to calculate BMI.  General Appearance: Casual  Eye Contact:  Fair  Speech:  Clear and Coherent  Volume:  Normal  Mood:  Anxious coping better  Affect:  Appropriate  Thought Process:  Goal Directed and Descriptions of Associations: Intact  Orientation:  Full (Time, Place, and Person)  Thought Content: Logical   Suicidal Thoughts:  No  Homicidal Thoughts:  No  Memory:  Immediate;   Fair Recent;   Fair Remote;   Fair  Judgement:  Fair  Insight:  Fair  Psychomotor Activity:  Normal  Concentration:  Concentration: Fair and Attention Span: Fair  Recall:  [employer] of Knowledge: Fair  Language: Fair  Akathisia:  No  Handed:  Right  AIMS (if indicated): not done  Assets:  Communication Skills Desire for Improvement Housing Social Support Talents/Skills Transportation  ADL's:  Intact  Cognition: WNL  Sleep:  improved   Screenings: AUDIT    Garment/textile technologist Visit from 07/07/2023 in [facility] [place] Office Visit from  10/16/2022 in [facility] Admission (Discharged) from 10/24/2015 in [facility]  Alcohol Use Disorder Identification Test Final Score (AUDIT) 3  4  0   GAD-7    Flowsheet Row Office Visit from 07/07/2023 in [facility] Visit from 05/11/2023 in [facility] Office Visit from 10/16/2022 in [facility] Office Visit from 05/15/2022 in [facility] Office Visit from 03/19/2022 in [facility]  Total GAD-7 Score 1 0 2 0 0   PHQ2-9    Flowsheet Row Office Visit from 07/07/2023 in [facility] Office Visit from 05/11/2023 in [facility] Video Visit from 02/09/2023 in [facility] Office Visit from 10/16/2022 in [facility] Video Visit from 10/07/2022 in [facility]  PHQ-2 Total Score 0 0 0 0 0  PHQ-9 Total Score 3 -- -- 6 --   Flowsheet Row Video Visit from 10/16/2023 in [facility] Video Visit from 09/14/2023 in [facility] Office Visit from 05/11/2023 in [facility]  C-SSRS RISK CATEGORY Moderate Risk Moderate Risk No Risk     Assessment and Plan: Nicole Cervantes is a 48 year old Caucasian female, presented for follow-up appointment  discussed assessment and plan as noted below.  MDD in remission Currently denies any significant depression symptoms Continue Sertraline  200 mg daily Continue Wellbutrin  75 mg daily  Agoraphobia/Social anxiety disorder-stable Currently reports overall symptoms as manageable and has been engaged in social activities Continue current  medication regimen.  Insomnia/Trauma related disorder unspecified-rule out PTSD-improving Currently denies any significant flashbacks nightmares or intrusive memories and sleep has improved with the addition of trazodone  Continue Trazodone  50 mg at bedtime as needed Continue Sertraline  200 mg daily Continue CPAP therapy  Follow-up Follow-up in clinic in 6 to 8 weeks or sooner if needed.    Patient/Guardian was advised Release of Information must be obtained prior to any record release in order to collaborate their care with an outside provider. Patient/Guardian was advised if they have not already done so to contact the registration department to sign all necessary forms in order for us  to release information regarding their care.   Consent: Patient/Guardian gives verbal consent for treatment and assignment of benefits for services provided during this visit. Patient/Guardian expressed understanding and agreed to proceed.  This note was generated in part or whole with voice recognition software. Voice recognition is usually quite accurate but there are transcription errors that can and very often do occur. I apologize for any typographical errors that were not detected and corrected.     Vonette Grosso, MD 10/16/2023, 10:40 AM

## 2023-10-19 ENCOUNTER — Ambulatory Visit
Admission: RE | Admit: 2023-10-19 | Discharge: 2023-10-19 | Disposition: A | Discharge status: Home / Self Care | Source: Ambulatory Visit | Attending: Cardiovascular Disease | Admitting: Cardiovascular Disease

## 2023-10-19 DIAGNOSIS — E782 Mixed hyperlipidemia: Secondary | ICD-10-CM | POA: Insufficient documentation

## 2023-10-19 DIAGNOSIS — R072 Precordial pain: Secondary | ICD-10-CM | POA: Insufficient documentation

## 2023-10-19 DIAGNOSIS — I517 Cardiomegaly: Secondary | ICD-10-CM | POA: Diagnosis not present

## 2023-10-19 DIAGNOSIS — I209 Angina pectoris, unspecified: Secondary | ICD-10-CM | POA: Insufficient documentation

## 2023-10-19 DIAGNOSIS — E119 Type 2 diabetes mellitus without complications: Secondary | ICD-10-CM | POA: Diagnosis not present

## 2023-10-19 MED ORDER — NITROGLYCERIN 0.4 MG SL SUBL
0.8000 mg | SUBLINGUAL_TABLET | Freq: Once | SUBLINGUAL | Status: AC
  Administered 2023-10-19: 0.8 mg via SUBLINGUAL
  Filled 2023-10-19: qty 25

## 2023-10-19 MED ORDER — IOHEXOL 350 MG/ML SOLN
100.0000 mL | Freq: Once | INTRAVENOUS | Status: AC | PRN
  Administered 2023-10-19: 100 mL via INTRAVENOUS

## 2023-10-19 NOTE — Progress Notes (Signed)
 Patient tolerated procedure well. W/C to lobby.  Ambulate w/o difficulty. Denies light headedness or being dizzy. Encouraged to drink extra water today and reasoning explained. Verbalized understanding. All questions answered. ABC intact. No further needs. Discharge from procedure area w/o issues.

## 2023-10-20 NOTE — Telephone Encounter (Signed)
 Letter mailed to patient with the following from Dr. Gollan.  Cardiac CTA Calcium  score very low, 5 No significant blockages noted that would contribute to chest pain symptoms This would indicate chest pain is coming from something else, likely musculoskeletal etiology or noncardiac source

## 2023-11-08 ENCOUNTER — Other Ambulatory Visit: Payer: Self-pay | Admitting: Family

## 2023-11-08 DIAGNOSIS — E782 Mixed hyperlipidemia: Secondary | ICD-10-CM

## 2023-11-26 ENCOUNTER — Other Ambulatory Visit: Payer: Self-pay

## 2023-11-26 DIAGNOSIS — M5412 Radiculopathy, cervical region: Secondary | ICD-10-CM

## 2023-11-26 DIAGNOSIS — G959 Disease of spinal cord, unspecified: Secondary | ICD-10-CM

## 2023-11-29 ENCOUNTER — Other Ambulatory Visit: Payer: Self-pay | Admitting: Family

## 2023-11-29 DIAGNOSIS — R12 Heartburn: Secondary | ICD-10-CM

## 2023-11-29 DIAGNOSIS — R0789 Other chest pain: Secondary | ICD-10-CM

## 2023-12-01 ENCOUNTER — Ambulatory Visit: Admitting: Neurosurgery

## 2023-12-01 ENCOUNTER — Ambulatory Visit (INDEPENDENT_AMBULATORY_CARE_PROVIDER_SITE_OTHER)

## 2023-12-01 ENCOUNTER — Encounter: Payer: Self-pay | Admitting: Neurosurgery

## 2023-12-01 VITALS — BP 130/82 | Ht 64.0 in | Wt 246.0 lb

## 2023-12-01 DIAGNOSIS — Z981 Arthrodesis status: Secondary | ICD-10-CM

## 2023-12-01 DIAGNOSIS — M5412 Radiculopathy, cervical region: Secondary | ICD-10-CM

## 2023-12-01 DIAGNOSIS — G959 Disease of spinal cord, unspecified: Secondary | ICD-10-CM

## 2023-12-01 NOTE — Progress Notes (Signed)
   REFERRING PHYSICIAN:  Corwin Antu, Fnp [address] [place],  [place] [address]  DOS: C4-6 ACDF on 03/11/23  HISTORY OF PRESENT ILLNESS: Nicole Cervantes is status post ACDF.   She is doing very well.    PHYSICAL EXAMINATION:  NEUROLOGICAL:  General: In no acute distress.   Awake, alert, oriented to person, place, and time.  Pupils equal round and reactive to light.  Facial tone is symmetric.   Strength: Side Biceps Triceps Deltoid Interossei Grip Wrist Ext. Wrist Flex.  R 5 5 5 5 5 5 5   L 5 5 5 5 5 5 5    Incision c/d/I and healing well  Imaging:  No complications noted  Assessment / Plan: Nicole Cervantes is doing well after her ACDF.    She is doing extremely well.  Will see her back as needed  I spent a total of 10 minutes in this patient's care today. This time was spent reviewing pertinent records including imaging studies, obtaining and confirming history, performing a directed evaluation, formulating and discussing my recommendations, and documenting the visit within the medical record.   Reeves Daisy MD Dept of Neurosurgery

## 2023-12-11 ENCOUNTER — Encounter: Payer: Self-pay | Admitting: Psychiatry

## 2023-12-11 ENCOUNTER — Telehealth: Admitting: Psychiatry

## 2023-12-11 DIAGNOSIS — F4010 Social phobia, unspecified: Secondary | ICD-10-CM | POA: Diagnosis not present

## 2023-12-11 DIAGNOSIS — F4000 Agoraphobia, unspecified: Secondary | ICD-10-CM

## 2023-12-11 DIAGNOSIS — F439 Reaction to severe stress, unspecified: Secondary | ICD-10-CM | POA: Diagnosis not present

## 2023-12-11 DIAGNOSIS — G4701 Insomnia due to medical condition: Secondary | ICD-10-CM

## 2023-12-11 DIAGNOSIS — F3342 Major depressive disorder, recurrent, in full remission: Secondary | ICD-10-CM | POA: Diagnosis not present

## 2023-12-11 MED ORDER — TRAZODONE HCL 50 MG PO TABS
25.0000 mg | ORAL_TABLET | Freq: Every evening | ORAL | 3 refills | Status: AC | PRN
Start: 2023-12-11 — End: ?

## 2023-12-11 MED ORDER — BUPROPION HCL 75 MG PO TABS: 75.0000 mg | ORAL_TABLET | Freq: Every morning | ORAL | 1 refills | Status: AC

## 2023-12-11 NOTE — Progress Notes (Deleted)
 Referring Physician:  Corwin Antu, FNP [address] [place],  [place] [address]  Primary Physician:  Corwin Antu, FNP  History of Present Illness: 12/11/2023 Ms. Nicole Nicole is here today with a chief complaint of ***  Evaluation of possible carpal tunnel syndrome.   12/01/2023 Note from Nicole Goods, PA-C  Telehealth visit was conducted with Nicole Nicole via telephone.  History of Present Illness: Nicole Nicole is a 48 y.o with a history of C4-6 ACDF on 03/11/23. She was seen on 05/28/23 with increased neck pain and new intermittent numbness and tingling into her fingertips that started with increased activity and home neck exercises. We discussed a short course of Robaxin  and monitoring her symptoms.  Today she continues to have some midline neck pain but states that has improved.  She is not sure if this is a result of the Robaxin  or just time.  She also continues to have numbness and tingling from her wrist distally that seems to be into all 5 fingers and on both sides.  She has been wearing wrist splints at night which seems to be helping some.  She denies any new or worsening symptoms.  05/28/23 Nicole Cervantes is a about 2.5 months s/p ACDF. She is largely doing well however she has been having some neck pain particularly first thing in the morning and on the left side since her last visit as well as some intermittent numbness and tingling into her fingertips bilaterally.  She attributes this to the exercises and has been only doing them about once a day.  She is not currently taking any medication for pain.   04/23/23 Nicole Nicole is status post ACDF.  She is doing very well.  She is back at work.  General Review of Systems:  A ROS was performed including pertinent positive and negatives as documented.  All other systems are negative.   Prior to Admission medications   Medication Sig Start Date End Date Taking? Authorizing Provider  ACCU-CHEK GUIDE  test strip USE TO CHECK BLOOD SUGAR UP TO 4 TIMES DAILY AS DIRECTED 11/11/21   [provider]  Accu-Chek Softclix Lancets lancets SMARTSIG:Topical 1-4 Times Daily 11/11/21   [provider]  acetaminophen  (TYLENOL ) 500 MG tablet Take 500-1,000 mg by mouth every 6 (six) hours as needed (pain.).    [provider]  amLODipine  (NORVASC ) 10 MG tablet Take 1 tablet by mouth once daily 12/19/22   Dugal, Tabitha, FNP  atorvastatin  (LIPITOR) 10 MG tablet Take 1 tablet by mouth once daily 05/11/23   Dugal, Tabitha, FNP  blood glucose meter kit and supplies Dispense based on patient and insurance preference. Use up to four times daily as directed. (FOR ICD-10 E10.9, E11.9). 11/11/21   Dugal, Tabitha, FNP  buPROPion  (WELLBUTRIN ) 75 MG tablet TAKE 1 TABLET BY MOUTH IN THE MORNING 04/03/23   Eappen, Saramma, MD  clobetasol  cream (TEMOVATE ) 0.05 % Apply 1 Application topically 2 (two) times daily. Patient taking differently: Apply 1 Application topically 2 (two) times daily as needed (skin irritation.). 05/23/22   Corwin Antu, FNP  clotrimazole -betamethasone  (LOTRISONE ) cream Apply externally BID prn sx up to 2 wks Patient taking differently: Apply 1 Application topically 2 (two) times daily as needed (skin irritation.). 01/04/21   Lynda Bradley, CNM  cyanocobalamin  (VITAMIN B12) 1000 MCG tablet Take 1,000 mcg by mouth in the morning.    [provider]  gabapentin (NEURONTIN) 100 MG capsule Take  100 mg by mouth at bedtime as needed (pain.). 12/11/22   [provider]  losartan  (COZAAR ) 50 MG tablet Take 1 tablet (50 mg total) by mouth daily. MUST HAVE OV FOR FURTHER REFILLS 04/27/23   Corwin Antu, FNP  metFORMIN  (GLUCOPHAGE ) 500 MG tablet Take 1 tablet (500 mg total) by mouth 2 (two) times daily with a meal. MUST HAVE OV FOR FURTHER REFILLS Patient taking differently: Take 500 mg by mouth 2 (two) times daily with a meal. Takes as needed 04/27/23   Corwin Antu, FNP   methocarbamol  (ROBAXIN ) 500 MG tablet Take 1 tablet (500 mg total) by mouth 4 (four) times daily. 05/28/23   Nicole Nicole Ruth, PA  omeprazole  (PRILOSEC) 20 MG capsule Take 20 mg by mouth every other day. In the morning.    [provider]  rOPINIRole  (REQUIP ) 0.25 MG tablet Take 1 tablet (0.25 mg total) by mouth at bedtime. 10/16/22   Dugal, Tabitha, FNP  sertraline  (ZOLOFT ) 100 MG tablet Take 2 tablets by mouth once daily 12/19/22   Corwin Antu, FNP    DATA REVIEWED    Imaging Studies  05/28/23 c spine xrays  FINDINGS: Stable cervical spine alignment straightening of normal lordosis. Anterior fusion C4 through C6 with interbody spacers. The hardware appears intact. C6-C7 disc space narrowing and spurring, unchanged. No evidence of fracture or focal bone abnormality. No prevertebral soft tissue thickening.   IMPRESSION: 1. Anterior fusion C4 through C6 without hardware complication. 2. C6-C7 degenerative disc disease.     Electronically Signed   By: Andrea Gasman M.D.   On: 06/09/2023 17:16  IMPRESSION  Nicole Nicole is a 48 y.o. Cervantes who I performed a telephone encounter today for evaluation and management of neck and bilateral hand numbness and tingling.   PLAN  Nicole Nicole is a 48 y.o presenting with ongoing neck pain and numbness bilaterally after an ACDF in July of this year.  We briefly discussed the differential diagnosis of her symptoms and it is possible she has some ongoing cervical foraminal stenosis versus bilateral carpal tunnel.  We discussed the workup to include an MRI and possible EMG nerve conduction studies however she would like to continue with conservative management at this time.  She was instructed to call us  should her symptoms change or worsen or should she decide to move forward with these studies.  She will otherwise keep her 66-month follow-up with Dr. Clois with cervical x-rays prior.  No orders of the defined types were placed in this  encounter.  DISPOSITION  Follow up: In person appointment in regularly scheduled post-op follow-up.  Nicole Cervantes Gregory, PA   TELEPHONE DOCUMENTATION  This visit was performed via telephone.  Patient location: home Provider location: office  I spent a total of 5 minutes non-face-to-face activities for this visit on the date of this encounter including review of current clinical condition and response to treatment.  The patient is aware of and accepts the limits of this telehealth visit.

## 2023-12-11 NOTE — Progress Notes (Signed)
 Virtual Visit via Video Note  I connected with Nicole Cervantes on 12/11/23 at 11:00 AM EST by a video enabled telemedicine application and verified that I am speaking with the correct person using two identifiers.  Location Provider Location : [facility] Patient Location : Work  Participants: Patient , Provider    I discussed the limitations of evaluation and management by telemedicine and the availability of in person appointments. The patient expressed understanding and agreed to proceed.   I discussed the assessment and treatment plan with the patient. The patient was provided an opportunity to ask questions and all were answered. The patient agreed with the plan and demonstrated an understanding of the instructions.   The patient was advised to call back or seek an in-person evaluation if the symptoms worsen or if the condition fails to improve as anticipated.   BH MD OP Progress Note  12/11/2023 11:09 AM Nicole Cervantes  MRN:  [id number]  Chief Complaint:  Chief Complaint  Patient presents with   Follow-up   Anxiety   Depression   Medication Refill   Discussed the use of AI scribe software for clinical note transcription with the patient, who gave verbal consent to proceed.  History of Present Illness Nicole Cervantes is a 48 year old Caucasian female married, employed, lives in [place], has a history of MDD, agoraphobia, social anxiety, hypertension, diabetes mellitus was evaluated by telemedicine today.  She reports doing very well overall and describes being socially active. She reports that her anxiety has improved significantly, and she copes effectively with work-related stress by taking walks when feeling overwhelmed. She reports coping better than she thought she would with the recent increase in work stress after a coworker left.  She reports that her sleep remains decent, and she identifies a persistent cough as the cause of any disruption rather than symptoms related to her  mental health. She reports no concerns or side effects related to her current medications, which include sertraline  200 mg, trazodone  and bupropion  75 mg, and she confirms that she has remained stable on these doses since the last visit. She denies any thoughts of harming herself or others.  She lives with her husband. Socially active, she goes out with friends every weekend, hosted a Editor, commissioning and book club, attended a friend's house with grandchildren for trick or treating, and plans to attend a fall festival with her husband.     Visit Diagnosis:    ICD-10-CM   1. Recurrent major depressive disorder, in full remission  F33.42 buPROPion  (WELLBUTRIN ) 75 MG tablet    2. Agoraphobia  F40.00     3. Social anxiety disorder  F40.10     4. Trauma and stressor-related disorder  F43.9    unspecified , R/O PTSD    5. Insomnia due to medical condition  G47.01 traZODone  (DESYREL ) 50 MG tablet   sleep apnea on CPAP, mood symptoms      Past Psychiatric History: I have reviewed past psychiatric history from progress note on 11/28/2021.  Past trials of medications like Paxil , Lamictal , Wellbutrin , Seroquel  Past Medical History:  Past Medical History:  Diagnosis Date   Allergy    Anxiety    Back pain    Bipolar 1 disorder (HCC)    Carpal tunnel syndrome    bilateral   Cervical spondylosis with myelopathy and radiculopathy    Depression    Essential hypertension    DR. GRANDIS - [facility]   Iron deficiency anemia  Major depressive disorder    Migraines    Mixed hyperlipidemia    Obstructive sleep apnea on CPAP    Polycystic ovarian syndrome    Type 2 diabetes mellitus with hyperglycemia, without long-term current use of insulin (HCC)    Vestibular migraine     Past Surgical History:  Procedure Laterality Date   ABDOMINAL HYSTERECTOMY  06/14/2007   IN [place], [place] - TOTAL EXCEPT OVARIES, LEIOMYOMA   ANTERIOR CERVICAL DECOMP/DISCECTOMY FUSION N/A 03/11/2023   Procedure:  C4-6 ANTERIOR CERVICAL DISCECTOMY AND FUSION;  Surgeon: Clois Fret, MD;  Location: [facility] ORS;  Service: Neurosurgery;  Laterality: N/A;   COLONOSCOPY  2006   DX   COLONOSCOPY  2024   LAPAROSCOPY  2000   BURNED HOLES IN OVARIES TO RELEASE PRESSURE FROM CYSTS.  DR. BURNS, [place]   OVARIAN CYST REMOVAL     PILONIDAL CYST EXCISION     TONSILLECTOMY AND ADENOIDECTOMY  1978    Family Psychiatric History: I have reviewed family psychiatric history from progress note on 11/28/2021.  Family History:  Family History  Problem Relation Age of Onset   Diabetes Mother        passed away Jun 13, 2021   Hypertension Mother    Depression Mother    Stroke Mother    Throat cancer Mother    Alcohol abuse Father    Prostate cancer Father 22   Diabetes Father    Hypertension Father    Bipolar disorder Sister    Bladder Cancer Sister 74       in remission   Lupus Sister    Bipolar disorder Sister    Depression Maternal Aunt    Depression Maternal Uncle    Heart disease Paternal Uncle    Heart disease Maternal Grandmother    Alcohol abuse Maternal Grandmother    Depression Maternal Grandmother    Alcohol abuse Maternal Grandfather    Depression Maternal Grandfather    Bipolar disorder Other     Social History: I have reviewed social history from progress note on 11/28/2021. Social History   Socioeconomic History   Marital status: Married    Spouse name: Viona   Number of children: 1   Years of education: 14   Highest education level: Associate degree: occupational, scientist, product/process development, or vocational program  Occupational History    Employer: [employer]  Tobacco Use   Smoking status: Former    Current packs/day: 0.00    Types: Cigarettes    Start date: 1989    Quit date: 2009    Years since quitting: 16.8   Smokeless tobacco: Never  Vaping Use   Vaping status: Never Used  Substance and Sexual Activity   Alcohol use: Yes    Comment: occasionally once a month   Drug use: No    Sexual activity: Yes    Birth control/protection: Surgical    Comment: Hysterectomy   Other Topics Concern   Not on file  Social History Narrative   Not on file   Social Drivers of Health   Financial Resource Strain: Medium Risk (07/03/2023)   Overall Financial Resource Strain (CARDIA)    Difficulty of Paying Living Expenses: Somewhat hard  Food Insecurity: No Food Insecurity (07/03/2023)   Hunger Vital Sign    Worried About Running Out of Food in the Last Year: Never true    Ran Out of Food in the Last Year: Never true  Transportation Needs: No Transportation Needs (07/03/2023)   PRAPARE - Transportation    Lack of  Transportation (Medical): No    Lack of Transportation (Non-Medical): No  Physical Activity: Unknown (07/03/2023)   Exercise Vital Sign    Days of Exercise per Week: 0 days    Minutes of Exercise per Session: Not on file  Stress: No Stress Concern Present (07/03/2023)   [employer] of Occupational Health - Occupational Stress Questionnaire    Feeling of Stress : Only a little  Social Connections: Moderately Integrated (07/03/2023)   Social Connection and Isolation Panel    Frequency of Communication with Friends and Family: More than three times a week    Frequency of Social Gatherings with Friends and Family: Once a week    Attends Religious Services: Never    Database Administrator or Organizations: Yes    Attends Engineer, Structural: More than 4 times per year    Marital Status: Married    Allergies:  Allergies  Allergen Reactions   Ozempic  (0.25 Or 0.5 Mg-Dose) [Semaglutide (0.25 Or 0.5mg -Dos)] Other (See Comments)    heartburn   Mounjaro  [Tirzepatide ] Rash    Metabolic Disorder Labs: Lab Results  Component Value Date   HGBA1C 6.4 (H) 03/04/2023   MPG 136.98 03/04/2023   MPG 117 10/25/2015   No results found for: PROLACTIN Lab Results  Component Value Date   CHOL 180 07/09/2023   TRIG 169.0 (H) 07/09/2023   HDL 48.30 07/09/2023    CHOLHDL 4 07/09/2023   VLDL 33.8 07/09/2023   LDLCALC 98 07/09/2023   LDLCALC 63 05/23/2022   Lab Results  Component Value Date   TSH 2.00 07/09/2023   TSH 2.19 11/08/2021    Therapeutic Level Labs: No results found for: LITHIUM No results found for: VALPROATE No results found for: CBMZ  Current Medications: Current Outpatient Medications  Medication Sig Dispense Refill   ACCU-CHEK GUIDE test strip USE TO CHECK BLOOD SUGAR UP TO 4 TIMES DAILY AS DIRECTED     Accu-Chek Softclix Lancets lancets SMARTSIG:Topical 1-4 Times Daily     acetaminophen  (TYLENOL ) 500 MG tablet Take 500-1,000 mg by mouth every 6 (six) hours as needed (pain.).     amLODipine  (NORVASC ) 10 MG tablet Take 1 tablet by mouth once daily 90 tablet 3   atorvastatin  (LIPITOR) 10 MG tablet Take 1 tablet by mouth once daily 90 tablet 0   benzonatate  (TESSALON ) 100 MG capsule Take 1 capsule (100 mg total) by mouth 3 (three) times daily as needed for cough. 30 capsule 0   blood glucose meter kit and supplies Dispense based on patient and insurance preference. Use up to four times daily as directed. (FOR ICD-10 E10.9, E11.9). 1 each 0   buPROPion  (WELLBUTRIN ) 75 MG tablet Take 1 tablet (75 mg total) by mouth every morning. 90 tablet 1   clobetasol  cream (TEMOVATE ) 0.05 % Apply 1 Application topically 2 (two) times daily. 30 g 11   clotrimazole -betamethasone  (LOTRISONE ) cream Apply externally BID prn sx up to 2 wks 15 g 1   losartan  (COZAAR ) 50 MG tablet Take 1 tablet (50 mg total) by mouth daily.     metFORMIN  (GLUCOPHAGE ) 500 MG tablet Take 1 tablet (500 mg total) by mouth 2 (two) times daily with a meal. 180 tablet 1   methocarbamol  (ROBAXIN ) 500 MG tablet Take 1 tablet (500 mg total) by mouth 4 (four) times daily. 120 tablet 0   metoprolol  tartrate (LOPRESSOR ) 100 MG tablet Take 1 tablet (100 mg total) by mouth once for 1 dose. 1 tablet 0   pantoprazole  (PROTONIX ) 20  MG tablet Take 1 tablet by mouth once daily 90  tablet 0   rOPINIRole  (REQUIP ) 0.25 MG tablet TAKE 1 TABLET BY MOUTH AT BEDTIME 90 tablet 0   sertraline  (ZOLOFT ) 100 MG tablet Take 2 tablets by mouth once daily 180 tablet 3   tacrolimus  (PROTOPIC ) 0.1 % ointment APPLY 2 grams TO AFFECTED AREAS OF RASH TWICE DAILY UNTIL IMPROVED 60 g 2   traZODone  (DESYREL ) 50 MG tablet Take 0.5-1 tablets (25-50 mg total) by mouth at bedtime as needed for sleep. 90 tablet 3   No current facility-administered medications for this visit.     Musculoskeletal: Strength & Muscle Tone: UTA Gait & Station: Seated Patient leans: N/A  Psychiatric Specialty Exam: Review of Systems  Psychiatric/Behavioral:  Positive for sleep disturbance.     There were no vitals taken for this visit.There is no height or weight on file to calculate BMI.  General Appearance: Casual  Eye Contact:  Fair  Speech:  Clear and Coherent  Volume:  Normal  Mood:  Euthymic  Affect:  Congruent  Thought Process:  Goal Directed and Descriptions of Associations: Intact  Orientation:  Full (Time, Place, and Person)  Thought Content: Logical   Suicidal Thoughts:  No  Homicidal Thoughts:  No  Memory:  Immediate;   Fair Recent;   Fair Remote;   Fair  Judgement:  Fair  Insight:  Fair  Psychomotor Activity:  Normal  Concentration:  Concentration: Fair and Attention Span: Fair  Recall:  [employer] of Knowledge: Fair  Language: Fair  Akathisia:  No  Handed:  Right  AIMS (if indicated): not done  Assets:  Communication Skills Desire for Improvement Housing Social Support Talents/Skills Transportation  ADL's:  Intact  Cognition: WNL  Sleep:  restless due to cough   Screenings: AUDIT    Loss Adjuster, Chartered Office Visit from 07/07/2023 in [facility] Visit from 10/16/2022 in [facility] Admission (Discharged) from 10/24/2015 in [facility]  Alcohol Use Disorder Identification Test Final  Score (AUDIT) 3  4  0   GAD-7    Flowsheet Row Office Visit from 07/07/2023 in [facility] [employer] [facility] [place] Office Visit from 05/11/2023 in [facility] Office Visit from 10/16/2022 in [facility] [place] Office Visit from 05/15/2022 in [facility] [place] Office Visit from 03/19/2022 in [facility]  Total GAD-7 Score 1 0 2 0 0   PHQ2-9    Flowsheet Row Office Visit from 07/07/2023 in [facility] [place] Office Visit from 05/11/2023 in [facility] Video Visit from 02/09/2023 in [facility] Office Visit from 10/16/2022 in [facility] Video Visit from 10/07/2022 in [facility]  PHQ-2 Total Score 0 0 0 0 0  PHQ-9 Total Score 3 -- -- 6 --   Flowsheet Row Video Visit from 12/11/2023 in [facility] Video Visit from 10/16/2023 in [facility] Video Visit from 09/14/2023 in [facility]  C-SSRS RISK CATEGORY Moderate Risk Moderate Risk Moderate Risk     Assessment and Plan: Nicole Cervantes is a 48 year old Caucasian female, presented for a follow-up appointment, discussed assessment and plan as noted below.  1. Recurrent major depressive  disorder, in full remission Currently denies any significant depression symptoms Continue Sertraline  200 mg daily Continue Wellbutrin  75 mg daily  2. Agoraphobia-improving Currently reports she has been more active and engaged in social activities Continue Sertraline  200 mg daily  3. Social anxiety disorder-stable Denies any current concerns Continue Sertraline  200 mg daily  4. Trauma and stressor-related disorder  unspecified-rule out PTSD-improving Denies any concerns like flashbacks nightmares or intrusive memories. Continue Trazodone  50 mg at bedtime as needed Continue Sertraline  as prescribed  5. Insomnia due to medical condition-unstable Current sleep problems due to a cough otherwise trazodone  was beneficial. Planning to go to [place] clinic tomorrow for evaluation of her cough. Continue Trazodone  as prescribed Continue CPAP therapy  Follow-up Follow-up in clinic in 3 months or sooner in person.    Collaboration of Care: Collaboration of Care: Other encouraged to follow-up with primary care/urgent care for cough  Patient/Guardian was advised Release of Information must be obtained prior to any record release in order to collaborate their care with an outside provider. Patient/Guardian was advised if they have not already done so to contact the registration department to sign all necessary forms in order for us  to release information regarding their care.   Consent: Patient/Guardian gives verbal consent for treatment and assignment of benefits for services provided during this visit. Patient/Guardian expressed understanding and agreed to proceed.   This note was generated in part or whole with voice recognition software. Voice recognition is usually quite accurate but there are transcription errors that can and very often do occur. I apologize for any typographical errors that were not detected and corrected.    Pharrah Rottman, MD 12/13/2023, 6:03 PM

## 2023-12-21 ENCOUNTER — Encounter: Admitting: Neurosurgery

## 2023-12-25 ENCOUNTER — Other Ambulatory Visit: Payer: Self-pay | Admitting: Family

## 2023-12-25 DIAGNOSIS — G2581 Restless legs syndrome: Secondary | ICD-10-CM

## 2023-12-29 NOTE — Progress Notes (Signed)
 Referring Physician:  Corwin Antu, FNP [address] [place],  [place] [address]  Primary Physician:  Corwin Antu, FNP  History of Present Illness: 01/11/2024 Nicole Cervantes is here today with a chief complaint of left-sided carpal tunnel syndrome.  She has a longstanding history of bilateral carpal tunnel syndrome and was previously scheduled to have a left-sided carpal tunnel decompression.  Unfortunately had some insurance issues and had to have this canceled.  She previously went under a cervical decompression and fusion.  Is here today to discuss her left-sided carpal tunnel syndrome predominantly.  She has nighttime symptoms, gets daily symptoms when utilizing her hand to hold her phone or read a book.  She often has to ring out her hand.  This wakes her up multiple times a week and impacts her daily activities on a daily basis.  General Review of Systems:  A ROS was performed including pertinent positive and negatives as documented.  All other systems are negative.   Prior to Admission medications   Medication Sig Start Date End Date Taking? Authorizing Provider  ACCU-CHEK GUIDE test strip USE TO CHECK BLOOD SUGAR UP TO 4 TIMES DAILY AS DIRECTED 11/11/21   [provider]  Accu-Chek Softclix Lancets lancets SMARTSIG:Topical 1-4 Times Daily 11/11/21   [provider]  acetaminophen  (TYLENOL ) 500 MG tablet Take 500-1,000 mg by mouth every 6 (six) hours as needed (pain.).    [provider]  amLODipine  (NORVASC ) 10 MG tablet Take 1 tablet by mouth once daily 12/19/22   Dugal, Tabitha, FNP  atorvastatin  (LIPITOR) 10 MG tablet Take 1 tablet by mouth once daily 05/11/23   Dugal, Tabitha, FNP  blood glucose meter kit and supplies Dispense based on patient and insurance preference. Use up to four times daily as directed. (FOR ICD-10 E10.9, E11.9). 11/11/21   Dugal, Tabitha, FNP  buPROPion  (WELLBUTRIN ) 75 MG tablet TAKE 1 TABLET BY MOUTH IN THE MORNING  04/03/23   Eappen, Saramma, MD  clobetasol  cream (TEMOVATE ) 0.05 % Apply 1 Application topically 2 (two) times daily. Patient taking differently: Apply 1 Application topically 2 (two) times daily as needed (skin irritation.). 05/23/22   Corwin Antu, FNP  clotrimazole -betamethasone  (LOTRISONE ) cream Apply externally BID prn sx up to 2 wks Patient taking differently: Apply 1 Application topically 2 (two) times daily as needed (skin irritation.). 01/04/21   Lynda Bradley, CNM  cyanocobalamin  (VITAMIN B12) 1000 MCG tablet Take 1,000 mcg by mouth in the morning.    [provider]  gabapentin (NEURONTIN) 100 MG capsule Take 100 mg by mouth at bedtime as needed (pain.). 12/11/22   [provider]  losartan  (COZAAR ) 50 MG tablet Take 1 tablet (50 mg total) by mouth daily. MUST HAVE OV FOR FURTHER REFILLS 04/27/23   Corwin Antu, FNP  metFORMIN  (GLUCOPHAGE ) 500 MG tablet Take 1 tablet (500 mg total) by mouth 2 (two) times daily with a meal. MUST HAVE OV FOR FURTHER REFILLS Patient taking differently: Take 500 mg by mouth 2 (two) times daily with a meal. Takes as needed 04/27/23   Corwin Antu, FNP  methocarbamol  (ROBAXIN ) 500 MG tablet Take 1 tablet (500 mg total) by mouth 4 (four) times daily. 05/28/23   Gregory Edsel Ruth, PA  omeprazole  (PRILOSEC) 20 MG capsule Take 20 mg by mouth every other day. In the morning.    [provider]  rOPINIRole  (REQUIP ) 0.25 MG tablet Take 1 tablet (0.25 mg total) by mouth at bedtime. 10/16/22  Dugal, Tabitha, FNP  sertraline  (ZOLOFT ) 100 MG tablet Take 2 tablets by mouth once daily 12/19/22   Corwin Antu, FNP    DATA REVIEWED    Imaging Studies  05/28/23 c spine xrays  FINDINGS: Stable cervical spine alignment straightening of normal lordosis. Anterior fusion C4 through C6 with interbody spacers. The hardware appears intact. C6-C7 disc space narrowing and spurring, unchanged. No evidence of fracture or focal bone abnormality. No  prevertebral soft tissue thickening.   IMPRESSION: 1. Anterior fusion C4 through C6 without hardware complication. 2. C6-C7 degenerative disc disease.     Electronically Signed   By: Andrea Gasman M.D.   On: 06/09/2023 17:16  IMPRESSION  [name] is a 48 year old woman with a history of bilateral carpal tunnel syndrome.  She has had progressive left-sided carpal tunnel syndrome and carpal tunnel symptoms.  She has numbness and tingling in her median nerve distribution at the wrist.  She physical examination she shows positive Tinel sign positive Phalen and reverse Phalen.  She also shows some very mild weakness in the left sided median nerve musculature compared to the right sided median nerve musculature.  She shows decreased sensation with splitting of the ring finger on the median side.  PLAN  Plan to go forward with a left-sided carpal tunnel decompression with ultrasound guidance.  We discussed risk and benefits of surgery.  Given her progressive numbness tingling and difficulty utilizing her left upper extremity for activities of daily living we will plan to go forward with a decompression.  She has been wearing her braces religiously, and if she does not she gets severe symptoms that wake her within a short period of time.  Even with the braces she wakes up multiple times a week.  She has had previous injections.  Will plan for a left-sided carpal tunnel decompression with ultrasound guidance.  We discussed the risks and benefits of surgery.  She would like to go forward with a decompression   Penne MICAEL Sharps, MD [facility] neurosurgery [place].

## 2023-12-31 ENCOUNTER — Other Ambulatory Visit: Payer: Self-pay | Admitting: Medical Genetics

## 2024-01-02 ENCOUNTER — Other Ambulatory Visit
Admission: RE | Admit: 2024-01-02 | Discharge: 2024-01-02 | Disposition: A | Discharge status: Home / Self Care | Source: Ambulatory Visit | Attending: Family | Admitting: Family

## 2024-01-06 ENCOUNTER — Encounter: Payer: Self-pay | Admitting: Family

## 2024-01-06 ENCOUNTER — Ambulatory Visit: Admitting: Family

## 2024-01-06 VITALS — BP 134/84 | HR 76 | Temp 97.9°F | Ht 64.0 in | Wt 244.6 lb

## 2024-01-06 DIAGNOSIS — Z23 Encounter for immunization: Secondary | ICD-10-CM | POA: Diagnosis not present

## 2024-01-06 DIAGNOSIS — Z0000 Encounter for general adult medical examination without abnormal findings: Secondary | ICD-10-CM | POA: Diagnosis not present

## 2024-01-06 DIAGNOSIS — E782 Mixed hyperlipidemia: Secondary | ICD-10-CM | POA: Diagnosis not present

## 2024-01-06 DIAGNOSIS — D508 Other iron deficiency anemias: Secondary | ICD-10-CM | POA: Diagnosis not present

## 2024-01-06 DIAGNOSIS — E119 Type 2 diabetes mellitus without complications: Secondary | ICD-10-CM | POA: Diagnosis not present

## 2024-01-06 DIAGNOSIS — Z1231 Encounter for screening mammogram for malignant neoplasm of breast: Secondary | ICD-10-CM

## 2024-01-06 DIAGNOSIS — I10 Essential (primary) hypertension: Secondary | ICD-10-CM

## 2024-01-06 DIAGNOSIS — Z7984 Long term (current) use of oral hypoglycemic drugs: Secondary | ICD-10-CM | POA: Diagnosis not present

## 2024-01-06 DIAGNOSIS — E538 Deficiency of other specified B group vitamins: Secondary | ICD-10-CM

## 2024-01-06 LAB — COMPREHENSIVE METABOLIC PANEL WITH GFR
ALT: 22 U/L (ref 0–35)
AST: 21 U/L (ref 0–37)
Albumin: 4.3 g/dL (ref 3.5–5.2)
Alkaline Phosphatase: 95 U/L (ref 39–117)
BUN: 14 mg/dL (ref 6–23)
CO2: 30 meq/L (ref 19–32)
Calcium: 9.7 mg/dL (ref 8.4–10.5)
Chloride: 102 meq/L (ref 96–112)
Creatinine, Ser: 0.67 mg/dL (ref 0.40–1.20)
GFR: 101.68 mL/min (ref >60.00)
Glucose, Bld: 104 mg/dL — ABNORMAL HIGH (ref 70–99)
Potassium: 3.7 meq/L (ref 3.5–5.1)
Sodium: 141 meq/L (ref 135–145)
Total Bilirubin: 0.3 mg/dL (ref 0.2–1.2)
Total Protein: 7.3 g/dL (ref 6.0–8.3)

## 2024-01-06 LAB — LIPID PANEL
Cholesterol: 158 mg/dL (ref 0–200)
HDL: 46 mg/dL (ref >39.00)
LDL Cholesterol: 68 mg/dL (ref 0–99)
NonHDL: 112.13
Total CHOL/HDL Ratio: 3
Triglycerides: 222 mg/dL — ABNORMAL HIGH (ref 0.0–149.0)
VLDL: 44.4 mg/dL — ABNORMAL HIGH (ref 0.0–40.0)

## 2024-01-06 LAB — CBC
HCT: 39.5 % (ref 36.0–46.0)
Hemoglobin: 13.4 g/dL (ref 12.0–15.0)
MCHC: 34 g/dL (ref 30.0–36.0)
MCV: 86.7 fl (ref 78.0–100.0)
Platelets: 246 K/uL (ref 150.0–400.0)
RBC: 4.56 Mil/uL (ref 3.87–5.11)
RDW: 13.4 % (ref 11.5–15.5)
WBC: 8.5 K/uL (ref 4.0–10.5)

## 2024-01-06 LAB — VITAMIN B12: Vitamin B-12: 487 pg/mL (ref 211–911)

## 2024-01-06 LAB — HEMOGLOBIN A1C: Hgb A1c MFr Bld: 6.1 % (ref 4.6–6.5)

## 2024-01-06 MED ORDER — RYBELSUS 3 MG PO TABS: 3.0000 mg | ORAL_TABLET | Freq: Every day | ORAL | 0 refills | Status: DC

## 2024-01-06 NOTE — Patient Instructions (Signed)
  I have sent an electronic order over to your preferred location for the following:   []   2D Mammogram  [x]   3D Mammogram  []   Bone Density   Please give this center a call to get scheduled at your convenience.  [x]   [facility]  [address] [place] [address]  [phone]  Make sure to wear two piece  clothing  No lotions powders or deodorants the day of the appointment Make sure to bring picture ID and insurance card.  Bring list of medications you are currently taking including any supplements.   ------------------------------------

## 2024-01-06 NOTE — Progress Notes (Unsigned)
 Subjective:  Patient ID: Nicole Cervantes, female    DOB: 1975/02/22  Age: 48 y.o. MRN: [id number]  Patient Care Team: Corwin Antu, FNP as PCP - General (Family Medicine)   CC:  Chief Complaint  Patient presents with  . Annual Exam    HPI Nicole Cervantes is a 48 y.o. female who presents today for an annual physical exam. She reports consuming a general diet. Starting to exercise does this at least once weekly on her ellipitcal She generally feels well. She reports sleeping fairly well. She does not have additional problems to discuss today.   Vision:Within last year Dental:Receives regular dental care  Lung Cancer Screening with low-dose Chest CT: quit 2009   Mammogram: 2022 will order Last pap: had a hysterectomy no cervix  Colonoscopy: 12/26/20 repeat 7 years   Pt is without acute concerns.   Advanced Directives Patient does not have advanced directives     DEPRESSION SCREENING    01/06/2024   10:57 AM 07/07/2023   11:39 AM 05/11/2023   10:35 AM 02/09/2023    9:03 AM 10/16/2022    7:22 AM 10/07/2022   11:20 AM 07/03/2022   10:34 AM  PHQ 2/9 Scores  PHQ - 2 Score 0 0   0    PHQ- 9 Score 8 3    6         Information is confidential and restricted. Go to Review Flowsheets to unlock data.   Data saved with a previous flowsheet row definition     ROS: Negative unless specifically indicated above in HPI.    Current Outpatient Medications:  .  Semaglutide  (RYBELSUS ) 3 MG TABS, Take 1 tablet (3 mg total) by mouth daily., Disp: 30 tablet, Rfl: 0 .  ACCU-CHEK GUIDE test strip, USE TO CHECK BLOOD SUGAR UP TO 4 TIMES DAILY AS DIRECTED, Disp: , Rfl:  .  Accu-Chek Softclix Lancets lancets, SMARTSIG:Topical 1-4 Times Daily, Disp: , Rfl:  .  acetaminophen  (TYLENOL ) 500 MG tablet, Take 500-1,000 mg by mouth every 6 (six) hours as needed (pain.)., Disp: , Rfl:  .  amLODipine  (NORVASC ) 10 MG tablet, Take 1 tablet by mouth once daily, Disp: 90 tablet, Rfl: 3 .  atorvastatin  (LIPITOR) 10 MG  tablet, Take 1 tablet by mouth once daily, Disp: 90 tablet, Rfl: 0 .  blood glucose meter kit and supplies, Dispense based on patient and insurance preference. Use up to four times daily as directed. (FOR ICD-10 E10.9, E11.9)., Disp: 1 each, Rfl: 0 .  buPROPion  (WELLBUTRIN ) 75 MG tablet, Take 1 tablet (75 mg total) by mouth every morning., Disp: 90 tablet, Rfl: 1 .  clobetasol  cream (TEMOVATE ) 0.05 %, Apply 1 Application topically 2 (two) times daily., Disp: 30 g, Rfl: 11 .  clotrimazole -betamethasone  (LOTRISONE ) cream, Apply externally BID prn sx up to 2 wks, Disp: 15 g, Rfl: 1 .  losartan  (COZAAR ) 50 MG tablet, Take 1 tablet (50 mg total) by mouth daily., Disp: , Rfl:  .  metFORMIN  (GLUCOPHAGE ) 500 MG tablet, Take 1 tablet (500 mg total) by mouth 2 (two) times daily with a meal., Disp: 180 tablet, Rfl: 1 .  methocarbamol  (ROBAXIN ) 500 MG tablet, Take 1 tablet (500 mg total) by mouth 4 (four) times daily., Disp: 120 tablet, Rfl: 0 .  metoprolol  tartrate (LOPRESSOR ) 100 MG tablet, Take 1 tablet (100 mg total) by mouth once for 1 dose., Disp: 1 tablet, Rfl: 0 .  pantoprazole  (PROTONIX ) 20 MG tablet, Take 1 tablet by mouth once daily,  Disp: 90 tablet, Rfl: 0 .  rOPINIRole  (REQUIP ) 0.25 MG tablet, TAKE 1 TABLET BY MOUTH AT BEDTIME, Disp: 90 tablet, Rfl: 0 .  sertraline  (ZOLOFT ) 100 MG tablet, Take 2 tablets by mouth once daily, Disp: 180 tablet, Rfl: 3 .  tacrolimus  (PROTOPIC ) 0.1 % ointment, APPLY 2 grams TO AFFECTED AREAS OF RASH TWICE DAILY UNTIL IMPROVED, Disp: 60 g, Rfl: 2 .  traZODone  (DESYREL ) 50 MG tablet, Take 0.5-1 tablets (25-50 mg total) by mouth at bedtime as needed for sleep., Disp: 90 tablet, Rfl: 3    Objective:    BP 134/84 (BP Location: Left Arm, Patient Position: Sitting, Cuff Size: Large)   Pulse 76   Temp 97.9 F (36.6 C) (Temporal)   Ht 5' 4 (1.626 m)   Wt 244 lb 9.6 oz (110.9 kg)   SpO2 98%   BMI 41.99 kg/m   BP Readings from Last 3 Encounters:  01/06/24 134/84   12/01/23 130/82  10/19/23 113/76      Physical Exam Vitals reviewed.  Constitutional:      General: She is not in acute distress.    Appearance: Normal appearance. She is obese. She is not ill-appearing.  HENT:     Head: Normocephalic.     Right Ear: Tympanic membrane normal.     Left Ear: Tympanic membrane normal.     Nose: Nose normal.     Mouth/Throat:     Mouth: Mucous membranes are moist.  Eyes:     Extraocular Movements: Extraocular movements intact.     Pupils: Pupils are equal, round, and reactive to light.  Cardiovascular:     Rate and Rhythm: Normal rate and regular rhythm.  Pulmonary:     Effort: Pulmonary effort is normal.     Breath sounds: Normal breath sounds.  Abdominal:     General: Abdomen is flat. Bowel sounds are normal.     Palpations: Abdomen is soft.     Tenderness: There is no guarding or rebound.  Musculoskeletal:        General: Normal range of motion.     Cervical back: Normal range of motion.  Skin:    General: Skin is warm.     Capillary Refill: Capillary refill takes less than 2 seconds.  Neurological:     General: No focal deficit present.     Mental Status: She is alert.  Psychiatric:        Mood and Affect: Mood normal.        Behavior: Behavior normal.        Thought Content: Thought content normal.        Judgment: Judgment normal.    Title   Diabetic Foot Exam - detailed Is there a history of foot ulcer?: No Is there a foot ulcer now?: No Is there swelling?: No Is there elevated skin temperature?: No Is there abnormal foot shape?: No Is there a claw toe deformity?: No Are the toenails long?: No Are the toenails thick?: No Are the toenails ingrown?: No Is the skin thin, fragile, shiny and hairless?: No Normal Range of Motion?: Yes Is there foot or ankle muscle weakness?: No Do you have pain in calf while walking?: No Are the shoes appropriate in style and fit?: Yes Can the patient see the bottom of their feet?:  Yes Pulse Foot Exam completed.: Yes   Right Posterior Tibialis: Present Left posterior Tibialis: Present   Right Dorsalis Pedis: Present Left Dorsalis Pedis: Present     Semmes-Weinstein Monofilament Test +  means has sensation and - means no sensation  R Foot Test Control: Pos    R Site 1-Great Toe: Pos L Site 1-Great Toe: Pos   R Site 4: Pos L Site 4: Pos   R site 5: Pos L Site 5: Pos  R Site 6: Pos L Site 6: Pos     Image components are not supported.   Image components are not supported. Image components are not supported.  [place] Comments       Results       Assessment & Plan:   Assessment and Plan Assessment & Plan         Follow-up: Return in about 3 months (around 04/05/2024) for f/u weight loss medication.   Ginger Patrick, FNP

## 2024-01-07 ENCOUNTER — Ambulatory Visit: Payer: Self-pay | Admitting: Family

## 2024-01-11 ENCOUNTER — Ambulatory Visit: Payer: Self-pay | Admitting: Neurosurgery

## 2024-01-11 ENCOUNTER — Other Ambulatory Visit: Payer: Self-pay

## 2024-01-11 ENCOUNTER — Ambulatory Visit: Admitting: Neurosurgery

## 2024-01-11 VITALS — BP 118/82 | Wt 240.4 lb

## 2024-01-11 DIAGNOSIS — G5602 Carpal tunnel syndrome, left upper limb: Secondary | ICD-10-CM | POA: Diagnosis not present

## 2024-01-11 DIAGNOSIS — Z01818 Encounter for other preprocedural examination: Secondary | ICD-10-CM

## 2024-01-11 NOTE — Patient Instructions (Signed)
 Please see below for information in regards to your upcoming surgery:   Planned surgery: Left Side Carpal Tunnel Release with Ultrasound Guidance   Surgery date: 02/18/24 at [facility] (Medical Mall: [address], [place], [place] [address]) - you will find out your arrival time the business day before your surgery.   Pre-op appointment at [facility] Pre-admit Testing: you will receive a call with a date/time for this appointment. If you are scheduled for an in person appointment, Pre-admit Testing is located on the first floor of the [facility] building, [address], [address]. During this appointment, they will advise you which medications you can take the morning of surgery, and which medications you will need to hold for surgery. Labs (such as blood work, EKG) may be done at your pre-op appointment. You are not required to fast for these labs. Should you need to change your pre-op appointment, please call Pre-admit testing at [phone].     Diabetes/heart failure/kidney disease/weight loss medications that require an extended hold: Per anesthesia guidelines (due to the increased risk of aspiration caused by delayed gastric emptying):  Metformin : hold for 2 days prior to surgery Semaglutide  oral tablets: hold for 1 day prior to surgery     Surgical clearance: we will send a clearance form to Ginger Patrick, FNP. They may wish to see you in their office prior to signing the clearance form. If so, they may call you to schedule an appointment.     How to contact us :  If you have any questions/concerns before or after surgery, you can reach us  at [phone], or you can send a mychart message. We can be reached by phone or mychart 8am-4pm, Monday-Friday.  *Please note: Calls after 4pm are forwarded to a third party answering service. Mychart messages are not routinely monitored during evenings, weekends, and holidays. Please call our office to  contact the answering service for urgent concerns during non-business hours.    If you have FMLA/disability paperwork, please drop it off or fax it to [phone]   Appointments/FMLA & disability paperwork: Reche Hait, & Nichole Registered Nurse/Surgery scheduler: Jahmil Macleod, RN & Katie, RN Certified Medical Assistants: Don, CMA, Elenor, CMA, Damien, CMA, & [place]on, [place] Physician Assistants: Lyle Decamp, PA-C, Edsel Goods, PA-C & Glade Boys, PA-C Surgeons: Penne Sharps, MD & Reeves Daisy, MD   [facility] PREADMIT TESTING VISIT and SURGERY INFORMATION SHEET   Now that surgery has been scheduled you can anticipate several phone calls from [facility] services. A pharmacy technician will call you to verify your current list of medications taken at home.               The Pre-Service Center will call to verify your insurance information and to give you billing estimates and information.             The Preadmit Testing Office will be calling to schedule a visit to obtain information for the anesthesia team and provide instructions on preparation for surgery.  What can you expect for the Preadmit Testing Visit: Appointments may be scheduled in-person or by telephone.  If a telephone visit is scheduled, you may be asked to come into the office to have lab tests or other studies performed.   This visit will not be completed any greater than 14 days prior to your surgery.  If your surgery has been scheduled for a future date, please do not be alarmed if we have not contacted  you to schedule an appointment more than a month prior to the surgery date.    Please be prepared to provide the following information during this appointment:            -Personal medical history                                               -Medication and allergy list            -Any history of problems with anesthesia              -Recent lab work or diagnostic studies             -Please notify us  of any needs we should be aware of to provide the best care possible           -You will be provided with instructions on how to prepare for your surgery.    On The Day of Surgery:  You must have a driver to take you home after surgery, you will be asked not to drive for 24 hours following surgery.  Taxi, Gisele and non-medical transport will not be acceptable means of transportation unless you have a responsible individual who will be traveling with you.  Visitors in the surgical area:   2 people will be able to visit you in your room once your preparation for surgery has been completed. During surgery, your visitors will be asked to wait in the Surgery Waiting Area.  It is not a requirement for them to stay, if they prefer to leave and come back.  Your visitor(s) will be given an update once the surgery has been completed.  No visitors are allowed in the initial recovery room to respect patient privacy and safety.  Once you are more awake and transfer to the secondary recovery area, or are transferred to an inpatient room, visitors will again be able to see you.  To respect and protect your privacy: We will ask on the day of surgery who your driver will be and what the contact number for that individual will be. We will ask if it is okay to share information with this individual, or if there is an alternative individual that we, or the surgeon, should contact to provide updates and information. If family or friends come to the surgical information desk requesting information about you, who you have not listed with us , no information will be given.   It may be helpful to designate someone as the main contact who will be responsible for updating your other friends and family.    PREADMIT TESTING OFFICE: [phone] SAME DAY SURGERY: [phone] We look forward to caring for you before and throughout the process of your surgery.

## 2024-01-13 LAB — GENECONNECT MOLECULAR SCREEN: Genetic Analysis Overall Interpretation: NEGATIVE

## 2024-01-14 ENCOUNTER — Encounter: Payer: Self-pay | Admitting: *Deleted

## 2024-01-19 ENCOUNTER — Other Ambulatory Visit: Payer: Self-pay | Admitting: Family

## 2024-01-19 DIAGNOSIS — E119 Type 2 diabetes mellitus without complications: Secondary | ICD-10-CM

## 2024-01-19 NOTE — Telephone Encounter (Signed)
 Called patient to make aware that she will need in-person visit with PCP. Provided office telephone number. Patient to reach out to make appointment.

## 2024-01-20 ENCOUNTER — Ambulatory Visit: Admitting: Family

## 2024-01-20 ENCOUNTER — Encounter: Payer: Self-pay | Admitting: Family

## 2024-01-20 VITALS — BP 120/86 | HR 85 | Temp 98.5°F | Ht 64.0 in | Wt 239.0 lb

## 2024-01-20 DIAGNOSIS — I10 Essential (primary) hypertension: Secondary | ICD-10-CM | POA: Diagnosis not present

## 2024-01-20 DIAGNOSIS — I2510 Atherosclerotic heart disease of native coronary artery without angina pectoris: Secondary | ICD-10-CM | POA: Diagnosis not present

## 2024-01-20 DIAGNOSIS — I517 Cardiomegaly: Secondary | ICD-10-CM | POA: Diagnosis not present

## 2024-01-20 DIAGNOSIS — E119 Type 2 diabetes mellitus without complications: Secondary | ICD-10-CM | POA: Diagnosis not present

## 2024-01-20 DIAGNOSIS — Z6841 Body Mass Index (BMI) 40.0 and over, adult: Secondary | ICD-10-CM | POA: Diagnosis not present

## 2024-01-20 DIAGNOSIS — E66813 Obesity, class 3: Secondary | ICD-10-CM

## 2024-01-20 NOTE — Progress Notes (Signed)
 "  Assessment & Plan:   Assessment and Plan Assessment & Plan Acute upper respiratory infection Symptoms began yesterday with cough, sore throat, headache, sinus congestion, and body aches. Negative for flu and COVID. Likely viral etiology given recent onset and negative tests. Strep test pending. Symptoms expected to worsen by day 3-5 and improve by day 6-7. - Performed strep test - Advised supportive care - Scheduled follow-up on December 26th, 2025, for reassessment and further testing if symptoms persist  Type 2 diabetes mellitus Currently managed with metformin  and Rybelsus . Reports weight loss of five pounds and good tolerance of Rybelsus . - Continue current diabetes management regimen  Essential hypertension Blood pressure well-controlled at 120/86 mmHg. - Continue current antihypertensive regimen  Obstructive sleep apnea Managed with CPAP. Reports improvement in symptoms since starting CPAP. - Continue CPAP therapy      subjective:    History of Present Illness Nicole Cervantes is a 48 year old female with coronary artery disease and sleep apnea who presents with symptoms of an upper respiratory infection.  She has been experiencing symptoms of an upper respiratory infection since yesterday, including a persistent cough, sore throat, headache, nasal congestion, and sinus pressure. Mucinex has been effective in reducing her cough, but she continues to feel achy and fatigued. No chest pain, angina, or palpitations. She confirms having a sore throat, cough, and sinus pressure. Her right ear frequently bothers her. She has been tested for flu and COVID, both of which are negative so far.  She has sleep apnea and uses a CPAP machine, which has improved her breathing and reduced her headaches.  She is currently taking Rybelsus  and has lost five pounds since starting the medication, which she is tolerating well. She is also on metformin .         Social history:  Relevant  past medical, surgical, family and social history reviewed and updated as indicated. Interim medical history since our last visit reviewed.  Allergies and medications reviewed and updated.  DATA REVIEWED: CHART IN EPIC  ROS: Negative unless specifically indicated above in HPI.   Current Medications[1]      Objective:    BP 120/86 (BP Location: Right Arm, Patient Position: Sitting, Cuff Size: Large)   Pulse 85   Temp 98.5 F (36.9 C) (Temporal)   Ht 5' 4 (1.626 m)   Wt 239 lb (108.4 kg)   SpO2 98%   BMI 41.02 kg/m   Wt Readings from Last 3 Encounters:  01/20/24 239 lb (108.4 kg)  01/11/24 240 lb 6.4 oz (109 kg)  01/06/24 244 lb 9.6 oz (110.9 kg)    Physical Exam VITALS: P- 67, BP- 120/86 HEENT: Right ear with fluid, not infected. NECK: Enlarged lymph node in neck, non-tender.       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  Diagnostic Echocardiogram (08/27/2023): Normal left ventricular function, ejection fraction 60-65%, mild tricuspid regurgitation. EKG (09/22/2023): Normal sinus rhythm, rate 67, QT interval 420 ms, PR interval 176 ms, T wave inversion in V1, no ST changes.       [1]  Current Outpatient Medications:    ACCU-CHEK GUIDE test strip, USE TO CHECK BLOOD SUGAR UP TO 4  TIMES DAILY AS DIRECTED, Disp: , Rfl:    Accu-Chek Softclix Lancets lancets, SMARTSIG:Topical 1-4 Times Daily, Disp: , Rfl:    acetaminophen  (TYLENOL ) 500 MG tablet, Take 500-1,000 mg by mouth every 6 (six) hours as needed (pain.)., Disp: , Rfl:    amLODipine   (NORVASC ) 10 MG tablet, Take 1 tablet by mouth once daily, Disp: 90 tablet, Rfl: 3   atorvastatin  (LIPITOR) 10 MG tablet, Take 1 tablet by mouth once daily, Disp: 90 tablet, Rfl: 0   blood glucose meter kit and supplies, Dispense based on patient and insurance preference. Use up to four times daily as directed. (FOR ICD-10 E10.9, E11.9)., Disp: 1 each, Rfl: 0   buPROPion  (WELLBUTRIN ) 75 MG tablet, Take 1 tablet (75 mg total) by mouth every morning., Disp: 90 tablet, Rfl: 1   clobetasol  cream (TEMOVATE ) 0.05 %, Apply 1 Application topically 2 (two) times daily., Disp: 30 g, Rfl: 11   clotrimazole -betamethasone  (LOTRISONE ) cream, Apply externally BID prn sx up to 2 wks, Disp: 15 g, Rfl: 1   losartan  (COZAAR ) 50 MG tablet, Take 1 tablet (50 mg total) by mouth daily., Disp: , Rfl:    metFORMIN  (GLUCOPHAGE ) 500 MG tablet, TAKE 1 TABLET BY MOUTH TWICE DAILY WITH A MEAL, Disp: 180 tablet, Rfl: 0   methocarbamol  (ROBAXIN ) 500 MG tablet, Take 1 tablet (500 mg total) by mouth 4 (four) times daily., Disp: 120 tablet, Rfl: 0   metoprolol  tartrate (LOPRESSOR ) 100 MG tablet, Take 1 tablet (100 mg total) by mouth once for 1 dose., Disp: 1 tablet, Rfl: 0   pantoprazole  (PROTONIX ) 20 MG tablet, Take 1 tablet by mouth once daily, Disp: 90 tablet, Rfl: 0   rOPINIRole  (REQUIP ) 0.25 MG tablet, TAKE 1 TABLET BY MOUTH AT BEDTIME, Disp: 90 tablet, Rfl: 0   Semaglutide  (RYBELSUS ) 3 MG TABS, Take 1 tablet (3 mg total) by mouth daily., Disp: 30 tablet, Rfl: 0   sertraline  (ZOLOFT ) 100 MG tablet, Take 2 tablets by mouth once daily, Disp: 180 tablet, Rfl: 3   tacrolimus  (PROTOPIC ) 0.1 % ointment, APPLY 2 grams TO AFFECTED AREAS OF RASH TWICE DAILY UNTIL IMPROVED, Disp: 60 g, Rfl: 2   traZODone  (DESYREL ) 50 MG tablet, Take 0.5-1 tablets (25-50 mg total) by mouth at bedtime as needed for sleep., Disp: 90 tablet, Rfl: 3  "

## 2024-01-21 ENCOUNTER — Telehealth (HOSPITAL_BASED_OUTPATIENT_CLINIC_OR_DEPARTMENT_OTHER): Payer: Self-pay

## 2024-01-21 NOTE — Telephone Encounter (Signed)
° °  Pre-operative Risk Assessment    Patient Name: Nicole Cervantes  DOB: 10/01/1975 MRN: [id number]   Date of last office visit: 09/22/23 with [place] Date of next office visit: NA  Request for Surgical Clearance    Procedure:  Left Side Carpal Tunnel Releaase with Ultrasound Guidance   Date of Surgery:  Clearance 02/18/24                                  Surgeon:  Dr. Claudene Socks Group or Practice Name:  [facility]  Phone number:  [phone] Fax number:  [phone]   Type of Clearance Requested:   - Medical    Type of Anesthesia:  MAC   Additional requests/questions:    SignedAugustin JONETTA Daring   01/21/2024, 10:39 AM

## 2024-01-21 NOTE — Telephone Encounter (Signed)
° °  Name: Nicole Cervantes  DOB: 1975-12-03  MRN: [id number]  Primary Cardiologist: None   Preoperative team, please contact this patient and set up a phone call appointment for further preoperative risk assessment. Please obtain consent and complete medication review. Thank you for your help.  I confirm that guidance regarding antiplatelet and oral anticoagulation therapy has been completed and, if necessary, noted below.  None requested.   I also confirmed the patient resides in the state of [place] . As per [facility] Medical Board telemedicine laws, the patient must reside in the state in which the provider is licensed.    Barnie Hila, NP 01/21/2024, 11:30 AM [facility]

## 2024-01-21 NOTE — Telephone Encounter (Signed)
 Left message to call back to schedule tele pre op appt.

## 2024-01-22 NOTE — Telephone Encounter (Signed)
 2nd attempt to reach pt regarding surgical clearance and the need for an TELE appointment.  Left pt a detailed message to call back and get that scheduled.

## 2024-01-25 ENCOUNTER — Telehealth: Payer: Self-pay | Admitting: *Deleted

## 2024-01-25 NOTE — Telephone Encounter (Signed)
 Pre Op tele appointment now scheduled med rec and consent done.

## 2024-01-25 NOTE — Telephone Encounter (Signed)
"  °  Patient Consent for Virtual Visit        Nicole Cervantes has provided verbal consent on 01/25/2024 for a virtual visit (video or telephone).   CONSENT FOR VIRTUAL VISIT FOR:  Nicole Cervantes  By participating in this virtual visit I agree to the following:  I hereby voluntarily request, consent and authorize [facility] and its employed or contracted physicians, physician assistants, nurse practitioners or other licensed health care professionals (the Practitioner), to provide me with telemedicine health care services (the Services) as deemed necessary by the treating Practitioner. I acknowledge and consent to receive the Services by the Practitioner via telemedicine. I understand that the telemedicine visit will involve communicating with the Practitioner through live audiovisual communication technology and the disclosure of certain medical information by electronic transmission. I acknowledge that I have been given the opportunity to request an in-person assessment or other available alternative prior to the telemedicine visit and am voluntarily participating in the telemedicine visit.  I understand that I have the right to withhold or withdraw my consent to the use of telemedicine in the course of my care at any time, without affecting my right to future care or treatment, and that the Practitioner or I may terminate the telemedicine visit at any time. I understand that I have the right to inspect all information obtained and/or recorded in the course of the telemedicine visit and may receive copies of available information for a reasonable fee.  I understand that some of the potential risks of receiving the Services via telemedicine include:  Delay or interruption in medical evaluation due to technological equipment failure or disruption; Information transmitted may not be sufficient (e.g. poor resolution of images) to allow for appropriate medical decision making by the Practitioner;  and/or  In rare instances, security protocols could fail, causing a breach of personal health information.  Furthermore, I acknowledge that it is my responsibility to provide information about my medical history, conditions and care that is complete and accurate to the best of my ability. I acknowledge that Practitioner's advice, recommendations, and/or decision may be based on factors not within their control, such as incomplete or inaccurate data provided by me or distortions of diagnostic images or specimens that may result from electronic transmissions. I understand that the practice of medicine is not an exact science and that Practitioner makes no warranties or guarantees regarding treatment outcomes. I acknowledge that a copy of this consent can be made available to me via my patient portal [facility] MyChart), or I can request a printed copy by calling the office of [facility].    I understand that my insurance will be billed for this visit.   I have read or had this consent read to me. I understand the contents of this consent, which adequately explains the benefits and risks of the Services being provided via telemedicine.  I have been provided ample opportunity to ask questions regarding this consent and the Services and have had my questions answered to my satisfaction. I give my informed consent for the services to be provided through the use of telemedicine in my medical care    "

## 2024-01-29 ENCOUNTER — Ambulatory Visit: Admitting: Family

## 2024-01-29 ENCOUNTER — Encounter: Payer: Self-pay | Admitting: Family

## 2024-01-29 VITALS — BP 114/82 | HR 61 | Temp 98.1°F

## 2024-01-29 DIAGNOSIS — R311 Benign essential microscopic hematuria: Secondary | ICD-10-CM

## 2024-01-29 DIAGNOSIS — I517 Cardiomegaly: Secondary | ICD-10-CM | POA: Diagnosis not present

## 2024-01-29 DIAGNOSIS — E119 Type 2 diabetes mellitus without complications: Secondary | ICD-10-CM | POA: Diagnosis not present

## 2024-01-29 DIAGNOSIS — Z01818 Encounter for other preprocedural examination: Secondary | ICD-10-CM

## 2024-01-29 DIAGNOSIS — Z7985 Long-term (current) use of injectable non-insulin antidiabetic drugs: Secondary | ICD-10-CM

## 2024-01-29 DIAGNOSIS — I10 Essential (primary) hypertension: Secondary | ICD-10-CM

## 2024-01-29 DIAGNOSIS — Z7984 Long term (current) use of oral hypoglycemic drugs: Secondary | ICD-10-CM

## 2024-01-29 LAB — POC URINALSYSI DIPSTICK (AUTOMATED)
Glucose, UA: NEGATIVE
Ketones, UA: NEGATIVE
Leukocytes, UA: NEGATIVE
Nitrite, UA: NEGATIVE
Protein, UA: POSITIVE — AB
Spec Grav, UA: 1.025
Urobilinogen, UA: 0.2 U/dL
pH, UA: 6

## 2024-01-29 LAB — CBC
HCT: 38.7 % (ref 36.0–46.0)
Hemoglobin: 13.1 g/dL (ref 12.0–15.0)
MCHC: 33.9 g/dL (ref 30.0–36.0)
MCV: 85.4 fl (ref 78.0–100.0)
Platelets: 291 K/uL (ref 150.0–400.0)
RBC: 4.53 Mil/uL (ref 3.87–5.11)
RDW: 13.2 % (ref 11.5–15.5)
WBC: 8.2 K/uL (ref 4.0–10.5)

## 2024-01-29 LAB — URINALYSIS, ROUTINE W REFLEX MICROSCOPIC
Bilirubin Urine: NEGATIVE
Hgb urine dipstick: NEGATIVE
Ketones, ur: NEGATIVE
Leukocytes,Ua: NEGATIVE
Nitrite: NEGATIVE
Specific Gravity, Urine: 1.025 (ref 1.000–1.030)
Total Protein, Urine: 100 — AB
Urine Glucose: NEGATIVE
Urobilinogen, UA: 1 (ref 0.0–1.0)
pH: 6 (ref 5.0–8.0)

## 2024-01-29 LAB — COMPREHENSIVE METABOLIC PANEL WITH GFR
ALT: 18 U/L (ref 3–35)
AST: 14 U/L (ref 5–37)
Albumin: 4.5 g/dL (ref 3.5–5.2)
Alkaline Phosphatase: 84 U/L (ref 39–117)
BUN: 16 mg/dL (ref 6–23)
CO2: 27 meq/L (ref 19–32)
Calcium: 9.9 mg/dL (ref 8.4–10.5)
Chloride: 103 meq/L (ref 96–112)
Creatinine, Ser: 0.82 mg/dL (ref 0.40–1.20)
GFR: 83.17 mL/min
Glucose, Bld: 117 mg/dL — ABNORMAL HIGH (ref 70–99)
Potassium: 3.9 meq/L (ref 3.5–5.1)
Sodium: 140 meq/L (ref 135–145)
Total Bilirubin: 0.3 mg/dL (ref 0.2–1.2)
Total Protein: 7.9 g/dL (ref 6.0–8.3)

## 2024-01-29 LAB — PROTIME-INR
INR: 1.1 ratio — ABNORMAL HIGH (ref 0.8–1.0)
Prothrombin Time: 11.9 s (ref 9.6–13.1)

## 2024-01-29 MED ORDER — RYBELSUS 3 MG PO TABS: 3.0000 mg | ORAL_TABLET | Freq: Every day | ORAL | 0 refills | Status: AC

## 2024-01-29 NOTE — Progress Notes (Signed)
 "  Assessment & Plan:   Assessment and Plan Assessment & Plan  Hypertension and cardiomegaly last visit with cardiology 09/22/23   Preoperative evaluation for left carpal tunnel release Scheduled for left-sided carpal tunnel release with ultrasound guidance on January 15th, 2026. Preoperative clearance pending with cardiology. No history of myocardial infarction or heart failure. Cardiomegaly noted but not significant. EKG within 30 days required due to diabetes, coronary artery disease, or hypertension. Labs within 90 days required. - Hold metformin  two days prior to surgery. - Hold semaglutide  one day prior to surgery. - Ensure preoperative clearance with cardiology scheduled for 02/09/24 - EKG in office today - labs ordered pending for clearance - Instructed to leave a urine sample to check for UTI to rule out infection   Type 2 diabetes mellitus Currently managed with semaglutide  (Rybelsus ) and metformin . A1c is well-controlled at 6.1. Weight loss of 12 pounds noted with semaglutide . Plan to adjust metformin  dosage based on weight and A1c levels. - Continue semaglutide  (Rybelsus ) 3 mg daily. - Will consider reducing metformin  to once daily if weight and A1c remain stable. - Will repeat A1c in three months.  Essential hypertension  Cardiomegaly Noted but not significant. No current cardiologist follow-up. Preoperative clearance pending with cardiology. - Ensure preoperative clearance with cardiology.      I have independently evaluated patient.  Nicole Cervantes is a 48 y.o. female who is above average risk for a moderate risk surgery.  There are not modifiable risk factors (smoking, etc). Nicole Cervantes's RCRI/NSQIP calculation for MACE is: 2.   ASCVD: 2.3%  Review meds: If indicated, NO ACE-I/ARB day of surgery. OK to do BB or statin day of (esp if vascular sx)  No follow-ups on file.  Ginger Patrick, MSN, APRN, FNP-C [facility]  Subjective:       HPI: Pt is a 48 y.o. female who is here for preoperative clearance for left side carpal tunnel release with ultrasound guidance  with general anesthesia, surgeon is Dr. Claudene. Surgery is scheduled for 02/18/24.   Discussed the use of AI scribe software for clinical note transcription with the patient, who gave verbal consent to proceed.  History of Present Illness Nicole Cervantes is a 48 year old female with diabetes who presents for medication management and preoperative evaluation for left-sided carpal tunnel release.  She has been taking Rybelsus  for the past month, which has resulted in a weight loss of 12 pounds. She tolerates the medication well and wishes to continue it. Her hemoglobin A1c has improved to 6.1. She is considering continuing metformin  twice a day in conjunction with Rybelsus .  She is scheduled for left-sided carpal tunnel release with ultrasound guidance on February 18, 2024. She is unsure about the type of anesthesia but compares it to the sedation used during a colonoscopy.  No recent chest pain, and she can walk upstairs and run without issues. She reports that she has not had a stress test. She has a preoperative phone call scheduled with a cardiologist for further risk assessment, though she does not recall the cardiologist's name.  She mentions feeling bloated after eating large meals during the holidays, particularly enjoying Mexican food provided by her neighbors. She has been mindful of her diet due to her current medication.  She lives with her boyfriend and has two children who spend time with their father. 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   1) High Risk Cardiac Conditions:  1) Recent MI -  No.  2) Decompensated Heart Failure - No.  3) Unstable angina - No.  4) Symptomatic arrythmia - No.  5) Sx Valvular Disease - No.  2) Intermediate Risk Factors: DM, CKD, CVA, CHF, CAD - Yes.    2) Functional Status: > 4 mets (Walk, run, climb  stairs) Yes.  Nicole Cervantes Activity Status Index: 9.89  3) Surgery Specific Risk:           Intermediate (Carotid, Head and Neck, Orthopaedic )           4) Further Noninvasive evaluation:   1) EKG - Yes.     Hx of MI, CVA, CAD, DM, CKD  2) Echo - No. Already completed    Worsening dyspnea or CHF without an echo in the past year  3) Stress Testing - Active Cardiac Disease - No.  4) CXR No.   If asymptomatic, healthy, no respiratory symptoms it is not needed  5)PFTs No.   OSA, OHS, or significant cardiopulmonary history  5) Need for medical therapy - Beta Blocker, Statins indicated ? No.      Social history:  Relevant past medical, surgical, family and social history reviewed and updated as indicated. Interim medical history since our last visit reviewed.  Allergies and medications reviewed and updated.  DATA REVIEWED: CHART IN EPIC  ROS: Negative unless specifically indicated above in HPI.   Current Medications[1]      Objective:    BP 114/82 (BP Location: Left Arm, Patient Position: Sitting, Cuff Size: Large)   Pulse 61   Temp 98.1 F (36.7 C) (Oral)   SpO2 96%   Wt Readings from Last 3 Encounters:  01/20/24 239 lb (108.4 kg)  01/11/24 240 lb 6.4 oz (109 kg)  01/06/24 244 lb 9.6 oz (110.9 kg)    Physical Exam  Physical Exam Constitutional:      General: She is not in acute distress.    Appearance: Normal appearance. She is normal weight. She is not ill-appearing.  HENT:     Head: Normocephalic.     Right Ear: Tympanic membrane normal.     Left Ear: Tympanic membrane normal.     Nose: Nose normal.     Mouth/Throat:     Mouth: Mucous membranes are moist.  Eyes:     Extraocular Movements: Extraocular movements intact.     Pupils: Pupils are equal, round, and reactive to light.  Cardiovascular:     Rate and Rhythm: Normal rate and regular rhythm.  Pulmonary:     Effort: Pulmonary effort is normal.     Breath sounds: Normal breath sounds.  Abdominal:      General: Abdomen is flat. Bowel sounds are normal.     Palpations: Abdomen is soft.     Tenderness: There is no guarding or rebound.  Musculoskeletal:        General: Normal range of motion.     Cervical back: Normal range of motion.  Skin:    General: Skin is warm.     Capillary Refill: Capillary refill takes less than 2 seconds.  Neurological:     General: No focal deficit present.     Mental Status: She is alert.  Psychiatric:        Mood and Affect: Mood normal.        Behavior: Behavior normal.        Thought Content: Thought content normal.        Judgment: Judgment normal.  Results Labs A1c (01/06/2024): 6.1  Diagnostic EKG (09/22/2023): Ventricular rate 66, PR interval 162, QT 422/442, normal sinus rhythm, nonspecific T wave inversion V1-V3, otherwise normal  Cardiac cta 10/19/23 calcium  score 5 no significant blockages  Echo 08/27/23 EF 60-65% trivial TR   EKG today 01/29/24 bradycardia rate 57 bpm QT 428 PR 160 t wave inversion V1 nonspecific  Overall stable from prior EKG as well as normal rhythm      [1]  Current Outpatient Medications:    ACCU-CHEK GUIDE test strip, USE TO CHECK BLOOD SUGAR UP TO 4 TIMES DAILY AS DIRECTED, Disp: , Rfl:    Accu-Chek Softclix Lancets lancets, SMARTSIG:Topical 1-4 Times Daily, Disp: , Rfl:    acetaminophen  (TYLENOL ) 500 MG tablet, Take 500-1,000 mg by mouth every 6 (six) hours as needed (pain.)., Disp: , Rfl:    amLODipine  (NORVASC ) 10 MG tablet, Take 1 tablet by mouth once daily, Disp: 90 tablet, Rfl: 3   atorvastatin  (LIPITOR) 10 MG tablet, Take 1 tablet by mouth once daily, Disp: 90 tablet, Rfl: 0   blood glucose meter kit and supplies, Dispense based on patient and insurance preference. Use up to four times daily as directed. (FOR ICD-10 E10.9, E11.9)., Disp: 1 each, Rfl: 0   buPROPion  (WELLBUTRIN ) 75 MG tablet, Take 1 tablet (75 mg total) by mouth every morning., Disp: 90 tablet, Rfl: 1   clobetasol  cream  (TEMOVATE ) 0.05 %, Apply 1 Application topically 2 (two) times daily., Disp: 30 g, Rfl: 11   clotrimazole -betamethasone  (LOTRISONE ) cream, Apply externally BID prn sx up to 2 wks, Disp: 15 g, Rfl: 1   losartan  (COZAAR ) 50 MG tablet, Take 1 tablet (50 mg total) by mouth daily., Disp: , Rfl:    metFORMIN  (GLUCOPHAGE ) 500 MG tablet, TAKE 1 TABLET BY MOUTH TWICE DAILY WITH A MEAL, Disp: 180 tablet, Rfl: 0   methocarbamol  (ROBAXIN ) 500 MG tablet, Take 1 tablet (500 mg total) by mouth 4 (four) times daily., Disp: 120 tablet, Rfl: 0   metoprolol  tartrate (LOPRESSOR ) 100 MG tablet, Take 1 tablet (100 mg total) by mouth once for 1 dose., Disp: 1 tablet, Rfl: 0   pantoprazole  (PROTONIX ) 20 MG tablet, Take 1 tablet by mouth once daily, Disp: 90 tablet, Rfl: 0   rOPINIRole  (REQUIP ) 0.25 MG tablet, TAKE 1 TABLET BY MOUTH AT BEDTIME, Disp: 90 tablet, Rfl: 0   Semaglutide  (RYBELSUS ) 3 MG TABS, Take 1 tablet (3 mg total) by mouth daily., Disp: 30 tablet, Rfl: 0   sertraline  (ZOLOFT ) 100 MG tablet, Take 2 tablets by mouth once daily, Disp: 180 tablet, Rfl: 3   tacrolimus  (PROTOPIC ) 0.1 % ointment, APPLY 2 grams TO AFFECTED AREAS OF RASH TWICE DAILY UNTIL IMPROVED, Disp: 60 g, Rfl: 2   traZODone  (DESYREL ) 50 MG tablet, Take 0.5-1 tablets (25-50 mg total) by mouth at bedtime as needed for sleep., Disp: 90 tablet, Rfl: 3  "

## 2024-01-30 LAB — URINE CULTURE
MICRO NUMBER:: 17401094
Result:: NO GROWTH
SPECIMEN QUALITY:: ADEQUATE

## 2024-01-31 ENCOUNTER — Ambulatory Visit: Payer: Self-pay | Admitting: Family

## 2024-01-31 DIAGNOSIS — I2510 Atherosclerotic heart disease of native coronary artery without angina pectoris: Secondary | ICD-10-CM

## 2024-01-31 DIAGNOSIS — R809 Proteinuria, unspecified: Secondary | ICD-10-CM

## 2024-01-31 DIAGNOSIS — E119 Type 2 diabetes mellitus without complications: Secondary | ICD-10-CM

## 2024-01-31 DIAGNOSIS — I10 Essential (primary) hypertension: Secondary | ICD-10-CM

## 2024-02-05 NOTE — Progress Notes (Signed)
 "   Virtual Visit via Telephone Note   Because of Nicole Cervantes co-morbid illnesses, she is at least at moderate risk for complications without adequate follow up.  This format is felt to be most appropriate for this patient at this time.  Due to technical limitations with video connection web designer), today's appointment will be conducted as an audio only telehealth visit, and Nicole Cervantes verbally agreed to proceed in this manner.   All issues noted in this document were discussed and addressed.  No physical exam could be performed with this format.  Evaluation Performed:  Preoperative cardiovascular risk assessment _____________   Date:  02/09/2024   Patient ID:  Nicole Cervantes, DOB 11-16-1975, MRN [id number] Patient Location:  Home Provider location:   Office  Primary Care Provider:  Corwin Antu, FNP Primary Cardiologist:  None  Chief Complaint / Patient Profile  49 y.o. y/o female with a h/o hypertension, bipolar/anxiety/diabetes, prior tobacco use, stopped in 2009 who is pending left side carpal tunnel release with ultrasound guidance on 02/18/2024 with Dr. Claudene and presents today for telephonic preoperative cardiovascular risk assessment. History of Present Illness  Nicole Cervantes is a 49 y.o. female who presents via audio/video conferencing for a telehealth visit today.  Pt was last seen in cardiology clinic on 09/22/2023 by Dr. Gollan.  At that time Nicole Cervantes was being evaluated for atypical chest pain and shortness of breath, prior echocardiogram in 08/2023 showed normal LV size and function, normal RV size and function, no significant valvular heart disease, coronary CTA was ordered for further evaluation.  Coronary CTA on 10/19/2023 indicated coronary calcium  score 05, minimal stenosis of the proximal LCx, small superficial myocardial bridge in the mid LAD, indicated chest pain likely musculoskeletal etiology or noncardiac source.  The patient is now pending procedure as outlined  above. Since her last visit, she has remained stable in the cardiac standpoint. Today she denies chest pain, shortness of breath, lower extremity edema, fatigue, palpitations, melena, hematuria, hemoptysis, diaphoresis, weakness, presyncope, syncope, orthopnea, and PND. She is able to achieve greater than 4 METs of activity.  Past Medical History    Past Medical History:  Diagnosis Date   Allergy    Anxiety    Back pain    Bipolar 1 disorder (HCC)    Carpal tunnel syndrome    bilateral   Cervical spondylosis with myelopathy and radiculopathy    Depression    Essential hypertension    DR. GRANDIS - [facility]   Iron deficiency anemia    Major depressive disorder    Migraines    Mixed hyperlipidemia    Obstructive sleep apnea on CPAP    Polycystic ovarian syndrome    Type 2 diabetes mellitus with hyperglycemia, without long-term current use of insulin (HCC)    Vestibular migraine    Past Surgical History:  Procedure Laterality Date   ABDOMINAL HYSTERECTOMY  05/2007   IN [place], [place] - TOTAL EXCEPT OVARIES, LEIOMYOMA   ANTERIOR CERVICAL DECOMP/DISCECTOMY FUSION N/A 03/11/2023   Procedure: C4-6 ANTERIOR CERVICAL DISCECTOMY AND FUSION;  Surgeon: Clois Fret, MD;  Location: [facility] ORS;  Service: Neurosurgery;  Laterality: N/A;   COLONOSCOPY  2006   DX   COLONOSCOPY  2024   LAPAROSCOPY  2000   BURNED HOLES IN OVARIES TO RELEASE PRESSURE FROM CYSTS.  DR. BURNS, [place]   OVARIAN CYST REMOVAL     PILONIDAL CYST EXCISION     TONSILLECTOMY AND ADENOIDECTOMY  1978  Allergies  Allergies[1]  Home Medications    Prior to Admission medications  Medication Sig Start Date End Date Taking? Authorizing Provider  ACCU-CHEK GUIDE test strip USE TO CHECK BLOOD SUGAR UP TO 4 TIMES DAILY AS DIRECTED 11/11/21   [provider]  Accu-Chek Softclix Lancets lancets SMARTSIG:Topical 1-4 Times Daily 11/11/21   [provider]  acetaminophen  (TYLENOL ) 500 MG tablet Take  500-1,000 mg by mouth every 6 (six) hours as needed (pain.).    [provider]  amLODipine  (NORVASC ) 10 MG tablet Take 1 tablet by mouth once daily 07/16/23   Dugal, Tabitha, FNP  atorvastatin  (LIPITOR) 10 MG tablet Take 1 tablet by mouth once daily 11/09/23   Dugal, Tabitha, FNP  blood glucose meter kit and supplies Dispense based on patient and insurance preference. Use up to four times daily as directed. (FOR ICD-10 E10.9, E11.9). 11/11/21   Dugal, Tabitha, FNP  buPROPion  (WELLBUTRIN ) 75 MG tablet Take 1 tablet (75 mg total) by mouth every morning. 12/11/23   Eappen, Saramma, MD  clobetasol  cream (TEMOVATE ) 0.05 % Apply 1 Application topically 2 (two) times daily. 05/23/22   Corwin Antu, FNP  clotrimazole -betamethasone  (LOTRISONE ) cream Apply externally BID prn sx up to 2 wks 01/04/21   Lynda Bradley, CNM  losartan  (COZAAR ) 50 MG tablet Take 1 tablet (50 mg total) by mouth daily. 07/16/23   Dugal, Tabitha, FNP  metFORMIN  (GLUCOPHAGE ) 500 MG tablet TAKE 1 TABLET BY MOUTH TWICE DAILY WITH A MEAL 01/19/24   Corwin Antu, FNP  methocarbamol  (ROBAXIN ) 500 MG tablet Take 1 tablet (500 mg total) by mouth 4 (four) times daily. 05/28/23   Gregory Edsel Ruth, PA  metoprolol  tartrate (LOPRESSOR ) 100 MG tablet Take 1 tablet (100 mg total) by mouth once for 1 dose. 09/22/23 01/29/24  Gollan, Timothy J, MD  pantoprazole  (PROTONIX ) 20 MG tablet Take 1 tablet by mouth once daily 11/30/23   Dugal, Tabitha, FNP  rOPINIRole  (REQUIP ) 0.25 MG tablet TAKE 1 TABLET BY MOUTH AT BEDTIME 12/25/23   Dugal, Tabitha, FNP  Semaglutide  (RYBELSUS ) 3 MG TABS Take 1 tablet (3 mg total) by mouth daily. 01/29/24   Dugal, Tabitha, FNP  sertraline  (ZOLOFT ) 100 MG tablet Take 2 tablets by mouth once daily 07/16/23   Dugal, Tabitha, FNP  tacrolimus  (PROTOPIC ) 0.1 % ointment APPLY 2 grams TO AFFECTED AREAS OF RASH TWICE DAILY UNTIL IMPROVED 08/04/23   Jackquline Sawyer, MD  traZODone  (DESYREL ) 50 MG tablet Take 0.5-1 tablets (25-50 mg  total) by mouth at bedtime as needed for sleep. 12/11/23   Eappen, Saramma, MD    Physical Exam    Vital Signs:  Nicole Cervantes does not have vital signs available for review today.  Given telephonic nature of communication, physical exam is limited. AAOx3. NAD. Normal affect.  Speech and respirations are unlabored. Accessory Clinical Findings   None Assessment & Plan    1.  Preoperative Cardiovascular Risk Assessment: Nicole Cervantes perioperative risk of a major cardiac event is 0.4% according to the Revised Cardiac Risk Index (RCRI).  Therefore, she is at low risk for perioperative complications.   Her functional capacity is good at 7.25 METs according to the [facility] Activity Status Index (DASI). Recommendations: According to ACC/AHA guidelines, no further cardiovascular testing needed.  The patient may proceed to surgery at acceptable risk.   Antiplatelet and/or Anticoagulation Recommendations: None requested   The patient was advised that if she develops new symptoms prior to surgery to contact our office to arrange for a follow-up  visit, and she verbalized understanding.  A copy of this note will be routed to requesting surgeon.  Time:   Today, I have spent 10 minutes with the patient with telehealth technology discussing medical history, symptoms, and management plan.    Talton Delpriore D Mckenna Gamm, NP  02/09/2024, 9:52 AM      [1] No Known Allergies  "

## 2024-02-09 ENCOUNTER — Ambulatory Visit: Attending: Cardiology

## 2024-02-09 ENCOUNTER — Other Ambulatory Visit: Payer: Self-pay | Admitting: Family

## 2024-02-09 DIAGNOSIS — Z01810 Encounter for preprocedural cardiovascular examination: Secondary | ICD-10-CM | POA: Diagnosis not present

## 2024-02-09 DIAGNOSIS — E782 Mixed hyperlipidemia: Secondary | ICD-10-CM

## 2024-02-11 ENCOUNTER — Other Ambulatory Visit: Payer: Self-pay

## 2024-02-11 ENCOUNTER — Encounter
Admission: RE | Admit: 2024-02-11 | Discharge: 2024-02-11 | Disposition: A | Discharge status: Home / Self Care | Source: Ambulatory Visit | Attending: Neurosurgery | Admitting: Neurosurgery

## 2024-02-11 VITALS — Ht 64.0 in | Wt 225.0 lb

## 2024-02-11 DIAGNOSIS — E119 Type 2 diabetes mellitus without complications: Secondary | ICD-10-CM

## 2024-02-11 NOTE — Patient Instructions (Addendum)
 Your procedure is scheduled on: Thursday 02/18/24  Report to the Registration Desk on the 1st floor of the [facility]. To find out your arrival time, please call [phone] between 1PM - 3PM on: Wednesday 02/17/24 If your arrival time is 6:00 am, do not arrive before that time as the [facility] entrance doors do not open until 6:00 am.  REMEMBER: Instructions that are not followed completely may result in serious medical risk, up to and including death; or upon the discretion of your surgeon and anesthesiologist your surgery may need to be rescheduled.  Do not eat food after midnight the night before surgery.  No gum chewing or hard candies.  You may however, drink CLEAR liquids up to 2 hours before you are scheduled to arrive for your surgery. Do not drink anything within 2 hours of your scheduled arrival time.  Clear liquids include: - water  Do NOT drink anything that is not on this list.  One week prior to surgery: Stop Anti-inflammatories (NSAIDS) such as Advil, Aleve , Ibuprofen, Motrin, Naproxen , Naprosyn  and Aspirin based products such as Excedrin, Goody's Powder, BC Powder. Stop ANY OVER THE COUNTER supplements until after surgery.  You may however, continue to take Tylenol  if needed for pain up until the day of surgery.  Stop metFORMIN  (GLUCOPHAGE ) 500 MG 2 days prior to surgery (Take last dose Monday 02/15/24) Stop Semaglutide  (RYBELSUS ) 3 MG 1 days prior to surgery (take last dose Tuesday 02/16/24)  Continue taking all of your other prescription medications up until the day of surgery.  ON THE DAY OF SURGERY ONLY TAKE THESE MEDICATIONS WITH SIPS OF WATER:  amLODipine  (NORVASC ) 10 MG  buPROPion  (WELLBUTRIN ) 75 MG  metoprolol  tartrate (LOPRESSOR ) 100 MG  pantoprazole  (PROTONIX ) 20 MG  sertraline  (ZOLOFT ) 100 MG   No Alcohol for 24 hours before or after surgery.  No Smoking including e-cigarettes for 24 hours before surgery.  No chewable tobacco products for at  least 6 hours before surgery.  No nicotine patches on the day of surgery.  Do not use any recreational drugs for at least a week (preferably 2 weeks) before your surgery.  Please be advised that the combination of cocaine  and anesthesia may have negative outcomes, up to and including death. If you test positive for cocaine , your surgery will be cancelled.  On the morning of surgery brush your teeth with toothpaste and water, you may rinse your mouth with mouthwash if you wish. Do not swallow any toothpaste or mouthwash.  Use CHG Soap or wipes as directed on instruction sheet.  Do not wear jewelry, make-up, hairpins, clips or nail polish.  For welded (permanent) jewelry: bracelets, anklets, waist bands, etc.  Please have this removed prior to surgery.  If it is not removed, there is a chance that hospital personnel will need to cut it off on the day of surgery.  Do not wear lotions, powders, or perfumes.   Do not shave body hair from the neck down 48 hours before surgery.  Contact lenses, hearing aids and dentures may not be worn into surgery.  Do not bring valuables to the hospital. [facility] is not responsible for any missing/lost belongings or valuables.   Notify your doctor if there is any change in your medical condition (cold, fever, infection).  Wear comfortable clothing (specific to your surgery type) to the hospital.  After surgery, you can help prevent lung complications by doing breathing exercises.  Take deep breaths and cough every 1-2 hours. Your doctor  may order a device called an Incentive Spirometer to help you take deep breaths. When coughing or sneezing, hold a pillow firmly against your incision with both hands. This is called splinting. Doing this helps protect your incision. It also decreases belly discomfort.  If you are being admitted to the hospital overnight, leave your suitcase in the car. After surgery it may be brought to your room.  In case of  increased patient census, it may be necessary for you, the patient, to continue your postoperative care in the Same Day Surgery department.  If you are being discharged the day of surgery, you will not be allowed to drive home. You will need a responsible individual to drive you home and stay with you for 24 hours after surgery.   If you are taking public transportation, you will need to have a responsible individual with you.  Please call the Pre-admissions Testing Dept. at [phone] if you have any questions about these instructions.  Surgery Visitation Policy:  Patients having surgery or a procedure may have two visitors.  Children under the age of 65 must have an adult with them who is not the patient.  Inpatient Visitation:    Visiting hours are 7 a.m. to 8 p.m. Up to four visitors are allowed at one time in a patient room. The visitors may rotate out with other people during the day.  One visitor age 62 or older may stay with the patient overnight and must be in the room by 8 p.m.   Merchandiser, Retail to address health-related social needs:  [email or web address]                                                                                                            Preparing for Surgery with CHLORHEXIDINE  GLUCONATE (CHG) Soap  Chlorhexidine  Gluconate (CHG) Soap  o An antiseptic cleaner that kills germs and bonds with the skin to continue killing germs even after washing  o Used for showering the night before surgery and morning of surgery  Before surgery, you can play an important role by reducing the number of germs on your skin.  CHG (Chlorhexidine  gluconate) soap is an antiseptic cleanser which kills germs and bonds with the skin to continue killing germs even after washing.  Please do not use if you have an allergy to CHG or antibacterial soaps. If your skin becomes reddened/irritated stop using the CHG.  1. Shower the NIGHT BEFORE SURGERY  with CHG soap.  2. If you choose to wash your hair, wash your hair first as usual with your normal shampoo.  3. After shampooing, rinse your hair and body thoroughly to remove the shampoo.  4. Use CHG as you would any other liquid soap. You can apply CHG directly to the skin and wash gently with a clean washcloth.  5. Apply the CHG soap to your body only from the neck down. Do not use on open wounds or open sores. Avoid contact with your eyes, ears, mouth, and genitals (private parts). Wash face and genitals (  private parts) with your normal soap.  6. Wash thoroughly, paying special attention to the area where your surgery will be performed.  7. Thoroughly rinse your body with warm water.  8. Do not shower/wash with your normal soap after using and rinsing off the CHG soap.  9. Do not use lotions, oils, etc., after showering with CHG.  10. Pat yourself dry with a clean towel.  11. Wear clean pajamas to bed the night before surgery.  12. Place clean sheets on your bed the night of your shower and do not sleep with pets.  13. Do not apply any deodorants/lotions/powders.  14. Please wear clean clothes to the hospital.  15. Remember to brush your teeth with your regular toothpaste.

## 2024-02-11 NOTE — Progress Notes (Signed)
 Cardiac Clearance in Epic (02-09-24) from Kaitlin West NP. Low Risk

## 2024-02-18 ENCOUNTER — Other Ambulatory Visit: Payer: Self-pay

## 2024-02-18 ENCOUNTER — Encounter: Payer: Self-pay | Admitting: Neurosurgery

## 2024-02-18 ENCOUNTER — Encounter
Admission: RE | Disposition: A | Discharge status: Home / Self Care | Payer: Self-pay | Source: Home / Self Care | Attending: Neurosurgery

## 2024-02-18 ENCOUNTER — Ambulatory Visit
Admission: RE | Admit: 2024-02-18 | Discharge: 2024-02-18 | Disposition: A | Discharge status: Home / Self Care | Attending: Neurosurgery | Admitting: Neurosurgery

## 2024-02-18 ENCOUNTER — Ambulatory Visit: Admitting: Certified Registered"

## 2024-02-18 DIAGNOSIS — Z87891 Personal history of nicotine dependence: Secondary | ICD-10-CM | POA: Diagnosis not present

## 2024-02-18 DIAGNOSIS — Z981 Arthrodesis status: Secondary | ICD-10-CM | POA: Insufficient documentation

## 2024-02-18 DIAGNOSIS — Z6841 Body Mass Index (BMI) 40.0 and over, adult: Secondary | ICD-10-CM | POA: Diagnosis not present

## 2024-02-18 DIAGNOSIS — Z01818 Encounter for other preprocedural examination: Secondary | ICD-10-CM

## 2024-02-18 DIAGNOSIS — G709 Myoneural disorder, unspecified: Secondary | ICD-10-CM | POA: Diagnosis not present

## 2024-02-18 DIAGNOSIS — E1141 Type 2 diabetes mellitus with diabetic mononeuropathy: Secondary | ICD-10-CM | POA: Insufficient documentation

## 2024-02-18 DIAGNOSIS — E66813 Obesity, class 3: Secondary | ICD-10-CM | POA: Diagnosis not present

## 2024-02-18 DIAGNOSIS — G4730 Sleep apnea, unspecified: Secondary | ICD-10-CM | POA: Insufficient documentation

## 2024-02-18 DIAGNOSIS — I10 Essential (primary) hypertension: Secondary | ICD-10-CM | POA: Insufficient documentation

## 2024-02-18 DIAGNOSIS — Z7984 Long term (current) use of oral hypoglycemic drugs: Secondary | ICD-10-CM | POA: Diagnosis not present

## 2024-02-18 DIAGNOSIS — E119 Type 2 diabetes mellitus without complications: Secondary | ICD-10-CM

## 2024-02-18 DIAGNOSIS — F419 Anxiety disorder, unspecified: Secondary | ICD-10-CM | POA: Insufficient documentation

## 2024-02-18 DIAGNOSIS — G5603 Carpal tunnel syndrome, bilateral upper limbs: Secondary | ICD-10-CM | POA: Insufficient documentation

## 2024-02-18 DIAGNOSIS — I2510 Atherosclerotic heart disease of native coronary artery without angina pectoris: Secondary | ICD-10-CM | POA: Insufficient documentation

## 2024-02-18 DIAGNOSIS — F319 Bipolar disorder, unspecified: Secondary | ICD-10-CM | POA: Diagnosis not present

## 2024-02-18 DIAGNOSIS — G5602 Carpal tunnel syndrome, left upper limb: Secondary | ICD-10-CM | POA: Diagnosis not present

## 2024-02-18 DIAGNOSIS — G5600 Carpal tunnel syndrome, unspecified upper limb: Secondary | ICD-10-CM | POA: Diagnosis present

## 2024-02-18 HISTORY — PX: CARPAL TUNNEL RELEASE: SHX101

## 2024-02-18 LAB — GLUCOSE, CAPILLARY
Glucose-Capillary: 106 mg/dL — ABNORMAL HIGH (ref 70–99)
Glucose-Capillary: 116 mg/dL — ABNORMAL HIGH (ref 70–99)

## 2024-02-18 MED ORDER — LIDOCAINE HCL (PF) 1 % IJ SOLN
INTRAMUSCULAR | Status: AC
  Filled 2024-02-18: qty 30

## 2024-02-18 MED ORDER — BUPIVACAINE HCL (PF) 0.5 % IJ SOLN
INTRAMUSCULAR | Status: AC
  Filled 2024-02-18: qty 30

## 2024-02-18 MED ORDER — 0.9 % SODIUM CHLORIDE (POUR BTL) OPTIME
TOPICAL | Status: DC | PRN
  Administered 2024-02-18: 1000 mL

## 2024-02-18 MED ORDER — CHLORHEXIDINE GLUCONATE 0.12 % MT SOLN
15.0000 mL | Freq: Once | OROMUCOSAL | Status: AC
  Administered 2024-02-18: 15 mL via OROMUCOSAL

## 2024-02-18 MED ORDER — SENNA 8.6 MG PO TABS
1.0000 | ORAL_TABLET | Freq: Every day | ORAL | 0 refills | Status: AC
  Filled 2024-02-18: qty 7, 7d supply, fill #0

## 2024-02-18 MED ORDER — OXYCODONE HCL 5 MG PO TABS: 5.0000 mg | ORAL_TABLET | Freq: Once | ORAL | Status: DC | PRN

## 2024-02-18 MED ORDER — MIDAZOLAM HCL 2 MG/2ML IJ SOLN
INTRAMUSCULAR | Status: AC
  Filled 2024-02-18: qty 2

## 2024-02-18 MED ORDER — MIDAZOLAM HCL (PF) 2 MG/2ML IJ SOLN
INTRAMUSCULAR | Status: DC | PRN
  Administered 2024-02-18: 2 mg via INTRAVENOUS

## 2024-02-18 MED ORDER — TRAMADOL HCL 50 MG PO TABS
50.0000 mg | ORAL_TABLET | Freq: Four times a day (QID) | ORAL | 0 refills | Status: AC | PRN
  Filled 2024-02-18: qty 28, 7d supply, fill #0

## 2024-02-18 MED ORDER — CEFAZOLIN SODIUM-DEXTROSE 2-4 GM/100ML-% IV SOLN
2.0000 g | Freq: Once | INTRAVENOUS | Status: AC
  Administered 2024-02-18: 2 g via INTRAVENOUS

## 2024-02-18 MED ORDER — SODIUM CHLORIDE 0.9 % IV SOLN: INTRAVENOUS | Status: DC

## 2024-02-18 MED ORDER — CHLORHEXIDINE GLUCONATE 0.12 % MT SOLN
OROMUCOSAL | Status: AC
  Filled 2024-02-18: qty 15

## 2024-02-18 MED ORDER — DOCUSATE SODIUM 100 MG PO CAPS
100.0000 mg | ORAL_CAPSULE | Freq: Two times a day (BID) | ORAL | 0 refills | Status: AC
  Filled 2024-02-18: qty 14, 7d supply, fill #0

## 2024-02-18 MED ORDER — ORAL CARE MOUTH RINSE: 15.0000 mL | Freq: Once | OROMUCOSAL | Status: AC

## 2024-02-18 MED ORDER — FENTANYL CITRATE (PF) 100 MCG/2ML IJ SOLN: 25.0000 ug | INTRAMUSCULAR | Status: DC | PRN

## 2024-02-18 MED ORDER — OXYCODONE HCL 5 MG/5ML PO SOLN: 5.0000 mg | Freq: Once | ORAL | Status: DC | PRN

## 2024-02-18 MED ORDER — CEFAZOLIN SODIUM-DEXTROSE 2-4 GM/100ML-% IV SOLN
INTRAVENOUS | Status: AC
  Filled 2024-02-18: qty 100

## 2024-02-18 MED ORDER — LIDOCAINE HCL 1 % IJ SOLN
INTRAMUSCULAR | Status: DC | PRN
  Administered 2024-02-18: 5 mL

## 2024-02-18 MED ORDER — PROPOFOL 500 MG/50ML IV EMUL
INTRAVENOUS | Status: DC | PRN
  Administered 2024-02-18: 150 ug/kg/min via INTRAVENOUS

## 2024-02-18 MED ORDER — DEXMEDETOMIDINE HCL IN NACL 80 MCG/20ML IV SOLN
INTRAVENOUS | Status: DC | PRN
  Administered 2024-02-18: 8 ug via INTRAVENOUS
  Administered 2024-02-18: 4 ug via INTRAVENOUS

## 2024-02-18 NOTE — Transfer of Care (Signed)
 Immediate Anesthesia Transfer of Care Note  Patient: Nicole Cervantes  Procedure(s) Performed: Left Side Carpal Tunnel Release with Ultrasound Guidance (Left)  Patient Location: PACU  Anesthesia Type:General  Level of Consciousness: drowsy and patient cooperative  Airway & Oxygen Therapy: Patient Spontanous Breathing and Patient connected to face mask oxygen  Post-op Assessment: Report given to RN, Post -op Vital signs reviewed and stable, and Patient moving all extremities X 4  Post vital signs: Reviewed and stable  Last Vitals:  Vitals Value Taken Time  BP    Temp    Pulse    Resp    SpO2      Last Pain:  Vitals:   02/18/24 0750  TempSrc: Temporal  PainSc: 0-No pain         Complications: No notable events documented.

## 2024-02-18 NOTE — Op Note (Signed)
 Indications: Patient with a history of median neuropathy at the wrist with hand weakness refractory to conservative management.  Findings: Severe compression of the median nerve at the transverse carpal ligament  Preoperative Diagnosis:Left Side Carpal Tunnel Syndrome  Postoperative Diagnosis: Left Side Carpal Tunnel Syndrome   Postoperative Diagnosis: same   EBL: Minimal IVF: See anesthesia report Drains: none Disposition:Stable to PACU Complications: none  No foley catheter was placed.   Preoperative Note: patient with a history of progressive left median neuropathy with hand weakness refractory to conservative management.  They had tried rest, padding, and watchful waiting but had continued progressive symptoms.  Given the progression of her median neuropathy plan was made for median nerve decompression  Risk of surgery is discussed and include: Infection, bleeding, wound healing issues, pillar pain nerve injury, pain, failure to relieve the symptoms, need for further surgery.  Procedure:  1) left sided carpal tunnel decompression with ultrasound guidance   Procedure: After obtaining informed consent, the patient taken to the operating room, placed in supine position, monitored anesthesia care was induced.  They were given preoperative antibiotics.  Prepped and draped in the usual fashion.  Comprehensive timeout was performed verifying the patient's name, MRN, planned procedure.  An ultrasound was used with a sterile probe.  We used this to mark out our safety points including the interval between the ulnar artery and median nerve.  We identified the motor branch as well as the first sensory branch which were both in safe position.  We also identified the vascular arcade which was in safe positioning as well.  At this point we placed a block with Marcaine without epinephrine.  We blocked the skin where the incision would be, the superficial sensory median nerve, as well as the  transverse carpal ligament.  Under ultrasound guidance we utilized the local anesthetic to perform a hydrodissection of the nerve from the transverse carpal ligament.  We then prepped the sonexs ultra CTR knife on the back table while the anesthetic set in.  We performed a small linear incision approximately 2 to 3 mm.  We then utilized a Statistician under ultrasound guidance to identify the underside of the transverse carpal ligament.  The fat and connective tissue was dissected off the underside.  We could feel that this was quite thickened and calcified.  Causing severe compression.  Once we had a clear tract we then placed the ultrasound-guided knife into the incision and advanced it through the carpal tunnel.  We verified the safety zones including the median nerve which did not significantly cross over the knife.  We are able to see the first sensory branch which was not crossing the knife.  The artery was also in a safe place.  At this point we divided the transverse carpal ligament under ultrasound guidance, to get a full release it took 2 passes.  The nerve relaxed laterally and was well decompressed.  After the 2 passes we placed a [place] 4 into the wound and were able to feel a complete dissection of the transverse carpal ligament.  We then irrigated, we got meticulous hemostasis.  Skin glue was placed on the incision and a Band-Aid was placed on top once this was dried.  No immediate complications.  Sponge and pattie counts were correct at the end of the procedure.   I performed the procedure without an assistant surgeon  Penne MICAEL Sharps, MD/MSCR

## 2024-02-18 NOTE — Discharge Instructions (Addendum)

## 2024-02-18 NOTE — Anesthesia Postprocedure Evaluation (Signed)
"   Anesthesia Post Note  Patient: Nicole Cervantes  Procedure(s) Performed: Left Side Carpal Tunnel Release with Ultrasound Guidance (Left)  Patient location during evaluation: PACU Anesthesia Type: General Level of consciousness: awake and alert Pain management: pain level controlled Vital Signs Assessment: post-procedure vital signs reviewed and stable Respiratory status: spontaneous breathing, nonlabored ventilation, respiratory function stable and patient connected to nasal cannula oxygen Cardiovascular status: blood pressure returned to baseline and stable Postop Assessment: no apparent nausea or vomiting Anesthetic complications: no   No notable events documented.   Last Vitals:  Vitals:   02/18/24 0915 02/18/24 0930  BP: 135/75 133/80  Pulse: (!) 54 (!) 53  Resp: 10 16  Temp:  (!) 36.1 C  SpO2: 98% 96%    Last Pain:  Vitals:   02/18/24 0930  TempSrc:   PainSc: 0-No pain                 Lendia LITTIE Mae      "

## 2024-02-18 NOTE — Interval H&P Note (Signed)
 History and Physical Interval Note:  02/18/2024 8:00 AM  Nicole Cervantes  has presented today for surgery, with the diagnosis of Left Side Carpal Tunnel Syndrome.  The various methods of treatment have been discussed with the patient and family. After consideration of risks, benefits and other options for treatment, the patient has consented to  Procedures: Left Side Carpal Tunnel Release with Ultrasound Guidance (Left) as a surgical intervention.  The patient's history has been reviewed, patient examined, no change in status, stable for surgery.  I have reviewed the patient's chart and labs.  Questions were answered to the patient's satisfaction.    Heart and lungs clear   Penne LELON Sharps

## 2024-02-18 NOTE — H&P (Signed)
 Referring Physician:  Corwin Antu, FNP [address] [place],  [place] [address]  Primary Physician:  Corwin Antu, FNP  History of Present Illness: 01/11/2024 [name] is here today with a chief complaint of left-sided carpal tunnel syndrome.  She has a longstanding history of bilateral carpal tunnel syndrome and was previously scheduled to have a left-sided carpal tunnel decompression.  Unfortunately had some insurance issues and had to have this canceled.  She previously went under a cervical decompression and fusion.  Is here today to discuss her left-sided carpal tunnel syndrome predominantly.  She has nighttime symptoms, gets daily symptoms when utilizing her hand to hold her phone or read a book.  She often has to ring out her hand.  This wakes her up multiple times a week and impacts her daily activities on a daily basis.  General Review of Systems:  A ROS was performed including pertinent positive and negatives as documented.  All other systems are negative.   Prior to Admission medications   Medication Sig Start Date End Date Taking? Authorizing Provider  ACCU-CHEK GUIDE test strip USE TO CHECK BLOOD SUGAR UP TO 4 TIMES DAILY AS DIRECTED 11/11/21   [provider]  Accu-Chek Softclix Lancets lancets SMARTSIG:Topical 1-4 Times Daily 11/11/21   [provider]  acetaminophen  (TYLENOL ) 500 MG tablet Take 500-1,000 mg by mouth every 6 (six) hours as needed (pain.).    [provider]  amLODipine  (NORVASC ) 10 MG tablet Take 1 tablet by mouth once daily 12/19/22   Dugal, Tabitha, FNP  atorvastatin  (LIPITOR) 10 MG tablet Take 1 tablet by mouth once daily 05/11/23   Dugal, Tabitha, FNP  blood glucose meter kit and supplies Dispense based on patient and insurance preference. Use up to four times daily as directed. (FOR ICD-10 E10.9, E11.9). 11/11/21   Dugal, Tabitha, FNP  buPROPion  (WELLBUTRIN ) 75 MG tablet TAKE 1 TABLET BY MOUTH IN THE MORNING  04/03/23   Eappen, Saramma, MD  clobetasol  cream (TEMOVATE ) 0.05 % Apply 1 Application topically 2 (two) times daily. Patient taking differently: Apply 1 Application topically 2 (two) times daily as needed (skin irritation.). 05/23/22   Corwin Antu, FNP  clotrimazole -betamethasone  (LOTRISONE ) cream Apply externally BID prn sx up to 2 wks Patient taking differently: Apply 1 Application topically 2 (two) times daily as needed (skin irritation.). 01/04/21   Lynda Bradley, CNM  cyanocobalamin  (VITAMIN B12) 1000 MCG tablet Take 1,000 mcg by mouth in the morning.    [provider]  gabapentin (NEURONTIN) 100 MG capsule Take 100 mg by mouth at bedtime as needed (pain.). 12/11/22   [provider]  losartan  (COZAAR ) 50 MG tablet Take 1 tablet (50 mg total) by mouth daily. MUST HAVE OV FOR FURTHER REFILLS 04/27/23   Corwin Antu, FNP  metFORMIN  (GLUCOPHAGE ) 500 MG tablet Take 1 tablet (500 mg total) by mouth 2 (two) times daily with a meal. MUST HAVE OV FOR FURTHER REFILLS Patient taking differently: Take 500 mg by mouth 2 (two) times daily with a meal. Takes as needed 04/27/23   Corwin Antu, FNP  methocarbamol  (ROBAXIN ) 500 MG tablet Take 1 tablet (500 mg total) by mouth 4 (four) times daily. 05/28/23   Gregory Edsel Ruth, PA  omeprazole  (PRILOSEC) 20 MG capsule Take 20 mg by mouth every other day. In the morning.    [provider]  rOPINIRole  (REQUIP ) 0.25 MG tablet Take 1 tablet (0.25 mg total) by mouth at bedtime. 10/16/22  Dugal, Tabitha, FNP  sertraline  (ZOLOFT ) 100 MG tablet Take 2 tablets by mouth once daily 12/19/22   Corwin Antu, FNP    DATA REVIEWED    Imaging Studies  05/28/23 c spine xrays  FINDINGS: Stable cervical spine alignment straightening of normal lordosis. Anterior fusion C4 through C6 with interbody spacers. The hardware appears intact. C6-C7 disc space narrowing and spurring, unchanged. No evidence of fracture or focal bone abnormality. No  prevertebral soft tissue thickening.   IMPRESSION: 1. Anterior fusion C4 through C6 without hardware complication. 2. C6-C7 degenerative disc disease.     Electronically Signed   By: Andrea Gasman M.D.   On: 06/09/2023 17:16  IMPRESSION  Mr. [name] is a 49 year old woman with a history of bilateral carpal tunnel syndrome.  She has had progressive left-sided carpal tunnel syndrome and carpal tunnel symptoms.  She has numbness and tingling in her median nerve distribution at the wrist.  She physical examination she shows positive Tinel sign positive Phalen and reverse Phalen.  She also shows some very mild weakness in the left sided median nerve musculature compared to the right sided median nerve musculature.  She shows decreased sensation with splitting of the ring finger on the median side.  PLAN  Plan to go forward with a left-sided carpal tunnel decompression with ultrasound guidance.  We discussed risk and benefits of surgery.  Given her progressive numbness tingling and difficulty utilizing her left upper extremity for activities of daily living we will plan to go forward with a decompression.  She has been wearing her braces religiously, and if she does not she gets severe symptoms that wake her within a short period of time.  Even with the braces she wakes up multiple times a week.  She has had previous injections.  Will plan for a left-sided carpal tunnel decompression with ultrasound guidance.  We discussed the risks and benefits of surgery.  She would like to go forward with a decompression   Penne MICAEL Sharps, MD [facility] neurosurgery [place].

## 2024-02-18 NOTE — Anesthesia Preprocedure Evaluation (Addendum)
 "                                  Anesthesia Evaluation  Patient identified by MRN, date of birth, ID band Patient awake    Reviewed: Allergy & Precautions, NPO status , Patient's Chart, lab work & pertinent test results  History of Anesthesia Complications Negative for: history of anesthetic complications  Airway Mallampati: III  TM Distance: >3 FB Neck ROM: full    Dental no notable dental hx.    Pulmonary sleep apnea and Continuous Positive Airway Pressure Ventilation , former smoker   Pulmonary exam normal        Cardiovascular hypertension, + CAD  Normal cardiovascular exam     Neuro/Psych  Headaches PSYCHIATRIC DISORDERS Anxiety Depression Bipolar Disorder    Neuromuscular disease    GI/Hepatic negative GI ROS, Neg liver ROS,,,  Endo/Other  diabetes, Type 2, Oral Hypoglycemic Agents  Class 3 obesity  Renal/GU negative Renal ROS  negative genitourinary   Musculoskeletal   Abdominal   Peds  Hematology  (+) Blood dyscrasia, anemia   Anesthesia Other Findings Past Medical History: No date: Allergy No date: Anxiety No date: Back pain No date: Bipolar 1 disorder (HCC) No date: Carpal tunnel syndrome     Comment:  bilateral No date: Cervical spondylosis with myelopathy and radiculopathy No date: Depression No date: Essential hypertension     Comment:  DR. ROJELIO - [facility] No date: Iron deficiency anemia No date: Major depressive disorder No date: Migraines No date: Mixed hyperlipidemia No date: Obstructive sleep apnea on CPAP No date: Polycystic ovarian syndrome No date: Type 2 diabetes mellitus with hyperglycemia, without long- term current use of insulin (HCC) No date: Vestibular migraine  Past Surgical History: 05/2007: ABDOMINAL HYSTERECTOMY     Comment:  IN [place], [place] - TOTAL EXCEPT OVARIES, LEIOMYOMA 03/11/2023: ANTERIOR CERVICAL DECOMP/DISCECTOMY FUSION; N/A     Comment:  Procedure: C4-6 ANTERIOR CERVICAL DISCECTOMY  AND FUSION;              Surgeon: Clois Fret, MD;  Location: [facility] ORS;                Service: Neurosurgery;  Laterality: N/A; 2006: COLONOSCOPY     Comment:  DX 2024: COLONOSCOPY 03/2023: fusion on neck     Comment:  c4-c6 2000: LAPAROSCOPY     Comment:  BURNED HOLES IN OVARIES TO RELEASE PRESSURE FROM CYSTS.               DR. BURNS, [place] No date: OVARIAN CYST REMOVAL No date: PILONIDAL CYST EXCISION 1978: TONSILLECTOMY AND ADENOIDECTOMY     Reproductive/Obstetrics negative OB ROS                              Anesthesia Physical Anesthesia Plan  ASA: 3  Anesthesia Plan: General   Post-op Pain Management: Toradol  IV (intra-op)* and Ofirmev  IV (intra-op)*   Induction: Intravenous  PONV Risk Score and Plan: 3 and Propofol  infusion and TIVA  Airway Management Planned: Natural Airway and Nasal Cannula  Additional Equipment:   Intra-op Plan:   Post-operative Plan:   Informed Consent: I have reviewed the patients History and Physical, chart, labs and discussed the procedure including the risks, benefits and alternatives for the proposed anesthesia with the patient or authorized representative who has indicated his/her understanding and acceptance.  Dental Advisory Given  Plan Discussed with: Anesthesiologist, CRNA and Surgeon  Anesthesia Plan Comments: (Patient consented for risks of anesthesia including but not limited to:  - adverse reactions to medications - risk of airway placement if required - damage to eyes, teeth, lips or other oral mucosa - nerve damage due to positioning  - sore throat or hoarseness - Damage to heart, brain, nerves, lungs, other parts of body or loss of life  Patient voiced understanding and assent.)         Anesthesia Quick Evaluation  "

## 2024-02-19 ENCOUNTER — Encounter: Payer: Self-pay | Admitting: Neurosurgery

## 2024-02-27 ENCOUNTER — Other Ambulatory Visit: Payer: Self-pay | Admitting: Family

## 2024-02-27 DIAGNOSIS — R12 Heartburn: Secondary | ICD-10-CM

## 2024-02-27 DIAGNOSIS — R0789 Other chest pain: Secondary | ICD-10-CM

## 2024-03-02 ENCOUNTER — Encounter: Payer: Self-pay | Admitting: Physician Assistant

## 2024-03-02 ENCOUNTER — Ambulatory Visit: Admitting: Physician Assistant

## 2024-03-02 VITALS — BP 130/84 | Temp 98.1°F | Ht 60.0 in | Wt 228.0 lb

## 2024-03-02 DIAGNOSIS — Z09 Encounter for follow-up examination after completed treatment for conditions other than malignant neoplasm: Secondary | ICD-10-CM

## 2024-03-02 DIAGNOSIS — G5602 Carpal tunnel syndrome, left upper limb: Secondary | ICD-10-CM

## 2024-03-02 NOTE — Progress Notes (Signed)
" ° °  REFERRING PHYSICIAN:  Corwin Antu, Fnp [address],  [place] [address]  DOS: 02/18/24, L  US  guided CTR  HISTORY OF PRESENT ILLNESS: Nicole Cervantes is 2 weeks status post L US  CTR. Overall, she is doing okay.  She has not noticed much improvement yet at this time since surgery.  She does continue to have some numbness and tingling at night and some irritation while working which includes quite a bit of typing.  No new weakness.  PHYSICAL EXAMINATION:  NEUROLOGICAL:  General: In no acute distress.   Awake, alert, oriented to person, place, and time.  Pupils equal round and reactive to light.  Facial tone is symmetric.    Strength:  Overall strength is to baseline with mild weakness in median nerve innervated intrinsics.   Incision c/d/I  Imaging:  No interval imaging  Assessment / Plan: Nicole Cervantes is doing well after left ultrasound-guided carpal tunnel release on 02/18/2024. We discussed activity escalation and I have advised the patient to lift up to 10 pounds until 6 weeks after surgery, then increase up to 25 pounds until 12 weeks after surgery.  After 12 weeks post-op, the patient advised to increase activity as tolerated. she will return to clinic in approximately 1 month.  Counseled patient on nerve recovery.  Encouraged icing and elevation of her left wrist as well as wearing a brace as needed at night.    Advised to contact the office if any questions or concerns arise.   Lyle Decamp PA-C Dept of Neurosurgery   "

## 2024-03-03 ENCOUNTER — Ambulatory Visit: Admitting: Psychiatry

## 2024-03-03 ENCOUNTER — Other Ambulatory Visit: Payer: Self-pay

## 2024-03-03 ENCOUNTER — Encounter: Payer: Self-pay | Admitting: Psychiatry

## 2024-03-03 VITALS — BP 128/84 | HR 74 | Temp 97.1°F | Ht 60.0 in | Wt 229.8 lb

## 2024-03-03 DIAGNOSIS — F3342 Major depressive disorder, recurrent, in full remission: Secondary | ICD-10-CM

## 2024-03-03 DIAGNOSIS — G4701 Insomnia due to medical condition: Secondary | ICD-10-CM

## 2024-03-03 DIAGNOSIS — F4010 Social phobia, unspecified: Secondary | ICD-10-CM | POA: Diagnosis not present

## 2024-03-03 DIAGNOSIS — F4000 Agoraphobia, unspecified: Secondary | ICD-10-CM

## 2024-03-03 NOTE — Progress Notes (Signed)
 [facility] MD OP Progress Note  03/03/2024 11:57 AM Nicole Cervantes  MRN:  [id number]  Chief Complaint:  Chief Complaint  Patient presents with   Follow-up   Anxiety   Depression   Medication Refill   Discussed the use of AI scribe software for clinical note transcription with the patient, who gave verbal consent to proceed.  History of Present Illness Nicole Cervantes is a 49 year old Caucasian female, married, employed, lives in [place], has a history of MDD, agoraphobia, social anxiety, hypertension, diabetes mellitus was evaluated in office today for a follow-up appointment.  She describes feeling wonderful overall and notes significant improvement in her mood and anxiety symptoms. She reports that increased social support, including having 3 close friends with whom she can talk openly, has contributed to her progress. She finds these relationships immensely helpful compared to previous feelings of loneliness and isolation. She reports that continued participation in her book club provides additional benefit.  She reports that trauma-related symptoms from her past relationship with her ex-boyfriend no longer cause her distress, although these memories may arise in conversation. She explains that these memories do not cause stress as they once did and feels emotionally well at this time.  She denies any suicidality, homicidality or perceptual disturbances.  She reports that she uses her CPAP regularly, which improves her sleep quality. She takes trazodone  as needed for sleep, particularly when experiencing difficulty, and finds it effective. She reports that her current regimen of bupropion  and sertraline  continues to work well, and she feels good on these medications.  She denies side effects.  She is no longer engaged in psychotherapy with Ms. Darice Ronde and believes she does not need it.   Visit Diagnosis:    ICD-10-CM   1. Recurrent major depressive disorder, in full remission  F33.42      2. Agoraphobia  F40.00     3. Social anxiety disorder  F40.10     4. Insomnia due to medical condition  G47.01    sleep apnea on CPAP, mood symptoms      Past Psychiatric History: Reviewed past psychiatric history from progress note on 11/28/2021.  Past trials of medications like Paxil , Lamictal , Wellbutrin , Seroquel.  Past Medical History:  Past Medical History:  Diagnosis Date   Allergy    Anxiety    Back pain    Bipolar 1 disorder (HCC)    Carpal tunnel syndrome    bilateral   Cervical spondylosis with myelopathy and radiculopathy    Depression    Essential hypertension    DR. GRANDIS - [facility]   Iron deficiency anemia    Major depressive disorder    Migraines    Mixed hyperlipidemia    Obstructive sleep apnea on CPAP    Polycystic ovarian syndrome    Type 2 diabetes mellitus with hyperglycemia, without long-term current use of insulin (HCC)    Vestibular migraine     Past Surgical History:  Procedure Laterality Date   ABDOMINAL HYSTERECTOMY  05/2007   IN [place], [place] - TOTAL EXCEPT OVARIES, LEIOMYOMA   ANTERIOR CERVICAL DECOMP/DISCECTOMY FUSION N/A 03/11/2023   Procedure: C4-6 ANTERIOR CERVICAL DISCECTOMY AND FUSION;  Surgeon: Clois Fret, MD;  Location: [facility] ORS;  Service: Neurosurgery;  Laterality: N/A;   CARPAL TUNNEL RELEASE Left 02/18/2024   Procedure: Left Side Carpal Tunnel Release with Ultrasound Guidance;  Surgeon: Claudene Penne ORN, MD;  Location: [facility] ORS;  Service: Neurosurgery;  Laterality: Left;   COLONOSCOPY  2006   DX  COLONOSCOPY  2024   fusion on neck  03/2023   c4-c6   LAPAROSCOPY  2000   BURNED HOLES IN OVARIES TO RELEASE PRESSURE FROM CYSTS.  DR. BURNS, [place]   OVARIAN CYST REMOVAL     PILONIDAL CYST EXCISION     TONSILLECTOMY AND ADENOIDECTOMY  1978    Family Psychiatric History: I have reviewed family psychiatric history from progress note on 11/28/2021.  Family History:  Family History  Problem Relation Age of Onset    Diabetes Mother        passed away May 25, 2021   Hypertension Mother    Depression Mother    Stroke Mother    Throat cancer Mother    Alcohol abuse Father    Prostate cancer Father 85   Diabetes Father    Hypertension Father    Bipolar disorder Sister    Bladder Cancer Sister 43       in remission   Lupus Sister    Bipolar disorder Sister    Depression Maternal Aunt    Depression Maternal Uncle    Heart disease Paternal Uncle    Heart disease Maternal Grandmother    Alcohol abuse Maternal Grandmother    Depression Maternal Grandmother    Alcohol abuse Maternal Grandfather    Depression Maternal Grandfather    Bipolar disorder Other     Social History: I have reviewed social history from progress note on 11/28/2021. Social History   Socioeconomic History   Marital status: Married    Spouse name: Viona   Number of children: 1   Years of education: 14   Highest education level: Associate degree: academic program  Occupational History    Employer: [employer]  Tobacco Use   Smoking status: Former    Current packs/day: 0.00    Types: Cigarettes    Start date: 1989    Quit date: 2009    Years since quitting: 17.0   Smokeless tobacco: Never  Vaping Use   Vaping status: Never Used  Substance and Sexual Activity   Alcohol use: Yes    Comment: occasionally once a month   Drug use: No   Sexual activity: Yes    Birth control/protection: Surgical    Comment: Hysterectomy   Other Topics Concern   Not on file  Social History Narrative   Not on file   Social Drivers of Health   Tobacco Use: Medium Risk (03/03/2024)   Patient History    Smoking Tobacco Use: Former    Smokeless Tobacco Use: Never    Passive Exposure: Not on Actuary Strain: Low Risk (01/02/2024)   Overall Financial Resource Strain (CARDIA)    Difficulty of Paying Living Expenses: Not hard at all  Food Insecurity: No Food Insecurity (01/02/2024)   Epic    Worried About Patent Examiner in the Last Year: Never true    Ran Out of Food in the Last Year: Never true  Transportation Needs: No Transportation Needs (01/02/2024)   Epic    Lack of Transportation (Medical): No    Lack of Transportation (Non-Medical): No  Physical Activity: Inactive (01/02/2024)   Exercise Vital Sign    Days of Exercise per Week: 0 days    Minutes of Exercise per Session: Not on file  Stress: No Stress Concern Present (01/02/2024)   [employer] of Occupational Health - Occupational Stress Questionnaire    Feeling of Stress: Only a little  Social Connections: Moderately Integrated (01/02/2024)   Social Connection  and Isolation Panel    Frequency of Communication with Friends and Family: Twice a week    Frequency of Social Gatherings with Friends and Family: Once a week    Attends Religious Services: Never    Database Administrator or Organizations: Yes    Attends Engineer, Structural: More than 4 times per year    Marital Status: Married  Depression (PHQ2-9): Low Risk (03/03/2024)   Depression (PHQ2-9)    PHQ-2 Score: 0  Recent Concern: Depression (PHQ2-9) - Medium Risk (01/06/2024)   Depression (PHQ2-9)    PHQ-2 Score: 8  Alcohol Screen: Low Risk (01/02/2024)   Alcohol Screen    Last Alcohol Screening Score (AUDIT): 2  Housing: Low Risk (01/02/2024)   Epic    Unable to Pay for Housing in the Last Year: No    Number of Times Moved in the Last Year: 0    Homeless in the Last Year: No  Utilities: Not on file  Health Literacy: Not on file    Allergies: Allergies[1]  Metabolic Disorder Labs: Lab Results  Component Value Date   HGBA1C 6.1 01/06/2024   MPG 136.98 03/04/2023   MPG 117 10/25/2015   No results found for: PROLACTIN Lab Results  Component Value Date   CHOL 158 01/06/2024   TRIG 222.0 (H) 01/06/2024   HDL 46.00 01/06/2024   CHOLHDL 3 01/06/2024   VLDL 44.4 (H) 01/06/2024   LDLCALC 68 01/06/2024   LDLCALC 98 07/09/2023   Lab Results   Component Value Date   TSH 2.00 07/09/2023   TSH 2.19 11/08/2021    Therapeutic Level Labs: No results found for: LITHIUM No results found for: VALPROATE No results found for: CBMZ  Current Medications: Current Outpatient Medications  Medication Sig Dispense Refill   ACCU-CHEK GUIDE test strip USE TO CHECK BLOOD SUGAR UP TO 4 TIMES DAILY AS DIRECTED     Accu-Chek Softclix Lancets lancets SMARTSIG:Topical 1-4 Times Daily     acetaminophen  (TYLENOL ) 500 MG tablet Take 500-1,000 mg by mouth every 6 (six) hours as needed for moderate pain (pain score 4-6).     amLODipine  (NORVASC ) 10 MG tablet Take 1 tablet by mouth once daily 90 tablet 3   atorvastatin  (LIPITOR) 10 MG tablet Take 1 tablet by mouth once daily 90 tablet 0   blood glucose meter kit and supplies Dispense based on patient and insurance preference. Use up to four times daily as directed. (FOR ICD-10 E10.9, E11.9). 1 each 0   buPROPion  (WELLBUTRIN ) 75 MG tablet Take 1 tablet (75 mg total) by mouth every morning. 90 tablet 1   losartan  (COZAAR ) 50 MG tablet Take 1 tablet (50 mg total) by mouth daily.     metFORMIN  (GLUCOPHAGE ) 500 MG tablet TAKE 1 TABLET BY MOUTH TWICE DAILY WITH A MEAL (Patient taking differently: Take 500 mg by mouth daily.) 180 tablet 0   metoprolol  tartrate (LOPRESSOR ) 100 MG tablet Take 1 tablet (100 mg total) by mouth once for 1 dose. 1 tablet 0   pantoprazole  (PROTONIX ) 20 MG tablet Take 1 tablet by mouth once daily 90 tablet 1   rOPINIRole  (REQUIP ) 0.25 MG tablet TAKE 1 TABLET BY MOUTH AT BEDTIME 90 tablet 0   Semaglutide  (RYBELSUS ) 3 MG TABS Take 1 tablet (3 mg total) by mouth daily. 90 tablet 0   sertraline  (ZOLOFT ) 100 MG tablet Take 2 tablets by mouth once daily 180 tablet 3   tacrolimus  (PROTOPIC ) 0.1 % ointment APPLY 2 grams TO AFFECTED AREAS  OF RASH TWICE DAILY UNTIL IMPROVED (Patient taking differently: Apply 1 Application topically daily as needed (rash).) 60 g 2   traZODone  (DESYREL ) 50 MG  tablet Take 0.5-1 tablets (25-50 mg total) by mouth at bedtime as needed for sleep. 90 tablet 3   No current facility-administered medications for this visit.     Musculoskeletal: Strength & Muscle Tone: within normal limits Gait & Station: normal Patient leans: N/A  Psychiatric Specialty Exam: Review of Systems  Psychiatric/Behavioral: Negative.      Blood pressure 128/84, pulse 74, temperature (!) 97.1 F (36.2 C), temperature source Temporal, height 5' (1.524 m), weight 229 lb 12.8 oz (104.2 kg).Body mass index is 44.88 kg/m.  General Appearance: Casual  Eye Contact:  Fair  Speech:  Clear and Coherent  Volume:  Normal  Mood:  Euthymic  Affect:  Congruent  Thought Process:  Goal Directed and Descriptions of Associations: Intact  Orientation:  Full (Time, Place, and Person)  Thought Content: Logical   Suicidal Thoughts:  No  Homicidal Thoughts:  No  Memory:  Immediate;   Fair Recent;   Fair Remote;   Fair  Judgement:  Fair  Insight:  Fair  Psychomotor Activity:  Normal  Concentration:  Concentration: Fair and Attention Span: Fair  Recall:  [employer] of Knowledge: Fair  Language: Fair  Akathisia:  No  Handed:  Right  AIMS (if indicated): not done  Assets:  Manufacturing Systems Engineer Desire for Improvement Housing Social Support Transportation  ADL's:  Intact  Cognition: WNL  Sleep:  normal   Screenings: AUDIT    Garment/textile Technologist Visit from 07/07/2023 in [facility] [place] [facility] [place] Office Visit from 10/16/2022 in [facility] Admission (Discharged) from 10/24/2015 in [facility]  Alcohol Use Disorder Identification Test Final Score (AUDIT) 3  4  0   GAD-7    Flowsheet Row Office Visit from 03/03/2024 in [facility] Office Visit from 01/06/2024 in [facility] [place] Office Visit from 07/07/2023 in [facility] [place]  [facility] [place] Office Visit from 05/11/2023 in [facility] Office Visit from 10/16/2022 in [facility]  Total GAD-7 Score 0 2 1 0 2   PHQ2-9    Flowsheet Row Office Visit from 03/03/2024 in [facility] Office Visit from 01/29/2024 in [facility] [place] [facility] Office Visit from 01/06/2024 in [facility] [place] [facility] Office Visit from 07/07/2023 in [facility] [place] [facility] [place] Office Visit from 05/11/2023 in [facility]  PHQ-2 Total Score 0 0 0 0 0  PHQ-9 Total Score -- -- 8 3 --   Flowsheet Row Office Visit from 03/03/2024 in [facility] Video Visit from 12/11/2023 in [facility] Video Visit from 10/16/2023 in [facility]  C-SSRS RISK CATEGORY Moderate Risk Moderate Risk Moderate Risk     Assessment and Plan: Nicole Cervantes is a 49 year old Caucasian female, presented for a follow-up appointment, discussed assessment and plan as noted below.  1. Recurrent major depressive disorder, in full remission Currently reports overall mood symptoms are stable Continue Sertraline  200 mg daily Continue Wellbutrin  75 mg daily  2. Agoraphobia-improving Currently denies concerns Continue Sertraline  200 mg daily  3. Social anxiety disorder-improving Only reports engaging  in social activities and good control of anxiety symptoms Continue Sertraline  200 mg daily  4. Insomnia due to medical condition-improving Currently reports sleep is improving Continue CPAP therapy Continue Trazodone  25 to 50 mg at bedtime as needed.  Follow-up Follow-up in clinic in 4 months or sooner if needed.    Consent: Patient/Guardian gives verbal consent for treatment and assignment of  benefits for services provided during this visit. Patient/Guardian expressed understanding and agreed to proceed.   This note was generated in part or whole with voice recognition software. Voice recognition is usually quite accurate but there are transcription errors that can and very often do occur. I apologize for any typographical errors that were not detected and corrected.    Ason Heslin, MD 03/03/2024, 11:57 AM     [1]  Allergies Allergen Reactions   Chlorhexidine  Gluconate Other (See Comments)    Sensitivity to the wipes only, but not the liquid

## 2024-03-30 ENCOUNTER — Encounter: Admitting: Neurosurgery

## 2024-04-05 ENCOUNTER — Ambulatory Visit: Admitting: Family

## 2024-05-12 ENCOUNTER — Encounter: Admitting: Neurosurgery

## 2024-06-29 ENCOUNTER — Telehealth: Admitting: Psychiatry

## 2024-08-23 ENCOUNTER — Encounter: Admitting: Dermatology
